# Patient Record
Sex: Female | Born: 1945
Health system: Southern US, Community
[De-identification: ages and names within clinical notes are randomized; demographics above are authoritative.]

## PROBLEM LIST (undated history)

## (undated) DIAGNOSIS — L259 Unspecified contact dermatitis, unspecified cause: Secondary | ICD-10-CM

## (undated) DIAGNOSIS — U071 COVID-19: Secondary | ICD-10-CM

## (undated) DIAGNOSIS — D128 Benign neoplasm of rectum: Secondary | ICD-10-CM

## (undated) DIAGNOSIS — E039 Hypothyroidism, unspecified: Secondary | ICD-10-CM

## (undated) DIAGNOSIS — M8580 Other specified disorders of bone density and structure, unspecified site: Secondary | ICD-10-CM

## (undated) DIAGNOSIS — N951 Menopausal and female climacteric states: Secondary | ICD-10-CM

## (undated) DIAGNOSIS — R002 Palpitations: Secondary | ICD-10-CM

## (undated) DIAGNOSIS — M949 Disorder of cartilage, unspecified: Secondary | ICD-10-CM

## (undated) DIAGNOSIS — M21619 Bunion of unspecified foot: Secondary | ICD-10-CM

## (undated) DIAGNOSIS — M899 Disorder of bone, unspecified: Secondary | ICD-10-CM

## (undated) DIAGNOSIS — M810 Age-related osteoporosis without current pathological fracture: Secondary | ICD-10-CM

## (undated) DIAGNOSIS — E875 Hyperkalemia: Secondary | ICD-10-CM

## (undated) DIAGNOSIS — Z86010 Personal history of colonic polyps: Secondary | ICD-10-CM

## (undated) DIAGNOSIS — H269 Unspecified cataract: Secondary | ICD-10-CM

## (undated) DIAGNOSIS — R9389 Abnormal findings on diagnostic imaging of other specified body structures: Secondary | ICD-10-CM

## (undated) DIAGNOSIS — N393 Stress incontinence (female) (male): Secondary | ICD-10-CM

## (undated) DIAGNOSIS — D129 Benign neoplasm of anus and anal canal: Secondary | ICD-10-CM

## (undated) DIAGNOSIS — T7840XA Allergy, unspecified, initial encounter: Secondary | ICD-10-CM

## (undated) DIAGNOSIS — M1990 Unspecified osteoarthritis, unspecified site: Secondary | ICD-10-CM

## (undated) DIAGNOSIS — D649 Anemia, unspecified: Secondary | ICD-10-CM

## (undated) DIAGNOSIS — E785 Hyperlipidemia, unspecified: Secondary | ICD-10-CM

## (undated) DIAGNOSIS — K219 Gastro-esophageal reflux disease without esophagitis: Secondary | ICD-10-CM

## (undated) HISTORY — PX: FRACTURE SURGERY: SHX138

## (undated) HISTORY — DX: Benign neoplasm of rectum: D12.8

## (undated) HISTORY — DX: Disorder of cartilage, unspecified: M94.9

## (undated) HISTORY — PX: POLYPECTOMY: SHX149

## (undated) HISTORY — DX: Hypothyroidism, unspecified: E03.9

## (undated) HISTORY — DX: Stress incontinence (female) (male): N39.3

## (undated) HISTORY — DX: Unspecified contact dermatitis, unspecified cause: L25.9

## (undated) HISTORY — DX: Bunion of unspecified foot: M21.619

## (undated) HISTORY — PX: EYE SURGERY: SHX253

## (undated) HISTORY — DX: Unspecified osteoarthritis, unspecified site: M19.90

## (undated) HISTORY — DX: Palpitations: R00.2

## (undated) HISTORY — DX: Hyperlipidemia, unspecified: E78.5

## (undated) HISTORY — DX: Benign neoplasm of anus and anal canal: D12.9

## (undated) HISTORY — DX: Disorder of bone, unspecified: M89.9

## (undated) HISTORY — DX: Unspecified cataract: H26.9

## (undated) HISTORY — PX: OTHER SURGICAL HISTORY: SHX169

## (undated) HISTORY — DX: Anemia, unspecified: D64.9

## (undated) HISTORY — DX: Hyperkalemia: E87.5

## (undated) HISTORY — DX: Menopausal and female climacteric states: N95.1

## (undated) HISTORY — DX: Allergy, unspecified, initial encounter: T78.40XA

## (undated) HISTORY — DX: Gastro-esophageal reflux disease without esophagitis: K21.9

## (undated) HISTORY — DX: Abnormal findings on diagnostic imaging of other specified body structures: R93.89

## (undated) HISTORY — DX: Personal history of colonic polyps: Z86.010

## (undated) HISTORY — PX: HAND SURGERY: SHX662

## (undated) HISTORY — DX: Other specified disorders of bone density and structure, unspecified site: M85.80

## (undated) HISTORY — PX: COLONOSCOPY: SHX174

## (undated) HISTORY — DX: Age-related osteoporosis without current pathological fracture: M81.0

---

## 1986-10-18 HISTORY — PX: ABDOMINAL HYSTERECTOMY: SHX81

## 2004-10-03 ENCOUNTER — Encounter: Payer: Self-pay | Admitting: Family Medicine

## 2005-02-09 ENCOUNTER — Encounter: Admission: RE | Admit: 2005-02-09 | Discharge: 2005-02-09 | Payer: Self-pay | Admitting: Internal Medicine

## 2005-03-18 HISTORY — PX: FRACTURE SURGERY: SHX138

## 2005-03-18 HISTORY — PX: OPEN REDUCTION INTERNAL FIXATION (ORIF) DISTAL RADIAL FRACTURE: SHX5989

## 2005-03-25 ENCOUNTER — Ambulatory Visit (HOSPITAL_COMMUNITY): Admission: RE | Admit: 2005-03-25 | Discharge: 2005-03-26 | Payer: Self-pay | Admitting: Orthopedic Surgery

## 2005-09-21 ENCOUNTER — Other Ambulatory Visit: Admission: RE | Admit: 2005-09-21 | Discharge: 2005-09-21 | Payer: Self-pay | Admitting: Obstetrics and Gynecology

## 2006-02-15 ENCOUNTER — Encounter: Admission: RE | Admit: 2006-02-15 | Discharge: 2006-02-15 | Payer: Self-pay | Admitting: Obstetrics and Gynecology

## 2006-09-26 ENCOUNTER — Encounter: Payer: Self-pay | Admitting: Family Medicine

## 2006-09-29 ENCOUNTER — Ambulatory Visit: Payer: Self-pay | Admitting: Cardiology

## 2007-02-23 ENCOUNTER — Encounter: Admission: RE | Admit: 2007-02-23 | Discharge: 2007-02-23 | Payer: Self-pay | Admitting: Obstetrics and Gynecology

## 2007-03-01 LAB — CONVERTED CEMR LAB: Pap Smear: NORMAL

## 2007-07-18 ENCOUNTER — Ambulatory Visit: Payer: Self-pay | Admitting: Family Medicine

## 2007-07-18 DIAGNOSIS — E039 Hypothyroidism, unspecified: Secondary | ICD-10-CM

## 2007-07-18 HISTORY — DX: Hypothyroidism, unspecified: E03.9

## 2007-08-30 ENCOUNTER — Ambulatory Visit: Payer: Self-pay | Admitting: Family Medicine

## 2007-10-19 HISTORY — PX: RETINAL DETACHMENT SURGERY: SHX105

## 2008-02-07 ENCOUNTER — Other Ambulatory Visit: Admission: RE | Admit: 2008-02-07 | Discharge: 2008-02-07 | Payer: Self-pay | Admitting: Family Medicine

## 2008-02-07 ENCOUNTER — Ambulatory Visit: Payer: Self-pay | Admitting: Family Medicine

## 2008-02-07 ENCOUNTER — Encounter: Payer: Self-pay | Admitting: Family Medicine

## 2008-02-07 DIAGNOSIS — N951 Menopausal and female climacteric states: Secondary | ICD-10-CM

## 2008-02-07 DIAGNOSIS — R002 Palpitations: Secondary | ICD-10-CM | POA: Insufficient documentation

## 2008-02-07 DIAGNOSIS — M21619 Bunion of unspecified foot: Secondary | ICD-10-CM

## 2008-02-07 HISTORY — DX: Menopausal and female climacteric states: N95.1

## 2008-02-08 ENCOUNTER — Encounter: Payer: Self-pay | Admitting: Family Medicine

## 2008-02-08 ENCOUNTER — Encounter (INDEPENDENT_AMBULATORY_CARE_PROVIDER_SITE_OTHER): Payer: Self-pay | Admitting: *Deleted

## 2008-02-13 ENCOUNTER — Encounter (INDEPENDENT_AMBULATORY_CARE_PROVIDER_SITE_OTHER): Payer: Self-pay | Admitting: *Deleted

## 2008-02-14 ENCOUNTER — Encounter (INDEPENDENT_AMBULATORY_CARE_PROVIDER_SITE_OTHER): Payer: Self-pay | Admitting: *Deleted

## 2008-02-22 ENCOUNTER — Encounter (INDEPENDENT_AMBULATORY_CARE_PROVIDER_SITE_OTHER): Payer: Self-pay | Admitting: *Deleted

## 2008-02-22 ENCOUNTER — Ambulatory Visit: Payer: Self-pay | Admitting: Family Medicine

## 2008-02-22 ENCOUNTER — Ambulatory Visit: Payer: Self-pay

## 2008-02-22 LAB — CONVERTED CEMR LAB: OCCULT 1: NEGATIVE

## 2008-03-06 ENCOUNTER — Encounter: Payer: Self-pay | Admitting: Family Medicine

## 2008-03-13 ENCOUNTER — Encounter: Admission: RE | Admit: 2008-03-13 | Discharge: 2008-03-13 | Payer: Self-pay | Admitting: Family Medicine

## 2008-03-13 ENCOUNTER — Encounter: Payer: Self-pay | Admitting: Family Medicine

## 2008-03-24 DIAGNOSIS — M899 Disorder of bone, unspecified: Secondary | ICD-10-CM | POA: Insufficient documentation

## 2008-03-24 DIAGNOSIS — M949 Disorder of cartilage, unspecified: Secondary | ICD-10-CM

## 2008-03-24 HISTORY — DX: Disorder of bone, unspecified: M89.9

## 2008-03-25 ENCOUNTER — Encounter (INDEPENDENT_AMBULATORY_CARE_PROVIDER_SITE_OTHER): Payer: Self-pay | Admitting: *Deleted

## 2008-03-25 ENCOUNTER — Telehealth (INDEPENDENT_AMBULATORY_CARE_PROVIDER_SITE_OTHER): Payer: Self-pay | Admitting: *Deleted

## 2008-07-31 ENCOUNTER — Ambulatory Visit: Payer: Self-pay | Admitting: Family Medicine

## 2009-03-25 ENCOUNTER — Encounter: Admission: RE | Admit: 2009-03-25 | Discharge: 2009-03-25 | Payer: Self-pay | Admitting: Family Medicine

## 2009-05-06 ENCOUNTER — Ambulatory Visit: Payer: Self-pay | Admitting: Family Medicine

## 2009-05-06 LAB — CONVERTED CEMR LAB
Protein, U semiquant: NEGATIVE
Urobilinogen, UA: NEGATIVE
WBC Urine, dipstick: NEGATIVE

## 2009-05-07 ENCOUNTER — Encounter: Payer: Self-pay | Admitting: Family Medicine

## 2009-05-15 ENCOUNTER — Encounter (INDEPENDENT_AMBULATORY_CARE_PROVIDER_SITE_OTHER): Payer: Self-pay | Admitting: *Deleted

## 2009-05-18 LAB — CONVERTED CEMR LAB
ALT: 17 units/L (ref 0–35)
AST: 25 units/L (ref 0–37)
Alkaline Phosphatase: 61 units/L (ref 39–117)
Basophils Relative: 0.5 % (ref 0.0–3.0)
Bilirubin, Direct: 0.1 mg/dL (ref 0.0–0.3)
Chloride: 107 meq/L (ref 96–112)
Creatinine, Ser: 0.8 mg/dL (ref 0.4–1.2)
Direct LDL: 184.8 mg/dL
Eosinophils Relative: 2 % (ref 0.0–5.0)
HCT: 36.6 % (ref 36.0–46.0)
MCV: 93.5 fL (ref 78.0–100.0)
Monocytes Absolute: 0.3 10*3/uL (ref 0.1–1.0)
Monocytes Relative: 5 % (ref 3.0–12.0)
Neutrophils Relative %: 62.1 % (ref 43.0–77.0)
Potassium: 4.4 meq/L (ref 3.5–5.1)
RBC: 3.91 M/uL (ref 3.87–5.11)
Total CHOL/HDL Ratio: 3
Total Protein: 7.6 g/dL (ref 6.0–8.3)
VLDL: 13.2 mg/dL (ref 0.0–40.0)
WBC: 5.1 10*3/uL (ref 4.5–10.5)

## 2009-06-03 ENCOUNTER — Ambulatory Visit: Payer: Self-pay | Admitting: Family Medicine

## 2009-06-03 LAB — CONVERTED CEMR LAB: OCCULT 3: NEGATIVE

## 2009-06-05 ENCOUNTER — Encounter (INDEPENDENT_AMBULATORY_CARE_PROVIDER_SITE_OTHER): Payer: Self-pay | Admitting: *Deleted

## 2009-10-18 HISTORY — PX: CATARACT EXTRACTION: SUR2

## 2009-11-26 ENCOUNTER — Ambulatory Visit: Payer: Self-pay | Admitting: Family Medicine

## 2009-11-26 DIAGNOSIS — L259 Unspecified contact dermatitis, unspecified cause: Secondary | ICD-10-CM

## 2009-12-01 LAB — CONVERTED CEMR LAB: TSH: 1.98 microintl units/mL (ref 0.35–5.50)

## 2010-04-06 ENCOUNTER — Encounter: Admission: RE | Admit: 2010-04-06 | Discharge: 2010-04-06 | Payer: Self-pay | Admitting: Family Medicine

## 2010-09-21 ENCOUNTER — Ambulatory Visit: Payer: Self-pay | Admitting: Family Medicine

## 2010-09-21 ENCOUNTER — Other Ambulatory Visit
Admission: RE | Admit: 2010-09-21 | Discharge: 2010-09-21 | Payer: Self-pay | Source: Home / Self Care | Admitting: Family Medicine

## 2010-09-21 ENCOUNTER — Encounter: Payer: Self-pay | Admitting: Family Medicine

## 2010-09-23 ENCOUNTER — Ambulatory Visit: Payer: Self-pay | Admitting: Family Medicine

## 2010-09-23 ENCOUNTER — Telehealth: Payer: Self-pay | Admitting: Family Medicine

## 2010-09-30 ENCOUNTER — Encounter: Payer: Self-pay | Admitting: Family Medicine

## 2010-10-02 ENCOUNTER — Telehealth (INDEPENDENT_AMBULATORY_CARE_PROVIDER_SITE_OTHER): Payer: Self-pay | Admitting: *Deleted

## 2010-10-02 LAB — CONVERTED CEMR LAB
Chloride: 106 meq/L (ref 96–112)
GFR calc non Af Amer: 100.39 mL/min (ref >60.00)
Pap Smear: NEGATIVE
Potassium: 5.4 meq/L — ABNORMAL HIGH (ref 3.5–5.1)
Sodium: 142 meq/L (ref 135–145)
Vit D, 25-Hydroxy: 49 ng/mL (ref 30–89)

## 2010-10-09 ENCOUNTER — Encounter: Payer: Self-pay | Admitting: Family Medicine

## 2010-10-14 ENCOUNTER — Ambulatory Visit: Payer: Self-pay | Admitting: Family Medicine

## 2010-10-14 DIAGNOSIS — E875 Hyperkalemia: Secondary | ICD-10-CM | POA: Insufficient documentation

## 2010-10-15 ENCOUNTER — Telehealth (INDEPENDENT_AMBULATORY_CARE_PROVIDER_SITE_OTHER): Payer: Self-pay | Admitting: *Deleted

## 2010-10-15 LAB — CONVERTED CEMR LAB: Potassium: 5.5 meq/L — ABNORMAL HIGH (ref 3.5–5.1)

## 2010-10-20 ENCOUNTER — Telehealth (INDEPENDENT_AMBULATORY_CARE_PROVIDER_SITE_OTHER): Payer: Self-pay | Admitting: *Deleted

## 2010-10-27 ENCOUNTER — Other Ambulatory Visit: Payer: Self-pay | Admitting: Family Medicine

## 2010-10-27 ENCOUNTER — Ambulatory Visit
Admission: RE | Admit: 2010-10-27 | Discharge: 2010-10-27 | Payer: Self-pay | Source: Home / Self Care | Attending: Family Medicine | Admitting: Family Medicine

## 2010-10-27 DIAGNOSIS — E785 Hyperlipidemia, unspecified: Secondary | ICD-10-CM | POA: Insufficient documentation

## 2010-10-27 DIAGNOSIS — N393 Stress incontinence (female) (male): Secondary | ICD-10-CM | POA: Insufficient documentation

## 2010-10-27 HISTORY — DX: Hyperlipidemia, unspecified: E78.5

## 2010-10-27 LAB — CONVERTED CEMR LAB
Bilirubin Urine: NEGATIVE
Glucose, Urine, Semiquant: NEGATIVE
pH: 7

## 2010-10-27 LAB — BASIC METABOLIC PANEL
BUN: 16 mg/dL (ref 6–23)
CO2: 29 mEq/L (ref 19–32)
Calcium: 9.6 mg/dL (ref 8.4–10.5)
Chloride: 103 mEq/L (ref 96–112)
Creatinine, Ser: 0.7 mg/dL (ref 0.4–1.2)
GFR: 91.9 mL/min (ref >60.00)
Glucose, Bld: 85 mg/dL (ref 70–99)
Potassium: 5.2 mEq/L — ABNORMAL HIGH (ref 3.5–5.1)
Sodium: 141 mEq/L (ref 135–145)

## 2010-11-02 ENCOUNTER — Encounter: Payer: Self-pay | Admitting: Family Medicine

## 2010-11-02 ENCOUNTER — Ambulatory Visit: Admission: RE | Admit: 2010-11-02 | Discharge: 2010-11-02 | Payer: Self-pay | Source: Home / Self Care

## 2010-11-02 ENCOUNTER — Ambulatory Visit (HOSPITAL_COMMUNITY)
Admission: RE | Admit: 2010-11-02 | Discharge: 2010-11-02 | Payer: Self-pay | Source: Home / Self Care | Attending: Family Medicine | Admitting: Family Medicine

## 2010-11-02 DIAGNOSIS — R9389 Abnormal findings on diagnostic imaging of other specified body structures: Secondary | ICD-10-CM | POA: Insufficient documentation

## 2010-11-15 LAB — CONVERTED CEMR LAB
Albumin: 4.3 g/dL (ref 3.5–5.2)
BUN: 14 mg/dL (ref 6–23)
Bilirubin Urine: NEGATIVE
Calcium: 9.7 mg/dL (ref 8.4–10.5)
Cholesterol: 279 mg/dL (ref 0–200)
Eosinophils Relative: 1.1 % (ref 0.0–5.0)
GFR calc Af Amer: 130 mL/min
Glucose, Bld: 82 mg/dL (ref 70–99)
Glucose, Urine, Semiquant: NEGATIVE
HCT: 38.4 % (ref 36.0–46.0)
Hemoglobin: 12.8 g/dL (ref 12.0–15.0)
MCV: 94.9 fL (ref 78.0–100.0)
Monocytes Absolute: 0.5 10*3/uL (ref 0.1–1.0)
Monocytes Relative: 11 % (ref 3.0–12.0)
Neutro Abs: 2.7 10*3/uL (ref 1.4–7.7)
RDW: 12.3 % (ref 11.5–14.6)
Sodium: 141 meq/L (ref 135–145)
Specific Gravity, Urine: <1.005
TSH: 2.23 microintl units/mL (ref 0.35–5.50)
Total CHOL/HDL Ratio: 2.8
Total Protein: 7.6 g/dL (ref 6.0–8.3)
Urobilinogen, UA: NEGATIVE

## 2010-11-17 NOTE — Assessment & Plan Note (Signed)
Summary: CPX//PH   Vital Signs:  Patient profile:   65 year old female Menstrual status:  hysterectomy Height:      64 inches Weight:      140.0 pounds BMI:     24.12 Temp:     98.1 degrees F oral Pulse rate:   60 / minute Pulse rhythm:   regular BP sitting:   112 / 70  (right arm) Cuff size:   regular  Vitals Entered By: Almeta Monas CMA Duncan Dull) October 15, 2010 1:12 PM) CC:  CPX with pap--wants to discuss constipation  Does patient need assistance? Functional Status Self care, Cook/clean, Shopping, Social activities Ambulation Normal Comments pt is able to do all ADLs and can read and write.   Vision Screening:      Vision Comments: optho---q1y 40db HL: Left  Right  Audiometry Comment: grossly normal      Menstrual Status hysterectomy Last PAP Result Normal   History of Present Illness: Pt here for cpe and pap.  Pt c/o chronic constipation.  Pt is drinking a lot of water and is eating prunes,  fiber etc.   Pt states BM are like pellets.     Preventive Screening-Counseling & Management  Alcohol-Tobacco     Alcohol drinks/day: <1     Alcohol type: WINE -OCCASIONALLY     Smoking Status: never     Passive Smoke Exposure: no  Caffeine-Diet-Exercise     Caffeine use/day: 1-1 1/2     Does Patient Exercise: yes     Type of exercise: walking     Exercise (avg: min/session): 30-60     Times/week: 3  Hep-HIV-STD-Contraception     HIV Risk: no     Dental Visit-last 6 months yes     Dental Care Counseling: not indicated; dental care within six months     SBE monthly: yes  Safety-Violence-Falls     Seat Belt Use: yes     Firearms in the Home: no firearms in the home     Smoke Detectors: yes     Violence in the Home: no risk noted     Sexual Abuse: no     Fall Risk: no      Sexual History:  currently monogamous.        Drug Use:  never.    Problems Prior to Update: 1)  Dermatitis  (ICD-692.9) 2)  Eczema  (ICD-692.9) 3)  Osteopenia  (ICD-733.90) 4)   Preventive Health Care  (ICD-V70.0) 5)  Postmenopausal Status  (ICD-627.2) 6)  Preventive Health Care  (ICD-V70.0) 7)  Palpitations, Occasional  (ICD-785.1) 8)  Bunion, Right Foot  (ICD-727.1) 9)  Family History Osteoporosis  (ICD-V17.8) 10)  Family History Diabetes 1st Degree Relative  (ICD-V18.0) 11)  Family History Breast Cancer 1st Degree Relative <50  (ICD-V16.3) 12)  Hypothyroidism  (ICD-244.9)  Medications Prior to Update: 1)  Levothroid 50 Mcg  Tabs (Levothyroxine Sodium) .Marland Kitchen.. 1 By Mouth Once Daily  Current Medications (verified): 1)  Levothroid 50 Mcg  Tabs (Levothyroxine Sodium) .Marland Kitchen.. 1 By Mouth Once Daily  Allergies (verified): 1)  ! Sulfa  Past History:  Past Surgical History: Last updated: 05/06/2009 Hysterectomy:1986 2 NATURAL BIRTHS-69' 72' LEFT WRIST SURGERY 2006 retina surgery L eye -- 01/23/2009--[facility]  Family History: Last updated: 2010-10-15 Mother : deceased  60 yo  FATHER:DECEASED 1 BROTHER:LIVING HEART: FATHER-OPEN-HEART, UNCLES (MOTHER'S SIDE) , MOTHER, MGM,MGF,PGF Family History Breast cancer 1st degree relative <50: 2 AUNTS (MOTHER'S SISTER) Family History Lung cancer: AUNT-MOTHER'S SIDE Family  History Diabetes 1st degree relative: MOTHER, 2 AUNTS (MOTHER'S SIDE) Family History Hypertension: MOTHER Family History High cholesterol:MOTHER Family History Thyroid disease:MOTHER Family History Osteoporosis: AUNT MOTHER'S SIDE M-- dementia Family History of Stroke F 1st degree relative  Social History: Last updated: 07/18/2007 Retired--teacher and Production designer, theatre/television/film Married Never Smoked Alcohol use-yes Drug use-no Regular exercise-yes  Risk Factors: Alcohol Use: <1 (09/21/2010) Caffeine Use: 1-1 1/2 (09/21/2010) Exercise: yes (09/21/2010)  Risk Factors: Smoking Status: never (09/21/2010) Passive Smoke Exposure: no (09/21/2010)  Past Medical History: Reviewed history from 03/24/2008 and no changes  required. Hypothyroidism Osteopenia  Family History: Reviewed history from 05/06/2009 and no changes required. 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 Eyes:  Denies blurring, discharge, double vision, eye irritation, eye pain, halos, itching, light sensitivity, red eye, vision loss-1 eye, and vision loss-both eyes. ENT:  Denies decreased hearing, difficulty swallowing, ear discharge, earache, hoarseness, nasal congestion, nosebleeds, postnasal drainage, ringing in ears, sinus pressure, and sore throat. CV:  Denies bluish discoloration of lips or nails, chest pain or discomfort, difficulty breathing at night, difficulty breathing while lying down, fainting, fatigue, leg cramps with exertion, lightheadness, near fainting, palpitations, shortness of breath with exertion, swelling of feet, swelling of hands, and weight gain. Resp:  Denies chest discomfort, chest pain with inspiration, cough, coughing up blood, excessive snoring, hypersomnolence, morning headaches, pleuritic, shortness of breath, sputum productive, and wheezing. GI:   Complains of constipation; denies abdominal pain, bloody stools, change in bowel habits, dark tarry stools, diarrhea, excessive appetite, gas, hemorrhoids, indigestion, loss of appetite, nausea, vomiting, vomiting blood, and yellowish skin color. GU:  Denies abnormal vaginal bleeding, decreased libido, discharge, dysuria, genital sores, hematuria, incontinence, nocturia, urinary frequency, and urinary hesitancy. MS:  Complains of joint pain; denies joint redness, joint swelling, loss of strength, low back pain, mid back pain, muscle aches, muscle , cramps, muscle weakness, stiffness, and thoracic pain; bunions R foot. Derm:  Denies changes in color of skin, changes in nail beds, dryness, excessive perspiration, flushing, hair loss, insect bite(s), itching, lesion(s), poor wound healing, and rash. Neuro:  Denies brief paralysis, difficulty with concentration, disturbances in coordination, falling down, headaches, inability to speak, memory loss, numbness, poor balance, seizures, sensation of room spinning, tingling, tremors, visual disturbances, and weakness. Psych:  Denies alternate hallucination ( auditory/visual), anxiety, depression, easily angered, easily tearful, irritability, mental problems, panic attacks, sense of great danger, suicidal thoughts/plans, thoughts of violence, unusual visions or sounds, and thoughts /plans of harming others. Endo:  Denies cold intolerance, excessive hunger, excessive thirst, excessive urination, heat intolerance, polyuria, and weight change. Heme:  Denies abnormal bruising, bleeding, enlarge lymph nodes, fevers, pallor, and skin discoloration. Allergy:  Denies hives or rash, itching eyes, persistent infections, seasonal allergies, and sneezing.  Physical Exam  General:  Well-developed,well-nourished,in no acute distress;  alert,appropriate and cooperative throughout examination Head:  Normocephalic and atraumatic without obvious abnormalities. No apparent alopecia  or balding. Eyes:  pupils equal, pupils round, pupils reactive to light, and no injection.   Ears:  External ear exam shows no significant lesions or deformities.  Otoscopic examination reveals clear canals, tympanic membranes are intact bilaterally without bulging, retraction, inflammation or discharge. Hearing is grossly normal bilaterally. Nose:  External nasal examination shows no deformity or inflammation. Nasal mucosa are pink and moist without lesions or exudates. Mouth:  Oral mucosa and oropharynx without lesions or exudates.  Teeth in good repair. Neck:  No deformities, masses, or tenderness noted. Chest Wall:  No deformities, masses, or tenderness noted. Breasts:  No mass, nodules, thickening, tenderness, bulging, retraction, inflamation, nipple discharge or skin changes noted.   Lungs:  Normal respiratory effort, chest expands symmetrically. Lungs are clear to auscultation, no crackles or wheezes. Heart:  normal rate and no murmur.   Abdomen:  Bowel sounds positive,abdomen soft and non-tender without masses, organomegaly or hernias noted. Rectal:  No external abnormalities noted. Normal sphincter tone. No rectal masses or tenderness. Genitalia:  normal introitus, no external lesions, no vaginal discharge, mucosa pink and moist, no friaility or hemorrhage, and no adnexal masses or tenderness.   vaginal smear done Msk:  normal ROM, no joint tenderness, no joint swelling, no joint warmth, no redness over joints, no joint deformities, no joint instability, no crepitation, and no muscle atrophy.   Pulses:  R and L carotid,radial,femoral,dorsalis pedis and posterior tibial pulses are full and equal bilaterally Extremities:  No clubbing, cyanosis, edema, or deformity noted with normal full range of motion of all joints.   Neurologic:  No cranial nerve deficits noted. Station and gait are normal. Plantar reflexes are down-going bilaterally. DTRs are symmetrical throughout. Sensory, motor and  coordinative functions appear intact. Skin:  Intact without suspicious lesions or rashes Cervical Nodes:  No lymphadenopathy noted Axillary Nodes:  No palpable lymphadenopathy Psych:  Cognition and judgment appear intact. Alert and cooperative with normal attention span and concentration. No apparent delusions, illusions, hallucinations   Impression & Recommendations:  Problem # 1:  PREVENTIVE HEALTH CARE (ICD-V70.0)  Ghm utd  check fasting labs  Orders: Medicare -1st Annual Wellness Visit [id number]) EKG w/ Interpretation (93000)  Problem # 2:  OSTEOPENIA (ICD-733.90)  Vit D:38 (05/06/2009)  Orders: Medicare -1st Annual Wellness Visit [phone]) EKG w/ Interpretation (93000)  Problem # 3:  HYPOTHYROIDISM (ICD-244.9)  Her updated medication list for this problem includes:    Levothroid 50 Mcg Tabs (Levothyroxine sodium) .Marland Kitchen... 1 by mouth once daily  Labs Reviewed: TSH: 1.98 (11/26/2009)    Chol: 300 (05/06/2009)   HDL: 96.80 (05/06/2009)   LDL: DEL (02/07/2008)   TG: 66.0 (05/06/2009)  Orders: Medicare -1st Annual Wellness Visit [phone]) EKG w/ Interpretation (93000)  Complete Medication List: 1)  Levothroid 50 Mcg Tabs (Levothyroxine sodium) .Marland Kitchen.. 1 by mouth once daily  Other Orders: Pneumococcal Vaccine (95621) Admin 1st Vaccine (30865)  Patient Instructions: 1)  V70.0 ,  244.9, 272.4 [facility], [facility],   osteopenia --[facility] D   Orders Added: 1)  Pneumococcal Vaccine [90732] 2)  Admin 1st Vaccine [90471] 3)  Medicare -1st Annual Wellness Visit [G0438] 4)  EKG w/ Interpretation [93000]   Immunizations Administered:  Pneumonia Vaccine:    Vaccine Type: Pneumovax    Site: left deltoid    Mfr: Merck    Dose: 0.5 ml    Route: IM    Given by: Almeta Monas  CMA (AAMA)    Exp. Date: 02/17/2012    Lot #: [id number]    VIS given: 09/22/09 version given September 21, 2010.   Immunizations Administered:  Pneumonia Vaccine:    Vaccine Type: Pneumovax    Site: left  deltoid    Mfr: Merck    Dose: 0.5 ml    Route: IM    Given by: Almeta Monas CMA (AAMA)    Exp. Date: 02/17/2012    Lot #: [id number]    VIS given: 09/22/09 version given September 21, 2010.  Last Flu Vaccine:  Fluvax 3+ (07/31/2008 9:13:36 AM) Flu Vaccine Result Date:  09/02/2010 Flu Vaccine Result:  given Flu Vaccine Next Due:  1 yr Last Mammogram:  ASSESSMENT: Negative - BI-RADS 1^MM DIGITAL SCREENING (04/06/2010 11:29:00 AM) Mammogram Result Date:  04/06/2010 Mammogram Result:  normal Mammogram Next Due:  1 yr        Past Medical History:    Reviewed history from 03/24/2008 and no changes required:       Hypothyroidism       Osteopenia    Laboratory Results

## 2010-11-17 NOTE — Progress Notes (Signed)
----   Converted from flag ---- ---- 09/21/2010 2:06 PM, Almeta Monas CMA (AAMA) wrote: stool card neg  ---- 09/21/2010 1:42 PM, Loreen Freud DO wrote: pneumonia vaccine ------------------------------

## 2010-11-17 NOTE — Assessment & Plan Note (Signed)
Summary: FOR A SPOT ON HER FACE//PH   Vital Signs:  Patient profile:   65 year old female Weight:      136 pounds Temp:     98.1 degrees F oral Pulse rate:   60 / minute Pulse rhythm:   regular BP sitting:   122 / 80  (left arm) Cuff size:   regular  Vitals Entered By: Army Fossa CMA (November 26, 2009 10:47 AM) CC: Pt c/o dry, chapped spot on her face that seems to be spreading x 2 weeks. , Rash   History of Present Illness:  Rash      This is a 65 year old woman who presents with Rash.  The symptoms began 3 weeks ago.  The patient complains of macules and itching, but denies papules, nodules, hives, welts, pustules, blisters, ulcers, scaling, weeping, oozing, redness, increased warmth, and tenderness.  The rash is located on the face.  The rash is better with heat.  The patient denies the following symptoms: fever, headache, facial swelling, tongue swelling, burning, difficulty breathing, abdominal pain, nausea, vomiting, diarrhea, dizziness, sore throat, dysuria, eye symptoms, arthralgias, and vaginal discharge.  The patient denies history of recent tick bite, recent tick exposure, other insect bite, recent infection, recent antibiotic use, new medication, new clothing, new topical exposure, recent travel, pet/animal contact, thyroid disease, chronic liver disease, autoimmune disease, chronic edema, and prior STD.  Pt has used neosporin and H202 with no relief.   Pt has appoint with Derm but it is not until March.    Current Medications (verified): 1)  Levothroid 50 Mcg  Tabs (Levothyroxine Sodium) .Marland Kitchen.. 1 By Mouth Once Daily 2)  Elocon 0.1 % Crea (Mometasone Furoate) .... Apply Once Daily  Allergies: 1)  ! Sulfa  Past History:  Past medical, surgical, family and social histories (including risk factors) reviewed for relevance to current acute and chronic problems.  Past Medical History: Reviewed history from 03/24/2008 and no changes  required. Hypothyroidism Osteopenia  Past Surgical History: Reviewed history from 05/06/2009 and no changes required. Hysterectomy:1986 2 NATURAL BIRTHS-69' 72' LEFT WRIST SURGERY 2006 retina surgery L eye -- 01/23/2009--[facility]  Family History: Reviewed history from 05/06/2009 and no changes required. 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     Impression & Recommendations:  Problem # 1:  DERMATITIS (ICD-692.9)  Her updated medication list for this problem includes:    Elocon 0.1 % Crea (Mometasone furoate) .Marland Kitchen... Apply once daily  Discussed avoidance of triggers and symptomatic treatment.   Problem # 2:  HYPOTHYROIDISM (ICD-244.9)  Her updated medication list for this problem includes:    Levothroid 50 Mcg Tabs (Levothyroxine sodium) .Marland Kitchen... 1 by mouth once daily  Orders: Venipuncture (47425) TLB-TSH (Thyroid Stimulating Hormone) (84443-TSH)  Complete Medication List: 1)  Levothroid 50  Mcg Tabs (Levothyroxine sodium) .Marland Kitchen.. 1 by mouth once daily 2)  Elocon 0.1 % Crea  (Mometasone furoate) .... Apply once daily Prescriptions: ELOCON 0.1 % CREA (MOMETASONE FUROATE) apply once daily  #45g x 1   Entered and Authorized by:   Loreen Freud DO   Signed by:   Loreen Freud DO on 11/26/2009   Method used:   Electronically to        [id number] (retail)       [address]       [place], [place]  [address]       Ph: [phone] or [phone]       Fax: [phone]   RxID:   [id number]   Prevention & Chronic Care Immunizations   Influenza vaccine: Fluvax 3+  (07/31/2008)   Influenza vaccine due: 07/31/2009    Tetanus booster: 02/07/2008: Td   Tetanus booster due: 02/06/2018    Pneumococcal vaccine: Not documented    H. zoster vaccine: 02/07/2008: Zostavax  Colorectal Screening   Hemoccult: Not documented    Colonoscopy: Normal  (12/22/2001)   Colonoscopy due: 12/23/2011  Other Screening   Pap smear: Normal  (03/01/2007)   Pap smear due: 02/29/2008    Mammogram: ASSESSMENT: Negative - BI-RADS 1^MM DIGITAL SCREENING  (03/25/2009)   Mammogram due: 03/25/2010    DXA bone density scan: Not documented   Smoking status: never  (05/06/2009)  Lipids   Total Cholesterol: 300  (05/06/2009)   LDL: DEL  (02/07/2008)   LDL Direct: 184.8  (05/06/2009)   HDL: 96.80  (05/06/2009)   Triglycerides: 66.0  (05/06/2009)

## 2010-11-19 NOTE — Progress Notes (Signed)
Summary: Results  Phone Note Outgoing Call   Caller: Patient Call placed by: Almeta Monas CMA Duncan Dull),  October 15, 2010 4:29 PM Call placed to: Patient Details for Reason: please make sure pt is not taking any supplements with potassium in it and that sample did not sit for long.     Summary of Call: spoke with patient and advised of the above, advise to also be mindful of foods that are high in potassium and we will check and make sure this is not a lab error..... Almeta Monas CMA Duncan Dull)  October 15, 2010 4:34 PM'

## 2010-11-19 NOTE — Progress Notes (Signed)
Summary: Results  Phone Note Outgoing Call   Call placed by: Almeta Monas CMA Duncan Dull),  October 02, 2010 5:03 PM Call placed to: Patient Details for Reason: normal---pt should be taking calcium 1200-1500 mg daily with 1000u vita D3 daily.  is pt taking any otc K supplement?  K slightly high---recheck 2 weeks   Summary of Call: Left message to call back.... Almeta Monas CMA Duncan Dull)  October 02, 2010 5:04 PM  Discuss with patient, appt scheduled...........Marland KitchenFelecia Deloach CMA  October 05, 2010 10:44 AM

## 2010-11-19 NOTE — Progress Notes (Signed)
Summary: Review [facility]  scheduled 10/27/10  ---- Converted from flag ---- ---- 10/20/2010 7:03 AM, Loreen Freud DO wrote: needs ov to discuss labs ------------------------------

## 2010-11-19 NOTE — Assessment & Plan Note (Signed)
Summary: Review [facility]//KP   Vital Signs:  Patient profile:   65 year old female Menstrual status:  hysterectomy Weight:      140.0 pounds Pulse rate:   60 / minute Pulse rhythm:   regular BP sitting:   114 / 62  (right arm) Cuff size:   regular  Vitals Entered By: Almeta Monas CMA Duncan Dull) (October 27, 2010 10:43 AM) CC: REVIEW [facility]   History of Present Illness: Nancy Cuevas here to review labs but has several questions as well.   Nancy Cuevas is concerned about K being high and is c/o urinary frequency that has worsened since last visit.   Nancy Cuevas had bladder tack several years ago with her gyn.  Nancy Cuevas also c/o BM irregularity.  It is better with Mirilax but wonders what else she can do.      Current Medications (verified): 1)  Synthroid 75 Mcg Tabs (Levothyroxine Sodium) .Marland Kitchen.. 1 By Mouth Once Daily 2)  Zetia 10 Mg Tabs (Ezetimibe) .Marland Kitchen.. 1 By Mouth Once Daily  Allergies (verified): 1)  ! Sulfa  Past History:  Past Medical History: Last updated: 03/24/2008 Hypothyroidism Osteopenia  Past Surgical History: Last updated: 05/06/2009 Hysterectomy:1986 2 NATURAL BIRTHS-69' 72' LEFT WRIST SURGERY 2006 retina surgery L eye -- 01/23/2009--[facility]  Family History: Last updated: Oct 17, 2010 Mother : deceased  57 yo  FATHER:DECEASED 1 BROTHER:LIVING HEART: FATHER-OPEN-HEART, UNCLES (MOTHER'S SIDE) , MOTHER, MGM,MGF,PGF Family History Breast cancer 1st degree relative <50: 2 AUNTS (MOTHER'S SISTER) Family History Lung cancer: AUNT-MOTHER'S SIDE Family History Diabetes 1st degree relative: MOTHER, 2 AUNTS (MOTHER'S SIDE) Family History Hypertension: MOTHER Family History High cholesterol:MOTHER Family History Thyroid disease:MOTHER Family History Osteoporosis: AUNT MOTHER'S SIDE M-- dementia Family History of Stroke F 1st degree relative  Social History: Last updated: 07/18/2007 Retired--teacher and Production designer, theatre/television/film Married Never Smoked Alcohol use-yes Drug use-no Regular  exercise-yes  Risk Factors: Alcohol Use: <1 (10-17-10) Caffeine Use: 1-1 1/2 (October 17, 2010) Exercise: yes (2010-10-17)  Risk Factors: Smoking Status: never (2010-10-17) Passive Smoke Exposure: no (10/17/2010)  Family History: Reviewed history from 2010-10-17 and no changes required. Mother : deceased  87 yo  FATHER:DECEASED 1 BROTHER:LIVING HEART: FATHER-OPEN-HEART, UNCLES (MOTHER'S SIDE) , MOTHER, MGM,MGF,PGF Family History Breast cancer 1st degree relative <50: 2 AUNTS (MOTHER'S SISTER) Family History Lung cancer: AUNT-MOTHER'S SIDE Family History Diabetes 1st degree relative: MOTHER, 2 AUNTS (MOTHER'S SIDE) Family History Hypertension: MOTHER Family History High cholesterol:MOTHER Family History Thyroid disease:MOTHER Family History Osteoporosis: AUNT MOTHER'S SIDE M-- dementia Family History of Stroke F 1st degree relative  Social History: Reviewed history from 07/18/2007 and 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 CV:  Denies bluish discoloration of lips or nails, chest pain or discomfort, difficulty breathing at night, difficulty breathing while lying down, fainting, fatigue, leg cramps with exertion, lightheadness, near fainting, palpitations, shortness of breath with exertion, swelling of feet, swelling of hands, and weight gain. Resp:  Denies chest discomfort, chest pain with inspiration, cough, coughing up blood, excessive snoring, hypersomnolence, morning headaches, pleuritic, shortness of breath, sputum productive, and wheezing. Psych:  Denies alternate hallucination ( auditory/visual), anxiety, depression, easily angered, easily tearful, irritability, mental problems, panic attacks, sense of great danger, suicidal thoughts/plans,  thoughts of violence, unusual visions or sounds, and thoughts /plans of harming others.  Physical Exam  General:  Well-developed,well-nourished,in  no acute distress; alert,appropriate and cooperative throughout examination Lungs:  Normal respiratory effort, chest expands symmetrically. Lungs are clear to auscultation, no crackles or wheezes. Heart:  normal rate and no murmur.   Psych:  Cognition and judgment appear intact. Alert and cooperative with normal attention span and concentration. No apparent delusions, illusions, hallucinations   Impression & Recommendations:  Problem # 1:  PALPITATIONS, OCCASIONAL (ICD-785.1)  Orders: Echo Referral (Echo)  Avoid caffeine, chocolate, and stimulants. Stress reduction as well as medication options discussed.   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 1 by mouth once daily  Labs Reviewed: TSH: 1.98 (11/26/2009)    Chol: 300 (05/06/2009)   HDL: 96.80 (05/06/2009)   LDL: DEL (02/07/2008)   TG: 66.0 (05/06/2009)  Problem # 4:  HYPERLIPIDEMIA (ICD-272.4)  Her updated medication list for this problem includes:    Zetia 10 Mg Tabs (Ezetimibe) .Marland Kitchen... 1 by mouth once daily  Labs Reviewed: SGOT: 25 (05/06/2009)   SGPT: 17 (05/06/2009)   HDL:96.80 (05/06/2009), 100.1 (02/07/2008)  LDL:DEL (02/07/2008)  Chol:300 (05/06/2009), 279 (02/07/2008)  Trig:66.0 (05/06/2009), 44 (02/07/2008)  Orders: Prescription Created Electronically [phone])  Problem # 5:  FEMALE STRESS INCONTINENCE (ICD-625.6) vesicare 10 mg once daily urology if no better  Complete Medication List: 1)  Synthroid 75 Mcg Tabs (Levothyroxine sodium) .Marland Kitchen.. 1 by mouth once daily 2)  Zetia 10 Mg  Tabs (Ezetimibe) .Marland Kitchen.. 1 by mouth once daily 3)  Vesicare 10 Mg Tabs (Solifenacin succinate) .Marland Kitchen.. 1 by mouth once daily  Patient Instructions: 1)  labs in 2 months---- 272.4 244.9  TSH,  [facility],  bmp Prescriptions: SYNTHROID 75 MCG TABS (LEVOTHYROXINE SODIUM) 1 by mouth once daily  #30 x 11   Entered and Authorized by:   Loreen Freud DO   Signed by:   Loreen Freud DO on 10/27/2010   Method used:   Electronically to        [id number]* (retail)       [address]       [place], [place]  [address]       Ph: [phone] or [phone]       Fax: [phone]   RxID:   [id number] ZETIA 10 MG TABS (EZETIMIBE) 1 by mouth once daily  #30 x 5   Entered and Authorized by:   Loreen Freud DO   Signed by:   Loreen Freud DO on 10/27/2010   Method used:   Print then Give to Patient   RxID:   [id number]    Orders Added: 1)  Venipuncture [16010] 2)  TLB-BMP (Basic Metabolic Panel-BMET) [80048-METABOL] 3)  Echo Referral [Echo] 4)  Specimen Handling [99000] 5)  Est. Patient Level IV [93235] 6)  Prescription Created Electronically [phone]    Laboratory Results   Urine Tests    Routine Urinalysis   Color: lt. yellow Appearance: Clear Glucose: negative   (Normal Range: Negative) Bilirubin: negative   (Normal Range: Negative) Ketone: negative   (Normal Range: Negative) Spec. Gravity: <1.005   (Normal Range: 1.003-1.035) Blood: small   (Normal Range: Negative) pH: 7.0   (Normal Range: 5.0-8.0) Protein: negative   (Normal Range: Negative) Urobilinogen: 0.2   (Normal Range: 0-1) Nitrite: negative   (  Normal Range: Negative) Leukocyte Esterace: trace   (Normal Range: Negative)

## 2010-11-20 ENCOUNTER — Ambulatory Visit: Admit: 2010-11-20 | Payer: Self-pay | Admitting: Cardiology

## 2010-11-20 ENCOUNTER — Encounter (INDEPENDENT_AMBULATORY_CARE_PROVIDER_SITE_OTHER): Payer: Medicare Other

## 2010-11-20 ENCOUNTER — Ambulatory Visit (INDEPENDENT_AMBULATORY_CARE_PROVIDER_SITE_OTHER): Payer: Medicare Other | Admitting: Cardiology

## 2010-11-20 ENCOUNTER — Encounter: Payer: Self-pay | Admitting: Cardiology

## 2010-11-20 DIAGNOSIS — R002 Palpitations: Secondary | ICD-10-CM

## 2010-11-25 ENCOUNTER — Telehealth: Payer: Self-pay | Admitting: Cardiology

## 2010-11-25 NOTE — Assessment & Plan Note (Addendum)
Summary: NP6/ABNORMAL ECHO-MB/PER RENEE [phone] PT HAVE MEDICARE-MB/A...   Vital Signs:  Patient profile:   65 year old female Menstrual status:  hysterectomy Height:      64 inches Weight:      139 pounds BMI:     23.95 Pulse rate:   65 / minute Pulse rhythm:   regular BP sitting:   140 / 72  (left arm) Cuff size:   regular  Vitals Entered By: Judithe Modest CMA (November 20, 2010 11:24 AM)  Primary Provider:  Dr. Laury Axon  CC:  new patient.  Abnormal Echo.  Pt reports occasional lightheadedness that she says is mild.  Pt also states that her hands and fingers on her left side go numb when she lays on her left side in bed. She is wondering if this has to do with her circulation.  History of Present Illness: 65 yo presents for evaluation of palpitations and abnormal echo.  [name].  She feels a fluttering sensation in her chest.  This sensation lasts < 1 minute at a time.  It happens about 1-2 times a day.  She has had occasional palpitations prior to this last month but less frequent.  She cannot think of anything that has changed in the last month, no increased stress and no increased caffeine.  She drinks 1-2 caffeinated drinks a day.  No syncope or lightheadedness.  No exertional dyspnea or chest pain.  Patient had an echo done as part of the workup for palpitations.  This study was essentially normal except for an atrial septal aneurysm.   ECG: NSR, normal  Labs (12/11): LDL 177, HDL 100, Lp(a) < 15 Labs (4/54): K 5.2, creatinine 0.7  Current Medications (verified): 1)  Synthroid 75 Mcg Tabs (Levothyroxine Sodium) .Marland Kitchen.. 1 By Mouth Once Daily 2)  Zetia 10 Mg Tabs (Ezetimibe) .Marland Kitchen.. 1 By Mouth Once Daily 3)  Aspirin 81 Mg Tabs (Aspirin) .... Once Daily 4)  Multivitamins  Tabs (Multiple Vitamin) .... Take One Tablet Once Daily 5)  Cvs Iron 325 (65 Fe) Mg Tabs (Ferrous Sulfate) .... Once Daily 6)   Calcium-Vitamin D 600-200 Mg-Unit Tabs (Calcium-Vitamin D) .... One Tablet Once Daily 7)  Flax Seed Oil 1000 Mg Caps (Flaxseed (Linseed)) .... Take 2 Capsules Once Daily 8)  Fish Oil 1000 Mg Caps (Omega-3 Fatty Acids) .... One Capsule Once Daily  Allergies (verified): 1)  ! Sulfa  Past History:  Past Medical History: 1. Hypothyroidism 2. Osteopenia 3. Palpitations: Echo (1/12) EF 55-60%, mild diastolic dysfunction, atrial septal aneurysm 4. Hyperlipidemia  Family History: Reviewed history from 09/21/2010 and no changes required. Mother : deceased  63 yo  FATHER:DECEASED 1 BROTHER:LIVING HEART: FATHER-OPEN-HEART, UNCLES (MOTHER'S SIDE) , MOTHER, MGM,MGF,PGF Family History Breast cancer 1st degree relative <50: 2 AUNTS (MOTHER'S SISTER) Family History Lung cancer: AUNT-MOTHER'S SIDE Family History Diabetes 1st degree relative: MOTHER, 2 AUNTS (MOTHER'S SIDE) Family History Hypertension: MOTHER Family History High cholesterol:MOTHER Family History Thyroid disease:MOTHER Family History Osteoporosis: AUNT MOTHER'S SIDE M-- dementia Family History of Stroke F 1st degree relative  Social History: Reviewed history from 07/18/2007 and no changes required. Retired--teacher and administrator Married Never Smoked Alcohol use-yes Drug use-no Regular exercise-yes  Review of Systems       All systems reviewed and negative except as per HPI.   Physical Exam  General:  Well developed, well nourished, in no acute distress. Head:  normocephalic and atraumatic Nose:  no deformity, discharge, inflammation, or lesions Mouth:  Teeth, gums and palate normal. Oral mucosa normal. Neck:  Neck supple, no JVD. No masses, thyromegaly or abnormal cervical nodes. Lungs:  Clear bilaterally to auscultation and percussion. Heart:  Non-displaced PMI, chest non-tender; regular rate and rhythm, S1, S2 without murmurs, rubs or gallops. Carotid upstroke normal, no bruit. Pedals normal pulses. No edema,  no varicosities. Abdomen:  Bowel sounds positive; abdomen soft and non-tender without masses, organomegaly, or hernias noted. No hepatosplenomegaly. Extremities:  No clubbing or cyanosis. Neurologic:  Alert and oriented x 3. Skin:  Intact without lesions or rashes. Psych:  Normal affect.   Impression & Recommendations:  Problem # 1:  ECHOCARDIOGRAM, ABNORMAL (ICD-793.2) Patient has an atrial septal aneurysm on echo.  This finding can increase the risk of stroke when associated with a PFO (no definite PFO detected but bubble study was not done).  There is no indication in this situation to look for a PFO given no stroke or stroke-like symptoms.  I think it would be reasonable for her to take ASA 81 mg daily.   Problem # 2:  PALPITATIONS, OCCASIONAL (ICD-785.1) Most likely PACs or PVCs.  Normal LV systolic function.  WIll get 48 hour holter.   Other Orders: Holter Monitor (Holter Monitor)  Patient Instructions: 1)  Your physician has recommended that you wear a holter monitor.  Holter monitors are medical devices that record the heart's electrical activity. Doctors most often use these monitors to diagnose arrhythmias. Arrhythmias are problems with the speed or rhythm of the heartbeat. The monitor is a small, portable device. You can wear one while you do your normal daily activities. This is usually used to diagnose what is causing palpitations/syncope (passing out).  48 hour 2)  Your physician recommends that you schedule a follow-up appointment as needed with Dr Shirlee Latch.   Orders Added: 1)  Holter Monitor [Holter Monitor]

## 2010-12-09 NOTE — Progress Notes (Signed)
Summary: monitor results  Phone Note Outgoing Call   Call placed by: Katina Dung, RN, BSN,  November 25, 2010 11:43 AM Call placed to: Patient Summary of Call: monitor 11/20/10  Follow-up for Phone Call        11/24/10 Dr Shirlee Latch reviewed monitor done 11/20/10--atrial tachy vs MAT runs--brief,likely cause of palpitations--suggest Toprol XL 25mg  daily--followup -would make 2 weeks after starting Toprol  LMTCB Katina Dung, RN, BSN  November 25, 2010 11:44 AM  pt rtn [phone] Nancy Cuevas  November 25, 2010 2:55 PM--I discussed results with pt--she wanted to think about taking Toprol XL and scheduling followup with Dr Aliene Altes will call me back tomorrow and let me know her her decision about starting Toprol  and followup with Dr Gae Gallop, RN, BSN  November 25, 2010 3:27 PM   Additional Follow-up for Phone Call Additional follow up Details #1::        pt rtn call [phone] Nancy Cuevas  November 26, 2010 4:32 PM  Pt returning call Judie Grieve  November 30, 2010 10:04 AM--I talked with pt--she did not want to start Toprol at present time she prefers to come into office and discuss with Dr Rita Ohara have made pt appt 12/10/10 with Dr Shirlee Latch to discuss monitor results

## 2010-12-09 NOTE — Procedures (Signed)
Summary: summary report  summary report   Imported By: Mirna Mires 12/02/2010 11:39:35  _____________________________________________________________________  External Attachment:    Type:   Image     Comment:   External Document

## 2010-12-10 ENCOUNTER — Encounter: Payer: Self-pay | Admitting: Cardiology

## 2010-12-10 ENCOUNTER — Ambulatory Visit (INDEPENDENT_AMBULATORY_CARE_PROVIDER_SITE_OTHER): Payer: Medicare Other | Admitting: Cardiology

## 2010-12-10 DIAGNOSIS — R002 Palpitations: Secondary | ICD-10-CM

## 2010-12-15 ENCOUNTER — Ambulatory Visit: Payer: Medicare Other | Admitting: Cardiology

## 2010-12-15 NOTE — Assessment & Plan Note (Signed)
Summary: to discuss monitor results   Visit Type:  Follow-up Primary Provider:  Dr. Laury Axon   History of Present Illness: 65 yo presents for followup of palpitations.  At last appointment, she reported a fairly frequent sensation of her heart "fluttering."  She cut her caffeine intake down to 1 cup coffee/day, and they have subsided somewhat.  Holter monitor did show some short runs of atrial tachycardia versus MAT.   No lightheadedness or syncope.  Patient is also concerned about what cholesterol medication she ought to be taking.   Labs (12/11): LDL 177, HDL 100, Lp(a) < 15 Labs (8/65): K 5.2, creatinine 0.7  Current Medications (verified): 1)  Synthroid 75 Mcg Tabs (Levothyroxine Sodium) .Marland Kitchen.. 1 By Mouth Once Daily 2)  Zetia 10 Mg Tabs (Ezetimibe) .Marland Kitchen.. 1 By Mouth Once Daily 3)  Aspirin 81 Mg Tabs (Aspirin) .... Once Daily 4)  Calcium-Vitamin D 600-200 Mg-Unit Tabs (Calcium-Vitamin D) .... One Tablet Once Daily. On Hold 5)  Flax Seed Oil 1000 Mg Caps (Flaxseed (Linseed)) .... Take 2 Capsules Once Daily 6)  Fish Oil 1000 Mg Caps (Omega-3 Fatty Acids) .... One Capsule Once Daily 7)  Biotin 1000 Mcg Tabs (Biotin) .... 2 Capsules Two Times A Day  Allergies: 1)  ! Sulfa  Past History:  Past Medical History: 1. Hypothyroidism 2. Osteopenia 3. Palpitations: Echo (1/12) EF 55-60%, mild diastolic dysfunction, atrial septal aneurysm.  Holter (1/12): short runs of atrial tachycardia versus MAT.  4. Hyperlipidemia  Family History: Reviewed history from 09/21/2010 and no changes required. Mother : deceased  50 yo  FATHER:DECEASED 1 BROTHER:LIVING HEART: FATHER-OPEN-HEART, UNCLES (MOTHER'S SIDE) , MOTHER, MGM,MGF,PGF Family History Breast cancer 1st degree relative <50: 2 AUNTS (MOTHER'S SISTER) Family History Lung cancer: AUNT-MOTHER'S SIDE Family History Diabetes 1st degree relative: MOTHER, 2 AUNTS (MOTHER'S SIDE) Family History Hypertension: MOTHER Family History High  cholesterol:MOTHER Family History Thyroid disease:MOTHER Family History Osteoporosis: AUNT MOTHER'S SIDE M-- dementia Family History of Stroke F 1st degree relative  Social History: Reviewed history from 07/18/2007 and no changes required. Retired--teacher and administrator Married Never Smoked Alcohol use-yes Drug use-no Regular exercise-yes  Review of Systems       All systems reviewed and negative except as per HPI.   Vital Signs:  Patient profile:   65 year old female Menstrual status:  hysterectomy Height:      64 inches Weight:      138.25 pounds BMI:     23.82 Pulse rate:   68 / minute Pulse rhythm:   regular Resp:     18 per minute BP sitting:   138 / 68  (left arm) Cuff size:   large  Vitals Entered By: Vikki Ports (December 10, 2010 12:38 PM)  Physical Exam  General:  Well developed, well nourished, in no acute distress. Neck:  Neck supple, no JVD. No masses, thyromegaly or abnormal cervical nodes. Lungs:  Clear bilaterally to auscultation and percussion. Heart:  Non-displaced PMI, chest non-tender; regular rate and rhythm, S1, S2 without murmurs, rubs or gallops. Carotid upstroke normal, no bruit. Pedals normal pulses. No edema, no varicosities. Abdomen:  Bowel sounds positive; abdomen soft and non-tender without masses, organomegaly, or hernias noted. No hepatosplenomegaly. Extremities:  No clubbing or cyanosis. Neurologic:  Alert and oriented x 3. Psych:  Normal affect.   Impression & Recommendations:  Problem # 1:  PALPITATIONS, OCCASIONAL (ICD-785.1) Holter showed brief runs of atrial tachycardia versus MAT.  Patient actually reports decreased palpitations recently.  She has cut back some  on caffeine.  No significant abnormality other than atrial septal aneurysm on echo.  I offered to start her on Toprol XL to try to suppress the atrial arrhythmias.  We had a long discussion about treatment strategies and opted to not start any medication for now and  to see if she becomes more symptomatic in the future.  She will call us in the future if she feels like she needs to start the Toprol.   Problem # 2:  HYPERLIPIDEMIA (ICD-272.4) Patient is open to taking a statin rather than Zetia.  Given the better data for statins, I will have her stop Zetia and take pravastatin 40 mg daily.  Will get lipids/LFTs in 2 months.   Patient Instructions: 1)  Your physician has recommended you make the following change in your medication:  2)  Start Pravastatin 20mg  daily in the evening. 3)  Fasting lipid/liver profile April 20,2012. 4)  Your physician recommends that you schedule a follow-up appointment as needed with Dr Shirlee Latch .  Prescriptions: PRAVASTATIN SODIUM 20 MG TABS (PRAVASTATIN SODIUM) one daily in evening  #30 x 3   Entered by:   Katina Dung, RN, BSN   Authorized by:   Marca Ancona, MD   Signed by:   Katina Dung, RN, BSN on 12/10/2010   Method used:   Electronically to        [id number]* (retail)       [address]       [place], [place]  [address]       Ph: [phone] or [phone]       Fax: [phone]   RxID:   [id number]

## 2010-12-30 ENCOUNTER — Other Ambulatory Visit (INDEPENDENT_AMBULATORY_CARE_PROVIDER_SITE_OTHER): Payer: Medicare Other

## 2010-12-30 ENCOUNTER — Other Ambulatory Visit: Payer: Self-pay | Admitting: Family Medicine

## 2010-12-30 ENCOUNTER — Encounter (INDEPENDENT_AMBULATORY_CARE_PROVIDER_SITE_OTHER): Payer: Self-pay | Admitting: *Deleted

## 2010-12-30 DIAGNOSIS — E039 Hypothyroidism, unspecified: Secondary | ICD-10-CM

## 2010-12-30 DIAGNOSIS — E785 Hyperlipidemia, unspecified: Secondary | ICD-10-CM

## 2010-12-30 LAB — BASIC METABOLIC PANEL
BUN: 16 mg/dL (ref 6–23)
Calcium: 9.4 mg/dL (ref 8.4–10.5)
Creatinine, Ser: 0.8 mg/dL (ref 0.4–1.2)

## 2010-12-30 LAB — TSH: TSH: 0.3 u[IU]/mL — ABNORMAL LOW (ref 0.35–5.50)

## 2011-01-29 ENCOUNTER — Encounter: Payer: Self-pay | Admitting: Family Medicine

## 2011-02-05 ENCOUNTER — Other Ambulatory Visit: Payer: Medicare Other | Admitting: *Deleted

## 2011-02-08 ENCOUNTER — Other Ambulatory Visit (INDEPENDENT_AMBULATORY_CARE_PROVIDER_SITE_OTHER): Payer: Medicare Other | Admitting: *Deleted

## 2011-02-08 DIAGNOSIS — E785 Hyperlipidemia, unspecified: Secondary | ICD-10-CM

## 2011-02-08 LAB — LIPID PANEL
Cholesterol: 213 mg/dL — ABNORMAL HIGH (ref 0–200)
Total CHOL/HDL Ratio: 2
Triglycerides: 43 mg/dL (ref 0.0–149.0)

## 2011-02-08 LAB — HEPATIC FUNCTION PANEL
ALT: 21 U/L (ref 0–35)
AST: 21 U/L (ref 0–37)
Albumin: 3.9 g/dL (ref 3.5–5.2)
Alkaline Phosphatase: 77 U/L (ref 39–117)
Total Bilirubin: 0.5 mg/dL (ref 0.3–1.2)

## 2011-02-08 LAB — LDL CHOLESTEROL, DIRECT: Direct LDL: 102 mg/dL

## 2011-03-05 NOTE — Op Note (Signed)
Nancy Cuevas, Nancy Cuevas                 ACCOUNT NO.:  [id number]   MEDICAL RECORD NO.:  [id number]          PATIENT TYPE:  OIB   LOCATION:  5506                         FACILITY:  [facility]   PHYSICIAN:  Burnard Bunting, M.D.    DATE OF BIRTH:  January 04, 1946   DATE OF PROCEDURE:  03/25/2005  DATE OF DISCHARGE:  03/26/2005                                 OPERATIVE REPORT   PREOPERATIVE DIAGNOSIS:  Left wrist fracture.   POSTOPERATIVE DIAGNOSIS:  Left wrist fracture.   PROCEDURE:  Left wrist open reduction internal fixation.   SURGEON:  Burnard Bunting, M.D.   ASSISTANT:  Vear Clock, M.D.   ESTIMATED BLOOD LOSS:  20 mL.   DRAINS:  TLSO x1.   TOURNIQUET TIME:  Approximately 1 hour at 250 mmHg.   PROCEDURE IN DETAIL:  The patient brought to the operating room where  general endotracheal anesthesia was induced.  Preoperative IV antibiotics  administered.  The left wrist was prepped and draped with Betadine solution  and draped in a sterile manner.  An I-Band was used to cover the operative  field.  The arm was elevated and exsanguinated with an Esmarch wrap,  tourniquet was inflated.  A Henry approach to the wrist was made over the  FCR tendon.  The tendon sheath was split, FCR was retracted radially.  The  posterior aspect of the tendon sheath was split in the radial artery and FCR  tendon was retracted radially.  The pronator quadratus was detached from the  radial border of the distal radius, fracture was identified and reduced.  A  volar plate was then applied with reduction maneuver maintained.  Fluoroscopy in the AP & lateral planes were used to confirm reduction and  screw length.  Anatomic respiration of volar height tilt and inclination was  obtained.  The wrist was taken through a range of motion and full range of  motion, full coordination and supination.  At this time, the tourniquet was  released, bleeding points counter-controlled using bipolar electrocautery.  Skin was closed  over a TLSO drain using interrupted, inverted 3-0 Vicryl and  simple 3-0 nylon sutures.  The patient was placed into a bulky splint,  tolerating the procedure well without immediate complications.       GSD/MEDQ  D:  04/25/2005  T:  04/25/2005  Job:  [id number]

## 2011-03-05 NOTE — Assessment & Plan Note (Signed)
[facility]                            CARDIOLOGY OFFICE NOTE   Nancy Cuevas, Nancy Cuevas                        MRN:          [id number]  DATE:09/29/2006                            DOB:          [birth year]    CHIEF COMPLAINT:  My cholesterol is high, my mother has had a stroke  and I am worried.   I was asked by Dr. Zelphia Cairo to evaluate Nancy Cuevas, a  delightful 65 year old, younger appearing than stated age, married white  female with hyperlipidemia.   Her last lipid profile showed a total cholesterol of 248, triglycerides  of 66, HDL 95, VLDL 13, LDL of 140.  Her total cholesterol and HDL ratio  I calculated at 2.6.   She has no other conventional risk factors other than age.  Her mother  did have a stroke but she was in her early 1s.  She was also diabetic.   She is totally asymptomatic, has no symptoms of ischemic heart disease.   PAST MEDICAL HISTORY:  She has no known dye allergy. She is intolerant  of SULFA.   CURRENT MEDICATIONS:  1. Levothyroxine replacement 50 mcg daily.  2. Multivitamin daily.  3. Aspirin 81 mg daily.  4. Vitamin C 500 mg daily.  5. Betacarotene 15 mg daily.  6. Flax seed oil 1000 mg daily.  7. Calcium and vitamin D daily.   She does not smoke.  She drinks an occasional glass of wine, drinks very  little caffeinated beverages.  She exercises on a regular basis, 3x a  week at [employer].   She has had a hysterectomy in 1986.   FAMILY HISTORY:  Is negative for premature coronary disease or stroke.   SOCIAL HISTORY:  She is a retired Programmer, systems, Runner, broadcasting/film/video, principal for 30  years.  She is married and has 2 children.   REVIEW OF SYSTEMS:  Other than constipation and a history of thyroid  disease, on replacement is negative.  All systems were reviewed.   EXAM:  She is in no acute distress.  SKIN:  Is warm and dry.  Skin color is normal. Skin is intact.  Blood pressure is 135/72.  Her pulse 72 and regular.  Her  weight is 132.  She is 5 foot 4.  HEENT:  Normocephalic, atraumatic.  PERRLA, extraocular movements  intact.  Sclerae clear.  She wears glasses.  Facial asymmetry is normal.  Dentition is satisfactory.  NECK:  Is supple.  There is no thyromegaly.  Carotid upstrokes are equal  bilaterally without bruits.  No JVD.  Thyroid is not enlarged.  Trachea  is midline.  LUNGS:  Are clear.  HEART:  Reveals a nondisplaced PMI.  She has normal S1, S2.  S2 splits.  ABDOMEN:  Exam is soft with normal bowel sounds.  There is no midline  bruit.  There is no hepatomegaly.  EXTREMITIES:  With no cyanosis, clubbing or edema. Pulses are intact.  NEURO:  Exam is intact.  MUSCULOSKELETAL:  Intact.   Electrocardiogram is completely normal.   ASSESSMENT/PLAN:  I have spent a good 30 minutes  talking to Nancy Cuevas  about cardiovascular risk factors as well as specifically her lipid  profile.  I find her to be extremely health conscious and her risk is  extremely low for an acute coronary event or stroke at this point in  time.  She needs to avoid developing diabetes like her mother and with  her current lifestyle and with her weight, I suspect she will not.   I do not think she needs treatment for hyperlipidemia.  She has a very  low cholesterol and HDL ratio and at this time no other risk factors.   I will see her back on an as needed basis.     Thomas C. Daleen Squibb, MD, [facility]  Electronically Signed    TCW/MedQ  DD: 09/29/2006  DT: 09/30/2006  Job #: [id number]   cc:   Zelphia Cairo, MD

## 2011-03-21 ENCOUNTER — Other Ambulatory Visit: Payer: Self-pay | Admitting: Family Medicine

## 2011-04-04 ENCOUNTER — Other Ambulatory Visit: Payer: Self-pay | Admitting: Cardiology

## 2011-04-19 ENCOUNTER — Other Ambulatory Visit: Payer: Self-pay | Admitting: Obstetrics and Gynecology

## 2011-04-19 DIAGNOSIS — Z1231 Encounter for screening mammogram for malignant neoplasm of breast: Secondary | ICD-10-CM

## 2011-04-27 ENCOUNTER — Other Ambulatory Visit: Payer: Self-pay | Admitting: Family Medicine

## 2011-05-06 ENCOUNTER — Other Ambulatory Visit: Payer: Medicare Other

## 2011-05-07 ENCOUNTER — Other Ambulatory Visit: Payer: Self-pay | Admitting: Family Medicine

## 2011-05-10 ENCOUNTER — Other Ambulatory Visit (INDEPENDENT_AMBULATORY_CARE_PROVIDER_SITE_OTHER): Payer: Medicare Other

## 2011-05-10 DIAGNOSIS — E039 Hypothyroidism, unspecified: Secondary | ICD-10-CM

## 2011-05-10 DIAGNOSIS — Z1231 Encounter for screening mammogram for malignant neoplasm of breast: Secondary | ICD-10-CM

## 2011-05-10 NOTE — Progress Notes (Signed)
Labs only

## 2011-05-19 ENCOUNTER — Other Ambulatory Visit: Payer: Self-pay | Admitting: Family Medicine

## 2011-05-19 ENCOUNTER — Ambulatory Visit
Admission: RE | Admit: 2011-05-19 | Discharge: 2011-05-19 | Disposition: A | Discharge status: Home / Self Care | Payer: Medicare Other | Source: Ambulatory Visit | Attending: Obstetrics and Gynecology | Admitting: Obstetrics and Gynecology

## 2011-05-19 DIAGNOSIS — Z1231 Encounter for screening mammogram for malignant neoplasm of breast: Secondary | ICD-10-CM

## 2011-05-24 ENCOUNTER — Other Ambulatory Visit: Payer: Self-pay | Admitting: Family Medicine

## 2011-07-19 HISTORY — PX: OTHER SURGICAL HISTORY: SHX169

## 2011-07-20 ENCOUNTER — Ambulatory Visit (HOSPITAL_BASED_OUTPATIENT_CLINIC_OR_DEPARTMENT_OTHER)
Admission: RE | Admit: 2011-07-20 | Discharge: 2011-07-20 | Disposition: A | Discharge status: Home / Self Care | Payer: Medicare Other | Source: Ambulatory Visit | Attending: Orthopedic Surgery | Admitting: Orthopedic Surgery

## 2011-07-20 DIAGNOSIS — Z472 Encounter for removal of internal fixation device: Secondary | ICD-10-CM | POA: Insufficient documentation

## 2011-07-20 DIAGNOSIS — M249 Joint derangement, unspecified: Secondary | ICD-10-CM | POA: Insufficient documentation

## 2011-07-28 NOTE — Op Note (Signed)
NAMEABREANNA, DRAWDY                 ACCOUNT NO.:  [id number]  MEDICAL RECORD NO.:  [id number]  LOCATION:                                 FACILITY:  PHYSICIAN:  Betha Loa, MD             DATE OF BIRTH:  DATE OF PROCEDURE:  07/20/2011 DATE OF DISCHARGE:                              OPERATIVE REPORT   PREOPERATIVE DIAGNOSIS:  Left flexor pollicis longus rupture.  POSTOPERATIVE DIAGNOSIS:  Left flexor pollicis longus rupture.  PROCEDURE:  Left wrist removal of hardware and FPL repair with palmaris longus graft.  SURGEON:  Betha Loa, MD.  ASSISTANT:  Cindee Salt, MD  ANESTHESIA:  General with regional IV fluids per Anesthesia flow sheet.  ESTIMATED BLOOD LOSS:  Minimal.  COMPLICATIONS:  None.  SPECIMENS:  None.  TOURNIQUET TIME:  88 minutes.  DISPOSITION:  Stable to PACU.  INDICATIONS:  Ms. Nancy Cuevas is a 65 year old female, who underwent open reduction and internal fixation of the left distal radius fracture in 2006.  She did well following this.  She had no problems.  In July 2012, she was unable to flex the IP joint of her thumb.  She was referred to me in the office for evaluation.  She had an inability to flex at the IP joint of the thumb.  Her fracture had fully healed.  It was felt that this was due to a rupture of the FPL.  I discussed with Ms. Moskowitz the nature of the condition.  We discussed reconstruction with removal of hardware and palmaris longus tendon grafting.  Risks, benefits, and alternatives of surgery were discussed including the risk of blood loss; infection; damage to nerves, vessels, tendons, ligaments, bone; failure of surgery; need for additional surgery; complications with wound healing; continued pain; weakness; stiffness; and continued inability to flex the thumb.  She voiced understanding of these risks and elected to proceed.  OPERATIVE COURSE:  After being identified preoperatively by myself, the patient and I agreed upon procedure and  site of the procedure.  Surgical site was marked.  Risks, benefits, alternatives of the surgery were reviewed and she wished to proceed.  Surgical consent had been signed. She was given 1 g of IV Ancef as preoperative antibiotic prophylaxis. She was transported to the operating room and placed in the operating table in supine position with left upper extremity on arm board.  A regional block had been performed by Anesthesia in preoperative holding. General anesthesia was induced by Anesthesia in the operating room. Left upper extremity was prepped and draped in normal sterile orthopedic fashion.  A surgical pause was performed between surgeons, Anesthesia, and operating staff and all were in agreement as to the patient, procedure, and site of procedure.  Tourniquet at proximal aspect of the extremity was inflated to 250 mmHg after exsanguination of the limb with an Esmarch bandage.  The previous incision line was followed.  This was carried into the subcutaneous tissues.  Spreading technique was used. FCR was retracted ulnarly to protect the palmar cutaneous branch of the median nerve.  The FPL was identified.  It was ruptured.  There were a couple of strands  of tendon remaining intact.  There was scar around this.  There was a scar tissue over top of the plate.  This was released.  The screws were removed from the plate successfully.  The bone that had grown around the plate was chipped off.  The plate was removed in its entirety.  The bone edges that had grown up onto the plate were taken down using the rongeur.  Attention was turned to the FPL.  The incision had to be extended distally to find the distal stump of the FPL.  Care was taken to protect the palmar cutaneous branch of the median nerve and the superficial branch of the radial artery.  The FPL tendon stump was identified and was able to be pulled into the wound.  The palmaris longus was identified distally and a  small transverse incision made over its proximal aspect of the musculature. The palmaris longus was harvested.  This was able to be doubled over. The muscle was removed from the tendon.  The tendon passer was used. The palmaris longus graft was placed through the distal stump of the FPL.  It was weaved back and fourth in a Bunnell-type fashion.  It was secured using 3-0 Ethibond suture in a horizontal mattress fashion. Attention was then set and the palmaris longus graft tendon ends were then weaved through the proximal stump of the FPL.  This held the thumb in palmar abduction with the IP joint flexed approximately 30-40 degrees.  It was felt tension was appropriate.  The thumb was able to be fully extended.  The wound was then copiously irrigated with sterile saline.  Soft tissue was repaired over top of the bone with a single 4-0 Vicryl suture.  A single inverted interrupted 4-0 Vicryl suture was placed in the subcutaneous tissues.  The incisions were closed with 4-0 nylon in a horizontal mattress fashion.  The wounds were dressed with sterile Xeroform and 4 x 4's, and wrapped with Kerlix.  A dorsal blocking splint of the thumb in a volar wrist splint were placed keeping the wrist flexed 5-10 degrees, the thumb in palmar abduction with the MP and IP joint slightly flexed.  This was wrapped with Kerlix and Ace bandage.  Tourniquet was deflated at 88 minutes.  The fingertips were pink with brisk capillary refill after deflation of the tourniquet.  The operative drapes were broken down, and the patient was awoken from anesthesia safely.  She was transferred back to stretcher and taken to PACU in stable condition.  We will see her in 1 week for postoperative followup.  She was given Percocet 5/325 one to two p.o. q.6 h p.r.n. pain, dispensed #40.     Betha Loa, MD     KK/MEDQ  D:  07/20/2011  T:  07/20/2011  Job:  [id number]  Electronically Signed by Betha Loa  on 07/28/2011  01:57:46 PM

## 2011-09-17 ENCOUNTER — Encounter: Payer: Self-pay | Admitting: Family

## 2011-09-17 ENCOUNTER — Ambulatory Visit (INDEPENDENT_AMBULATORY_CARE_PROVIDER_SITE_OTHER): Payer: Medicare Other | Admitting: Family

## 2011-09-17 VITALS — BP 110/80 | HR 72 | Temp 97.8°F | Resp 16 | Wt 137.0 lb

## 2011-09-17 DIAGNOSIS — Z23 Encounter for immunization: Secondary | ICD-10-CM

## 2011-09-17 DIAGNOSIS — N390 Urinary tract infection, site not specified: Secondary | ICD-10-CM | POA: Insufficient documentation

## 2011-09-17 LAB — POCT URINALYSIS DIPSTICK
Glucose, UA: NEGATIVE
Nitrite, UA: NEGATIVE
Urobilinogen, UA: 0.2
pH, UA: 6

## 2011-09-17 MED ORDER — CIPROFLOXACIN HCL 500 MG PO TABS: 500.0000 mg | ORAL_TABLET | Freq: Two times a day (BID) | ORAL | Status: AC

## 2011-09-17 NOTE — Patient Instructions (Signed)

## 2011-09-17 NOTE — Progress Notes (Signed)
  Subjective:    Patient ID: Nancy Cuevas, female    DOB: 1946-05-22, 65 y.o.   MRN: [id number]  HPI  Nancy Cuevas is a 65 yr old female who presents today with 10 day history of urinary pressure and foul odor.  She denies associated low back pain, hematuria or low back pain. She has not taken anything over the counter.  Denies fever.      Review of Systems See HPI   History   Social History  . Marital Status: Married    Spouse Name: N/A    Number of Children: N/A  . Years of Education: N/A   Occupational History  . Not on file.   Social History Main Topics  . Smoking status: Never Smoker   . Smokeless tobacco: Never Used  . Alcohol Use: Not on file  . Drug Use: Not on file  . Sexually Active: Not on file   Other Topics Concern  . Not on file   Social History Narrative  . No narrative on file    No past surgical history on file.  No family history on file.  Allergies  Allergen Reactions  . Sulfonamide Derivatives     REACTION: RASH    Current Outpatient Prescriptions on File Prior to Visit  Medication Sig Dispense Refill  . levothyroxine (SYNTHROID, LEVOTHROID) 50 MCG tablet take 1 tablet by mouth once daily  30 tablet  11  . pravastatin (PRAVACHOL) 20 MG tablet take 1 tablet by mouth every evening  30 tablet  9    BP 110/80  Pulse 72  Temp(Src) 97.8 F (36.6 C) (Oral)  Resp 16  Wt 137 lb 0.6 oz (62.161 kg)       Objective:   Physical Exam  Constitutional: She appears well-developed and well-nourished. No distress.  HENT:  Head: Normocephalic and atraumatic.  Mouth/Throat: No oropharyngeal exudate.  Cardiovascular: Normal rate and regular rhythm.   No murmur heard. Pulmonary/Chest: Effort normal and breath sounds normal. No respiratory distress. She has no wheezes. She has no rales. She exhibits no tenderness.  Abdominal: Soft. Normal appearance. She exhibits no distension. There is no tenderness.  Genitourinary:       Neg CVAT bilaterally.           Assessment & Plan:

## 2011-09-17 NOTE — Assessment & Plan Note (Signed)
UA and symptoms consistent with UTI. Will treat with cipro- urine to be sent for culture.

## 2011-09-20 LAB — URINE CULTURE: Colony Count: >=100000

## 2011-11-15 ENCOUNTER — Encounter (HOSPITAL_BASED_OUTPATIENT_CLINIC_OR_DEPARTMENT_OTHER)
Admission: RE | Disposition: A | Discharge status: Home / Self Care | Payer: Self-pay | Source: Ambulatory Visit | Attending: Orthopedic Surgery

## 2011-11-15 ENCOUNTER — Ambulatory Visit (HOSPITAL_BASED_OUTPATIENT_CLINIC_OR_DEPARTMENT_OTHER)
Admission: RE | Admit: 2011-11-15 | Discharge: 2011-11-15 | Disposition: A | Discharge status: Home / Self Care | Payer: Medicare Other | Source: Ambulatory Visit | Attending: Orthopedic Surgery | Admitting: Orthopedic Surgery

## 2011-11-15 ENCOUNTER — Encounter (HOSPITAL_BASED_OUTPATIENT_CLINIC_OR_DEPARTMENT_OTHER): Payer: Self-pay | Admitting: *Deleted

## 2011-11-15 ENCOUNTER — Other Ambulatory Visit: Payer: Self-pay | Admitting: Orthopedic Surgery

## 2011-11-15 ENCOUNTER — Encounter (HOSPITAL_BASED_OUTPATIENT_CLINIC_OR_DEPARTMENT_OTHER): Payer: Self-pay | Admitting: Anesthesiology

## 2011-11-15 ENCOUNTER — Ambulatory Visit (HOSPITAL_BASED_OUTPATIENT_CLINIC_OR_DEPARTMENT_OTHER): Payer: Medicare Other | Admitting: Anesthesiology

## 2011-11-15 ENCOUNTER — Encounter (HOSPITAL_BASED_OUTPATIENT_CLINIC_OR_DEPARTMENT_OTHER): Payer: Self-pay | Admitting: Orthopedic Surgery

## 2011-11-15 DIAGNOSIS — G5600 Carpal tunnel syndrome, unspecified upper limb: Secondary | ICD-10-CM | POA: Insufficient documentation

## 2011-11-15 DIAGNOSIS — E039 Hypothyroidism, unspecified: Secondary | ICD-10-CM | POA: Insufficient documentation

## 2011-11-15 DIAGNOSIS — E785 Hyperlipidemia, unspecified: Secondary | ICD-10-CM | POA: Insufficient documentation

## 2011-11-15 HISTORY — PX: CARPAL TUNNEL RELEASE: SHX101

## 2011-11-15 LAB — POCT HEMOGLOBIN-HEMACUE: Hemoglobin: 13 g/dL (ref 12.0–15.0)

## 2011-11-15 SURGERY — CARPAL TUNNEL RELEASE
Anesthesia: General | Site: Wrist | Laterality: Left | Wound class: Clean

## 2011-11-15 MED ORDER — LIDOCAINE HCL (CARDIAC) 20 MG/ML IV SOLN
INTRAVENOUS | Status: DC | PRN
  Administered 2011-11-15: 100 mg via INTRAVENOUS

## 2011-11-15 MED ORDER — HYDROMORPHONE HCL PF 1 MG/ML IJ SOLN: 0.2500 mg | INTRAMUSCULAR | Status: DC | PRN

## 2011-11-15 MED ORDER — DROPERIDOL 2.5 MG/ML IJ SOLN: 0.6250 mg | INTRAMUSCULAR | Status: DC | PRN

## 2011-11-15 MED ORDER — FENTANYL CITRATE 0.05 MG/ML IJ SOLN
INTRAMUSCULAR | Status: DC | PRN
  Administered 2011-11-15: 50 ug via INTRAVENOUS

## 2011-11-15 MED ORDER — DEXAMETHASONE SODIUM PHOSPHATE 4 MG/ML IJ SOLN
INTRAMUSCULAR | Status: DC | PRN
  Administered 2011-11-15: 4 mg via INTRAVENOUS

## 2011-11-15 MED ORDER — OXYCODONE-ACETAMINOPHEN 5-325 MG PO TABS: ORAL_TABLET | ORAL | Status: AC

## 2011-11-15 MED ORDER — BUPIVACAINE HCL (PF) 0.25 % IJ SOLN
INTRAMUSCULAR | Status: DC | PRN
  Administered 2011-11-15: 10 mL

## 2011-11-15 MED ORDER — LACTATED RINGERS IV SOLN
INTRAVENOUS | Status: DC
  Administered 2011-11-15: 08:00:00 via INTRAVENOUS

## 2011-11-15 MED ORDER — EPHEDRINE SULFATE 50 MG/ML IJ SOLN
INTRAMUSCULAR | Status: DC | PRN
  Administered 2011-11-15: 5 mg via INTRAVENOUS

## 2011-11-15 MED ORDER — PROPOFOL 10 MG/ML IV EMUL
INTRAVENOUS | Status: DC | PRN
  Administered 2011-11-15: 160 mg via INTRAVENOUS

## 2011-11-15 MED ORDER — MIDAZOLAM HCL 5 MG/5ML IJ SOLN
INTRAMUSCULAR | Status: DC | PRN
  Administered 2011-11-15: 1 mg via INTRAVENOUS

## 2011-11-15 MED ORDER — CEFAZOLIN SODIUM 1-5 GM-% IV SOLN
INTRAVENOUS | Status: DC | PRN
  Administered 2011-11-15: 1 g via INTRAVENOUS

## 2011-11-15 MED ORDER — ONDANSETRON HCL 4 MG/2ML IJ SOLN
INTRAMUSCULAR | Status: DC | PRN
  Administered 2011-11-15: 4 mg via INTRAVENOUS

## 2011-11-15 SURGICAL SUPPLY — 33 items
BANDAGE ELASTIC 3 VELCRO ST LF (GAUZE/BANDAGES/DRESSINGS) ×2 IMPLANT
BANDAGE GAUZE ELAST BULKY 4 IN (GAUZE/BANDAGES/DRESSINGS) ×2 IMPLANT
BLADE MINI RND TIP GREEN BEAV (BLADE) IMPLANT
BLADE SURG 15 STRL LF DISP TIS (BLADE) ×2 IMPLANT
BLADE SURG 15 STRL SS (BLADE) ×2
BNDG ESMARK 4X9 LF (GAUZE/BANDAGES/DRESSINGS) ×2 IMPLANT
CHLORAPREP W/TINT 26ML (MISCELLANEOUS) ×2 IMPLANT
CLOTH BEACON ORANGE TIMEOUT ST (SAFETY) ×2 IMPLANT
CORDS BIPOLAR (ELECTRODE) ×2 IMPLANT
COVER MAYO STAND STRL (DRAPES) ×2 IMPLANT
COVER TABLE BACK 60X90 (DRAPES) ×2 IMPLANT
CUFF TOURNIQUET SINGLE 18IN (TOURNIQUET CUFF) ×2 IMPLANT
DRAPE EXTREMITY T 121X128X90 (DRAPE) ×2 IMPLANT
DRAPE SURG 17X23 STRL (DRAPES) ×2 IMPLANT
DRSG PAD ABDOMINAL 8X10 ST (GAUZE/BANDAGES/DRESSINGS) ×2 IMPLANT
GAUZE XEROFORM 1X8 LF (GAUZE/BANDAGES/DRESSINGS) ×2 IMPLANT
GLOVE BIO SURGEON STRL SZ 6.5 (GLOVE) ×2 IMPLANT
GLOVE BIO SURGEON STRL SZ7.5 (GLOVE) ×2 IMPLANT
GOWN PREVENTION PLUS XLARGE (GOWN DISPOSABLE) ×2 IMPLANT
GOWN STRL REIN XL XLG (GOWN DISPOSABLE) ×2 IMPLANT
NEEDLE HYPO 25X1 1.5 SAFETY (NEEDLE) ×2 IMPLANT
NS IRRIG 1000ML POUR BTL (IV SOLUTION) ×2 IMPLANT
PACK BASIN DAY SURGERY FS (CUSTOM PROCEDURE TRAY) ×2 IMPLANT
PADDING CAST ABS 4INX4YD NS (CAST SUPPLIES) ×1
PADDING CAST ABS COTTON 4X4 ST (CAST SUPPLIES) ×1 IMPLANT
SPONGE GAUZE 4X4 12PLY (GAUZE/BANDAGES/DRESSINGS) ×2 IMPLANT
STOCKINETTE 4X48 STRL (DRAPES) ×2 IMPLANT
SUT ETHILON 4 0 PS 2 18 (SUTURE) ×2 IMPLANT
SYR BULB 3OZ (MISCELLANEOUS) ×2 IMPLANT
SYR CONTROL 10ML LL (SYRINGE) ×2 IMPLANT
TOWEL OR 17X24 6PK STRL BLUE (TOWEL DISPOSABLE) ×2 IMPLANT
UNDERPAD 30X30 INCONTINENT (UNDERPADS AND DIAPERS) ×2 IMPLANT
WATER STERILE IRR 1000ML POUR (IV SOLUTION) ×2 IMPLANT

## 2011-11-15 NOTE — Anesthesia Postprocedure Evaluation (Signed)
Anesthesia Post Note  Patient: Nancy Cuevas  Procedure(s) Performed:  CARPAL TUNNEL RELEASE  Anesthesia type: MAC  Patient location: PACU  Post pain: Pain level controlled  Post assessment: Patient's Cardiovascular Status Stable  Last Vitals:  Filed Vitals:   11/15/11 1015  BP: 137/61  Pulse: 58  Temp:   Resp: 14    Post vital signs: Reviewed and stable  Level of consciousness: sedated  Complications: No apparent anesthesia complications

## 2011-11-15 NOTE — Op Note (Signed)
Dictation [id number]

## 2011-11-15 NOTE — H&P (Signed)
  Nancy Cuevas is an 66 y.o. female.   Chief Complaint: carpal tunnel syndrome HPI: 66 yo female with pins and needles sensation in thumb, index, long fingers.  Nocturnal symptoms.  Positive nerve conduction test for bilateral carpal tunnel syndrome.  Past Medical History  Diagnosis Date  . POSTMENOPAUSAL STATUS 02/07/2008  . OSTEOPENIA 03/24/2008  . HYPOTHYROIDISM 07/18/2007  . HYPERLIPIDEMIA 10/27/2010    Past Surgical History  Procedure Date  . Left wrist hardware removal 10/12 10/12  . Retinal detachment surgery 2009  . Fracture surgery june 2006  . Cataract extraction 2011  . Abdominal hysterectomy 1988    History reviewed. No pertinent family history. Social History:  reports that she has never smoked. She has never used smokeless tobacco. She reports that she drinks about 1.8 ounces of alcohol per week. She reports that she does not use illicit drugs.  Allergies:  Allergies  Allergen Reactions  . Sulfonamide Derivatives     REACTION: RASH    Medications Prior to Admission  Medication Dose Route Frequency Provider Last Rate Last Dose  . lactated ringers infusion   Intravenous Continuous Remonia Richter, MD 20 mL/hr at 11/15/11 [phone]     Medications Prior to Admission  Medication Sig Dispense Refill  . aspirin 81 MG tablet Take 81 mg by mouth every evening.        . Calcium Carbonate-Vitamin D (CALCIUM 600 + D PO) Take 1 tablet by mouth daily.        . Flaxseed, Linseed, (FLAX SEED OIL) 1000 MG CAPS Take 1 capsule by mouth daily.        Marland Kitchen levothyroxine (SYNTHROID, LEVOTHROID) 50 MCG tablet take 1 tablet by mouth once daily  30 tablet  11  . Multiple Vitamin (MULTIVITAMIN) tablet Take 1 tablet by mouth daily.        . Omega-3 Fatty Acids (FISH OIL) 1000 MG CAPS Take 1 capsule by mouth daily.        . pravastatin (PRAVACHOL) 20 MG tablet take 1 tablet by mouth every evening  30 tablet  9    Results for orders placed during the hospital encounter of 11/15/11 (from the  past 48 hour(s))  POCT HEMOGLOBIN-HEMACUE     Status: Normal   Collection Time   11/15/11  7:43 AM      Component Value Range Comment   Hemoglobin 13.0  12.0 - 15.0 (g/dL)     No results found.   A comprehensive review of systems was negative except for: Eyes: positive for contacts/glasses  Blood pressure 127/66, pulse 60, temperature 97.7 F (36.5 C), temperature source Oral, resp. rate 16, height 5\' 4"  (1.626 m), weight 60.328 kg (133 lb), SpO2 98.00%.  General appearance: alert, cooperative and appears stated age Head: Normocephalic, without obvious abnormality, atraumatic Neck: supple, symmetrical, trachea midline Resp: clear to auscultation bilaterally Cardio: regular rate and rhythm GI: soft, non-tender; bowel sounds normal; no masses,  no organomegaly Extremities: intact sensation and capillary refill in all digits.  +epl/fpl/io.  Durkins test positive at carpal tunnel. Pulses: 2+ and symmetric Skin: Skin color, texture, turgor normal. No rashes or lesions Neurologic: Grossly normal Incision/Wound: na  Assessment/Plan Bilateral carpal tunnel syndrome.  Patient wishes to have carpal tunnel release.  Discussed risks, benefits, alternatives and patient agrees with plan of care.  Freedom Lopezperez R 11/15/2011, 8:41 AM

## 2011-11-15 NOTE — Brief Op Note (Signed)
11/15/2011  9:32 AM  PATIENT:  Nancy Cuevas  66 y.o. female  PRE-OPERATIVE DIAGNOSIS:  left carpal tunnel syndrome  POST-OPERATIVE DIAGNOSIS:  left carpal tunnel syndrome  PROCEDURE:  Procedure(s): CARPAL TUNNEL RELEASE  SURGEON:  Surgeon(s): Tami Ribas, MD  PHYSICIAN ASSISTANT:   ASSISTANTS: none   ANESTHESIA:   general  EBL:  Total I/O In: 700 [I.V.:700] Out: -   BLOOD ADMINISTERED:none  DRAINS: none   LOCAL MEDICATIONS USED:  MARCAINE 10 CC  SPECIMEN:  No Specimen  DISPOSITION OF SPECIMEN:  N/A  COUNTS:  YES  TOURNIQUET:   Total Tourniquet Time Documented: Upper Arm (Left) - 22 minutes  DICTATION: .Other Dictation: Dictation Number [id number]  PLAN OF CARE: Discharge to home after PACU  PATIENT DISPOSITION:  PACU - hemodynamically stable.

## 2011-11-15 NOTE — Anesthesia Procedure Notes (Signed)
Procedure Name: LMA Insertion Date/Time: 11/15/2011 8:58 AM Performed by: Radford Pax Pre-anesthesia Checklist: Patient identified, Emergency Drugs available, Suction available, Patient being monitored and Timeout performed Patient Re-evaluated:Patient Re-evaluated prior to inductionOxygen Delivery Method: Circle System Utilized Preoxygenation: Pre-oxygenation with 100% oxygen Intubation Type: IV induction Ventilation: Mask ventilation without difficulty LMA: LMA inserted LMA Size: 4.0 Number of attempts: 1 (atraumatic) Airway Equipment and Method: bite block (bite gard left posterior) Placement Confirmation: positive ETCO2 Tube secured with: Tape Dental Injury: Teeth and Oropharynx as per pre-operative assessment

## 2011-11-15 NOTE — Transfer of Care (Signed)
Immediate Anesthesia Transfer of Care Note  Patient: Nancy Cuevas  Procedure(s) Performed:  CARPAL TUNNEL RELEASE  Patient Location: PACU  Anesthesia Type: General  Level of Consciousness: awake, alert , oriented and patient cooperative  Airway & Oxygen Therapy: Patient Spontanous Breathing and Patient connected to face mask oxygen  Post-op Assessment: Report given to PACU RN and Post -op Vital signs reviewed and stable  Post vital signs: Reviewed and stable  Complications: No apparent anesthesia complications

## 2011-11-15 NOTE — Anesthesia Postprocedure Evaluation (Deleted)
Anesthesia Post Note  Patient: Nancy Cuevas  Procedure(s) Performed:  CARPAL TUNNEL RELEASE  Anesthesia type: general  Patient location: PACU  Post pain: Pain level controlled  Post assessment: Patient's Cardiovascular Status Stable  Last Vitals:  Filed Vitals:   11/15/11 1052  BP: 154/69  Pulse: 62  Temp: 36.3 C  Resp: 20    Post vital signs: Reviewed and stable  Level of consciousness: sedated  Complications: No apparent anesthesia complications

## 2011-11-15 NOTE — Op Note (Signed)
Nancy Cuevas, Nancy Cuevas                 ACCOUNT NO.:  [id number]  MEDICAL RECORD NO.:  [id number]  LOCATION:                                 FACILITY:  PHYSICIAN:  Betha Loa, MD             DATE OF BIRTH:  DATE OF PROCEDURE:  11/15/2011 DATE OF DISCHARGE:                              OPERATIVE REPORT   PREOPERATIVE DIAGNOSIS:  Left carpal tunnel syndrome.  POSTOPERATIVE DIAGNOSIS:  Left carpal tunnel syndrome.  PROCEDURE:  Left carpal tunnel release.  SURGEON:  Betha Loa, MD  ASSISTANT:  None.  ANESTHESIA:  General.  INTRAVENOUS FLUIDS:  Per anesthesia flow sheet.  ESTIMATED BLOOD LOSS:  Minimal.  COMPLICATIONS:  None.  SPECIMENS:  None.  TOURNIQUET TIME:  22 minutes.  DISPOSITION:  Stable to PACU.  INDICATIONS:  Ms. Nancy Cuevas is a 66 year old female who has had complaints of pins and needle sensation of the thumb, index, and long fingers.  It caused her nocturnal symptoms.  On evaluation in the office, she had a positive Durkan's at the median nerve at the carpal tunnel on the left side.  Nerve conduction studies were positive for carpal tunnel syndrome.  I discussed with Ms. Nancy Cuevas the nature of the condition.  We discussed nonoperative and operative measures.  She wished to have operative release of the transverse carpal ligament.  Risks, benefits, and alternatives of surgery were discussed including the risks of blood loss, infection, damage to nerves, vessels, tendons, ligaments, and bone, failure of surgery, need for additional surgery, complications with wound healing, continued pain, continued carpal tunnel syndrome. She voiced understanding these risks and elected to proceed.  OPERATIVE COURSE:  After being identified preoperatively by myself, the patient and I agreed upon the procedure and site of procedure.  The surgical site was marked.  The risks, benefits, and alternatives of surgery were reviewed and she wished to proceed.  Surgical consent had been  signed.  She was given 1 g of IV Ancef as preoperative antibiotic prophylaxis.  She was transferred to the operating room and placed on the operating table in supine position with left upper extremity on an arm board.  General anesthesia was induced by the anesthesiologist.  The left upper extremity was prepped and draped in a normal sterile orthopedic fashion.  A surgical pause was performed between surgeons, anesthesia, and operating staff and all were in agreement as to the patient, procedure, and site of procedure.  Tourniquet on the proximal aspect of the extremity was inflated to 250 mmHg after exsanguination of limb with an Esmarch bandage.  An incision was made over the transverse carpal ligament.  This was carried into subcutaneous tissues by spreading technique.  The ulnar neurovascular bundle was identified and protected throughout the case.  The transverse carpal ligament was incised sharply.  It was incised to its full distal extent.  Care was taken to ensure complete release of the ligament.  It was then incised sharply in the proximal direction.  The distal aspect of the volar antebrachial fascia was split as well.  Under direct visualization, the nerve was completely decompressed.  The nerve was slightly adherent to  the radial leaflet.  This was freed up.  There was some scarring in the area.  This was freed up as well.  The motor branch was identified and was intact.  The wound was copiously irrigated with sterile saline by bulb syringe.  The skin was closed with 4-0 nylon in a horizontal mattress fashion.  The wound was injected with 10 mL of 0.25% plain Marcaine.  It was dressed with sterile Xeroform, 4 x 4's, ABD and wrapped with Kerlix and Ace bandage.  Tourniquet was deflated at 22 minutes.  The fingertips were pink with brisk capillary refill after deflation of tourniquet.  Operative drapes were broken down and the patient was awoken from anesthesia safely.  She was  transferred back to stretcher and taken to PACU in stable condition.  I will see her back in the office in 1 week for postoperative followup.  I will give her Percocet 5/325 one to two p.o. q.6 hours p.r.n. pain, dispensed #30.     Betha Loa, MD     KK/MEDQ  D:  11/15/2011  T:  11/15/2011  Job:  [id number]

## 2011-11-15 NOTE — Anesthesia Preprocedure Evaluation (Addendum)
Anesthesia Evaluation  Patient identified by MRN, date of birth, ID band Patient awake    Reviewed: Allergy & Precautions, H&P , NPO status   History of Anesthesia Complications Negative for: history of anesthetic complications  Airway       Dental   Pulmonary neg pulmonary ROS,  clear to auscultation  Pulmonary exam normal       Cardiovascular neg cardio ROS Regular - Systolic murmurs    Neuro/Psych Negative Neurological ROS     GI/Hepatic negative GI ROS, Neg liver ROS,   Endo/Other  Hypothyroidism   Renal/GU negative Renal ROS     Musculoskeletal   Abdominal   Peds  Hematology   Anesthesia Other Findings   Reproductive/Obstetrics                           Anesthesia Physical Anesthesia Plan  ASA: II  Anesthesia Plan: General   Post-op Pain Management:    Induction: Intravenous  Airway Management Planned: LMA  Additional Equipment:   Intra-op Plan:   Post-operative Plan: Extubation in OR  Informed Consent:   Plan Discussed with: CRNA and Anesthesiologist  Anesthesia Plan Comments:         Anesthesia Quick Evaluation

## 2011-11-16 ENCOUNTER — Encounter (HOSPITAL_BASED_OUTPATIENT_CLINIC_OR_DEPARTMENT_OTHER): Payer: Self-pay | Admitting: Orthopedic Surgery

## 2012-02-07 ENCOUNTER — Other Ambulatory Visit: Payer: Self-pay | Admitting: Cardiology

## 2012-03-23 ENCOUNTER — Ambulatory Visit (INDEPENDENT_AMBULATORY_CARE_PROVIDER_SITE_OTHER): Payer: Medicare Other | Admitting: Family Medicine

## 2012-03-23 ENCOUNTER — Encounter: Payer: Self-pay | Admitting: Family Medicine

## 2012-03-23 VITALS — BP 112/74 | HR 64 | Temp 97.9°F | Ht 64.0 in | Wt 134.4 lb

## 2012-03-23 DIAGNOSIS — E039 Hypothyroidism, unspecified: Secondary | ICD-10-CM

## 2012-03-23 DIAGNOSIS — E785 Hyperlipidemia, unspecified: Secondary | ICD-10-CM

## 2012-03-23 LAB — BASIC METABOLIC PANEL
CO2: 26 mEq/L (ref 19–32)
Chloride: 108 mEq/L (ref 96–112)
Glucose, Bld: 80 mg/dL (ref 70–99)
Potassium: 4.7 mEq/L (ref 3.5–5.1)
Sodium: 142 mEq/L (ref 135–145)

## 2012-03-23 LAB — HEPATIC FUNCTION PANEL
ALT: 20 U/L (ref 0–35)
AST: 29 U/L (ref 0–37)
Albumin: 4.3 g/dL (ref 3.5–5.2)
Alkaline Phosphatase: 68 U/L (ref 39–117)
Bilirubin, Direct: 0.1 mg/dL (ref 0.0–0.3)
Total Protein: 7.4 g/dL (ref 6.0–8.3)

## 2012-03-23 NOTE — Progress Notes (Signed)
  Subjective:    Patient ID: Nancy Cuevas, female    DOB: 04-Sep-1946, 66 y.o.   MRN: [id number]  HPI Pt here f/u thyroid and cholesterol.  No problems.    Review of Systems as above   Objective:   Physical Exam  Constitutional: She is oriented to person, place, and time. She appears well-developed and well-nourished.  Cardiovascular: Normal rate, regular rhythm and normal heart sounds.   No murmur heard. Pulmonary/Chest: Effort normal and breath sounds normal. No respiratory distress. She has no wheezes.  Musculoskeletal: She exhibits no edema and no tenderness.  Neurological: She is alert and oriented to person, place, and time.  Psychiatric: She has a normal mood and affect. Her behavior is normal. Judgment and thought content normal.          Assessment & Plan:

## 2012-03-23 NOTE — Assessment & Plan Note (Signed)
Check labs con't meds 

## 2012-03-23 NOTE — Assessment & Plan Note (Signed)
Stable check labs  

## 2012-03-23 NOTE — Patient Instructions (Signed)

## 2012-03-24 LAB — TSH: TSH: 1.94 u[IU]/mL (ref 0.35–5.50)

## 2012-03-24 LAB — LDL CHOLESTEROL, DIRECT: Direct LDL: 112.9 mg/dL

## 2012-05-11 ENCOUNTER — Other Ambulatory Visit: Payer: Self-pay | Admitting: Cardiology

## 2012-05-25 ENCOUNTER — Other Ambulatory Visit: Payer: Self-pay | Admitting: Family Medicine

## 2012-07-24 ENCOUNTER — Telehealth: Payer: Self-pay | Admitting: Cardiology

## 2012-07-24 NOTE — Telephone Encounter (Signed)
New Problem:    Patient called in wanting to be seen and have some lab work drawn because she does not think that her cholesterol medication is doing what it is supposed to be doing.  Patient has had neck pain, rapid heart beat at times, unusually fatigued.  Please call back.

## 2012-07-24 NOTE — Telephone Encounter (Signed)
Spoke with pt. Pt states palpitations have been a little more frequent. She is requesting an appt with Dr Shirlee Latch to discuss palpitations and cholesterol med. Pt given appt 08/04/12 with Dr Shirlee Latch.

## 2012-08-03 ENCOUNTER — Encounter: Payer: Self-pay | Admitting: *Deleted

## 2012-08-04 ENCOUNTER — Ambulatory Visit (INDEPENDENT_AMBULATORY_CARE_PROVIDER_SITE_OTHER): Payer: Medicare Other | Admitting: Cardiology

## 2012-08-04 ENCOUNTER — Encounter: Payer: Self-pay | Admitting: Cardiology

## 2012-08-04 VITALS — BP 122/72 | Ht 64.0 in | Wt 136.0 lb

## 2012-08-04 DIAGNOSIS — R002 Palpitations: Secondary | ICD-10-CM

## 2012-08-04 DIAGNOSIS — R209 Unspecified disturbances of skin sensation: Secondary | ICD-10-CM

## 2012-08-04 DIAGNOSIS — E785 Hyperlipidemia, unspecified: Secondary | ICD-10-CM

## 2012-08-04 DIAGNOSIS — R200 Anesthesia of skin: Secondary | ICD-10-CM

## 2012-08-04 NOTE — Progress Notes (Signed)
Patient ID: Nancy Cuevas, female   DOB: [birth year], 66 y.o.   MRN: [id number] PCP: Dr. Laury Axon  66 yo presents for cardiology followup.  I saw her in the past for palpitations.  Holter monitor at the time showed some short runs of atrial tachycardia versus MAT.  She has noticed that her right hand will feel "numb" at night and is concerned that this could be a sign of cardiac disease.  Of note, she had surgery for carpal tunnel syndrome in her left wrist.  She has occasional mild palpitations but no long runs.  No exertional dyspnea.  She walks her dog and goes to [employer].  No chest pain.   ECG: NSR with PVC  Labs (12/11): LDL 177, HDL 100, Lp(a) < 15  Labs (1/12): K 5.2, creatinine 0.7  Labs (6/13): HDL 96, LDL 113, K 4.7, creatinine 0.6, TSH normal.   Allergies:  1) ! Sulfa   Past Medical History:  1. Hypothyroidism  2. Osteopenia  3. Palpitations: Echo (1/12) EF 55-60%, mild diastolic dysfunction, atrial septal aneurysm. Holter (1/12): short runs of atrial tachycardia versus MAT.  4. Hyperlipidemia   Family History:   Mother : deceased 62 yo  FATHER:DECEASED  1 BROTHER:LIVING  HEART: FATHER-OPEN-HEART, UNCLES (MOTHER'S SIDE) , MOTHER, MGM,MGF,PGF  Family History Breast cancer 1st degree relative <50: 2 AUNTS (MOTHER'S SISTER)  Family History Lung cancer: AUNT-MOTHER'S SIDE  Family History Diabetes 1st degree relative: MOTHER, 2 AUNTS (MOTHER'S SIDE)  Family History Hypertension: MOTHER  Family History High cholesterol:MOTHER  Family History Thyroid disease:MOTHER  Family History Osteoporosis: AUNT MOTHER'S SIDE  M-- dementia  Family History of Stroke F 1st degree relative   Social History:  Holiday representative  Married  Never Smoked  Alcohol use-yes  Drug use-no  Regular exercise-yes   Current Outpatient Prescriptions  Medication Sig Dispense Refill  . aspirin 81 MG tablet Take 81 mg by mouth every evening.        . Calcium Carbonate-Vitamin D (CALCIUM 600  + D PO) Take 1 tablet by mouth daily.        . Flaxseed, Linseed, (FLAX SEED OIL) 1000 MG CAPS Take 1 capsule by mouth daily.        Marland Kitchen levothyroxine (SYNTHROID, LEVOTHROID) 50 MCG tablet take 1 tablet by mouth once daily  30 tablet  5  . Multiple Vitamin (MULTIVITAMIN) tablet Take 1 tablet by mouth daily.        . Omega-3 Fatty Acids (FISH OIL) 1000 MG CAPS Take 1 capsule by mouth daily.        . pravastatin (PRAVACHOL) 20 MG tablet take 1 tablet by mouth every evening  30 tablet  2    BP 122/72  Ht 5\' 4"  (1.626 m)  Wt 136 lb (61.689 kg)  BMI 23.34 kg/m2 General: NAD Neck: No JVD, no thyromegaly or thyroid nodule.  Lungs: Clear to auscultation bilaterally with normal respiratory effort. CV: Nondisplaced PMI.  Heart regular S1/S2, no S3/S4, no murmur.  No peripheral edema.  No carotid bruit.  Normal pedal pulses.  Abdomen: Soft, nontender, no hepatosplenomegaly, no distention.  Neurologic: Alert and oriented x 3.  Psych: Normal affect. Extremities: No clubbing or cyanosis.   Assessment/Plan: 1. Right hand "numbness:" She notices this most at night.  She does, of note, have a history of carpal tunnel syndrome on the left.  I think that this numbness is more likely to be due to carpal tunnel syndrome that cardiac-related.  2.  Hyperlipidemia: Lipids  were acceptable when checked in 6/13.  3. Continue ASA 81 mg daily given family history.   Kameo Bains [employer]

## 2012-08-04 NOTE — Patient Instructions (Signed)
Your physician recommends that you schedule a follow-up appointment as needed with Dr McLean.  

## 2012-08-05 ENCOUNTER — Other Ambulatory Visit: Payer: Self-pay | Admitting: Cardiology

## 2012-08-06 DIAGNOSIS — R200 Anesthesia of skin: Secondary | ICD-10-CM | POA: Insufficient documentation

## 2012-08-16 NOTE — Addendum Note (Signed)
Addended by: Reine Just on: 08/16/2012 03:19 PM   Modules accepted: Orders

## 2012-11-09 ENCOUNTER — Other Ambulatory Visit: Payer: Self-pay

## 2012-11-09 MED ORDER — PRAVASTATIN SODIUM 20 MG PO TABS: 20.0000 mg | ORAL_TABLET | Freq: Every day | ORAL | Status: DC

## 2012-11-18 ENCOUNTER — Other Ambulatory Visit: Payer: Self-pay | Admitting: Family Medicine

## 2012-12-18 ENCOUNTER — Other Ambulatory Visit: Payer: Self-pay | Admitting: Family Medicine

## 2013-01-05 ENCOUNTER — Ambulatory Visit: Payer: Medicare Other | Admitting: Family Medicine

## 2013-01-18 ENCOUNTER — Encounter: Payer: Self-pay | Admitting: Internal Medicine

## 2013-01-18 ENCOUNTER — Encounter: Payer: Self-pay | Admitting: Family Medicine

## 2013-01-18 ENCOUNTER — Ambulatory Visit (INDEPENDENT_AMBULATORY_CARE_PROVIDER_SITE_OTHER): Payer: 59 | Admitting: Family Medicine

## 2013-01-18 VITALS — BP 108/70 | HR 60 | Temp 98.4°F | Wt 136.6 lb

## 2013-01-18 DIAGNOSIS — R319 Hematuria, unspecified: Secondary | ICD-10-CM

## 2013-01-18 DIAGNOSIS — E2839 Other primary ovarian failure: Secondary | ICD-10-CM

## 2013-01-18 DIAGNOSIS — E039 Hypothyroidism, unspecified: Secondary | ICD-10-CM

## 2013-01-18 DIAGNOSIS — Z0000 Encounter for general adult medical examination without abnormal findings: Secondary | ICD-10-CM

## 2013-01-18 DIAGNOSIS — E785 Hyperlipidemia, unspecified: Secondary | ICD-10-CM

## 2013-01-18 DIAGNOSIS — Z1239 Encounter for other screening for malignant neoplasm of breast: Secondary | ICD-10-CM

## 2013-01-18 LAB — POCT URINALYSIS DIPSTICK
Bilirubin, UA: NEGATIVE
Glucose, UA: NEGATIVE
Ketones, UA: NEGATIVE
Leukocytes, UA: NEGATIVE
Nitrite, UA: NEGATIVE
pH, UA: 6

## 2013-01-18 LAB — CBC WITH DIFFERENTIAL/PLATELET
Basophils Relative: 0.7 % (ref 0.0–3.0)
Eosinophils Absolute: 0.1 10*3/uL (ref 0.0–0.7)
Eosinophils Relative: 2.2 % (ref 0.0–5.0)
HCT: 33.6 % — ABNORMAL LOW (ref 36.0–46.0)
Hemoglobin: 11.2 g/dL — ABNORMAL LOW (ref 12.0–15.0)
Lymphs Abs: 1.2 10*3/uL (ref 0.7–4.0)
MCHC: 33.4 g/dL (ref 30.0–36.0)
MCV: 90.9 fl (ref 78.0–100.0)
Monocytes Absolute: 0.5 10*3/uL (ref 0.1–1.0)
Neutro Abs: 2.5 10*3/uL (ref 1.4–7.7)
RBC: 3.69 Mil/uL — ABNORMAL LOW (ref 3.87–5.11)

## 2013-01-18 LAB — BASIC METABOLIC PANEL
CO2: 27 mEq/L (ref 19–32)
Chloride: 103 mEq/L (ref 96–112)
Creatinine, Ser: 0.6 mg/dL (ref 0.4–1.2)
Potassium: 3.6 mEq/L (ref 3.5–5.1)

## 2013-01-18 LAB — HEPATIC FUNCTION PANEL
ALT: 22 U/L (ref 0–35)
Albumin: 3.9 g/dL (ref 3.5–5.2)
Total Protein: 7.1 g/dL (ref 6.0–8.3)

## 2013-01-18 LAB — LIPID PANEL
HDL: 85.4 mg/dL (ref >39.00)
Triglycerides: 46 mg/dL (ref 0.0–149.0)

## 2013-01-18 NOTE — Assessment & Plan Note (Signed)
Check labs Cont' meds 

## 2013-01-18 NOTE — Assessment & Plan Note (Signed)
Check labs con't meds 

## 2013-01-18 NOTE — Progress Notes (Signed)
Subjective:    Nancy Cuevas is a 67 y.o. female who presents for Medicare Annual/Subsequent preventive examination.  Preventive Screening-Counseling & Management  Tobacco History  Smoking status  . Never Smoker   Smokeless tobacco  . Never Used     Problems Prior to Visit 1.   Current Problems (verified) Patient Active Problem List  Diagnosis  . HYPOTHYROIDISM  . POSTMENOPAUSAL STATUS  . Contact dermatitis and other eczema, due to unspecified cause  . BUNION, RIGHT FOOT  . OSTEOPENIA  . PALPITATIONS, OCCASIONAL  . HYPERLIPIDEMIA  . HYPERPOTASSEMIA  . FEMALE STRESS INCONTINENCE  . ECHOCARDIOGRAM, ABNORMAL  . UTI (lower urinary tract infection)  . Numbness    Medications Prior to Visit Current Outpatient Prescriptions on File Prior to Visit  Medication Sig Dispense Refill  . aspirin 81 MG tablet Take 81 mg by mouth every evening.        . Calcium Carbonate-Vitamin D (CALCIUM 600 + D PO) Take 1 tablet by mouth daily.        . Flaxseed, Linseed, (FLAX SEED OIL) 1000 MG CAPS Take 1 capsule by mouth daily.        Marland Kitchen levothyroxine (SYNTHROID, LEVOTHROID) 50 MCG tablet take 1 tablet by mouth once daily  30 tablet  5  . Multiple Vitamin (MULTIVITAMIN) tablet Take 1 tablet by mouth daily.        . Omega-3 Fatty Acids (FISH OIL) 1000 MG CAPS Take 1 capsule by mouth daily.        . pravastatin (PRAVACHOL) 20 MG tablet Take 1 tablet (20 mg total) by mouth daily.  30 tablet  2   No current facility-administered medications on file prior to visit.    Current Medications (verified) Current Outpatient Prescriptions  Medication Sig Dispense Refill  . aspirin 81 MG tablet Take 81 mg by mouth every evening.        . Calcium Carbonate-Vitamin D (CALCIUM 600 + D PO) Take 1 tablet by mouth daily.        . Flaxseed, Linseed, (FLAX SEED OIL) 1000 MG CAPS Take 1 capsule by mouth daily.        Marland Kitchen levothyroxine (SYNTHROID, LEVOTHROID) 50 MCG tablet take 1 tablet by mouth once daily  30  tablet  5  . Multiple Vitamin (MULTIVITAMIN) tablet Take 1 tablet by mouth daily.        . Omega-3 Fatty Acids (FISH OIL) 1000 MG CAPS Take 1 capsule by mouth daily.        . pravastatin (PRAVACHOL) 20 MG tablet Take 1 tablet (20 mg total) by mouth daily.  30 tablet  2   No current facility-administered medications for this visit.     Allergies (verified) Sulfonamide derivatives   PAST HISTORY  Family History Family History  Problem Relation Age of Onset  . Breast cancer      1st degree relative <50  . Diabetes      1st degree relative  . Osteoporosis      1st degree relative    Social History History  Substance Use Topics  . Smoking status: Never Smoker   . Smokeless tobacco: Never Used  . Alcohol Use: 1.8 oz/week    3 Glasses of wine per week     Are there smokers in your home (other than you)? No  Risk Factors Current exercise habits: The patient does not participate in regular exercise at present.  Dietary issues discussed: none   Cardiac risk factors: advanced age (older than  55 for men, 65 for women), dyslipidemia and sedentary lifestyle.  Depression Screen (Note: if answer to either of the following is "Yes", a more complete depression screening is indicated)   Over the past two weeks, have you felt down, depressed or hopeless? No  Over the past two weeks, have you felt little interest or pleasure in doing things? No  Have you lost interest or pleasure in daily life? No  Do you often feel hopeless? No  Do you cry easily over simple problems? No  Activities of Daily Living In your present state of health, do you have any difficulty performing the following activities?:  Driving? No Managing money?  No Feeding yourself? No Getting from bed to chair? No Climbing a flight of stairs? No Preparing food and eating?: no Bathing or showering? No Getting dressed: No Getting to the toilet? No Using the toilet:No Moving around from place to place: No In the  past year have you fallen or had a near fall?:No   Are you sexually active?  Yes  Do you have more than one partner?  No  Hearing Difficulties: No Do you often ask people to speak up or repeat themselves? No Do you experience ringing or noises in your ears? No Do you have difficulty understanding soft or whispered voices? No   Do you feel that you have a problem with memory? No  Do you often misplace items? No  Do you feel safe at home?  No  Cognitive Testing  Alert? Yes  Normal Appearance?Yes  Oriented to person? Yes  Place? Yes   Time? Yes  Recall of three objects?  Yes  Can perform simple calculations? Yes  Displays appropriate judgment?Yes  Can read the correct time from a watch face?Yes   Advanced Directives have been discussed with the patient? Yes  List the Names of Other Physician/Practitioners you currently use: 1.  Derm-- [facility] 2   Card---[facility]   Indicate any recent Medical Services you may have received from other than [facility] providers in the past year (date may be approximate).  Immunization History  Administered Date(s) Administered  . Influenza Split 09/17/2011  . Influenza Whole 08/30/2007, 07/31/2008, 09/02/2010, 09/01/2012  . Pneumococcal Polysaccharide 09/21/2010  . Td 02/07/2008  . Zoster 02/07/2008    Screening Tests Health Maintenance  Topic Date Due  . Colonoscopy  05/04/2011  . Mammogram  05/18/2013  . Influenza Vaccine  06/18/2013  . Tetanus/tdap  02/06/2018  . Pneumococcal Polysaccharide Vaccine Age 108 And Over  Completed  . Zostavax  Completed    All answers were reviewed with the patient and necessary referrals were made:  Loreen Freud, DO   01/18/2013   History reviewed:  She  has a past medical history of POSTMENOPAUSAL STATUS (02/07/2008); OSTEOPENIA (03/24/2008); HYPOTHYROIDISM (07/18/2007); HYPERLIPIDEMIA (10/27/2010); Bunion; Contact dermatitis and other eczema, due to unspecified cause; Nonspecific (abnormal) findings on  radiological and other examination of other intrathoracic organs; Contact dermatitis and other eczema, due to unspecified cause; Female stress incontinence; Hyperpotassemia; Disorder of bone and cartilage, unspecified; Palpitations; and Symptomatic menopausal or female climacteric states. She  does not have any pertinent problems on file. She  has past surgical history that includes left wrist hardware removal 10/12 (10/12); Retinal detachment surgery (2009); Fracture surgery (june 2006); Cataract extraction (2011); Carpal tunnel release (11/15/2011); Hand surgery (oct 2012 and jan 2013); natural birth (1962, 1972); and Abdominal hysterectomy (1988). Her family history includes Breast cancer in an unspecified family member; Diabetes in an unspecified  family member; and Osteoporosis in an unspecified family member. She  reports that she has never smoked. She has never used smokeless tobacco. She reports that she drinks about 1.8 ounces of alcohol per week. She reports that she does not use illicit drugs. She has a current medication list which includes the following prescription(s): aspirin, calcium carbonate-vitamin d, flax seed oil, levothyroxine, multivitamin, fish oil, and pravastatin. Current Outpatient Prescriptions on File Prior to Visit  Medication Sig Dispense Refill  . aspirin 81 MG tablet Take 81 mg by mouth every evening.        . Calcium Carbonate-Vitamin D (CALCIUM 600 + D PO) Take 1 tablet by mouth daily.        . Flaxseed, Linseed, (FLAX SEED OIL) 1000 MG CAPS Take 1 capsule by mouth daily.        Marland Kitchen levothyroxine (SYNTHROID, LEVOTHROID) 50 MCG tablet take 1 tablet by mouth once daily  30 tablet  5  . Multiple Vitamin (MULTIVITAMIN) tablet Take 1 tablet by mouth daily.        . Omega-3 Fatty Acids (FISH OIL) 1000 MG CAPS Take 1 capsule by mouth daily.        . pravastatin (PRAVACHOL) 20 MG tablet Take 1 tablet (20 mg total) by mouth daily.  30 tablet  2   No current facility-administered  medications on file prior to visit.   She is allergic to sulfonamide derivatives.  Review of Systems Review of Systems  Constitutional: Negative for activity change, appetite change and fatigue.  HENT: Negative for hearing loss, congestion, tinnitus and ear discharge.  dentist q46m Eyes: Negative for visual disturbance (see optho q1y -- vision corrected to 20/20 with glasses).  Respiratory: Negative for cough, chest tightness and shortness of breath.   Cardiovascular: Negative for chest pain, palpitations and leg swelling.  Gastrointestinal: Negative for abdominal pain, diarrhea, constipation and abdominal distention.  Genitourinary: Negative for urgency, frequency, decreased urine volume and difficulty urinating.  Musculoskeletal: Negative for back pain, arthralgias and gait problem.  Skin: Negative for color change, pallor and rash.  Neurological: Negative for dizziness, light-headedness, numbness and headaches.  Hematological: Negative for adenopathy. Does not bruise/bleed easily.  Psychiatric/Behavioral: Negative for suicidal ideas, confusion, sleep disturbance, self-injury, dysphoric mood, decreased concentration and agitation.        Objective:     Vision by Snellen chart: opth  Body mass index is 23.44 kg/(m^2). BP 108/70  Pulse 60  Temp(Src) 98.4 F (36.9 C) (Oral)  Wt 136 lb 9.6 oz (61.961 kg)  BMI 23.44 kg/m2  SpO2 96%  BP 108/70  Pulse 60  Temp(Src) 98.4 F (36.9 C) (Oral)  Wt 136 lb 9.6 oz (61.961 kg)  BMI 23.44 kg/m2  SpO2 96% General appearance: alert, cooperative, appears stated age and no distress Head: Normocephalic, without obvious abnormality, atraumatic Eyes: conjunctivae/corneas clear. PERRL, EOM's intact. Fundi benign. Ears: normal TM's and external ear canals both ears Nose: Nares normal. Septum midline. Mucosa normal. No drainage or sinus tenderness. Throat: lips, mucosa, and tongue normal; teeth and gums normal Neck: no adenopathy, no carotid  bruit, no JVD, supple, symmetrical, trachea midline and thyroid not enlarged, symmetric, no tenderness/mass/nodules Back: symmetric, no curvature. ROM normal. No CVA tenderness. Lungs: clear to auscultation bilaterally Breasts: normal appearance, no masses or tenderness Heart: regular rate and rhythm, S1, S2 normal, no murmur, click, rub or gallop Abdomen: soft, non-tender; bowel sounds normal; no masses,  no organomegaly Pelvic: not indicated; status post hysterectomy, negative ROS Extremities: extremities normal,  atraumatic, no cyanosis or edema Pulses: 2+ and symmetric Skin: Skin color, texture, turgor normal. No rashes or lesions Lymph nodes: Cervical, supraclavicular, and axillary nodes normal. Neurologic: Alert and oriented X 3, normal strength and tone. Normal symmetric reflexes. Normal coordination and gait Psych-- no anxiety, no depression      Assessment:    cpe    Plan:     During the course of the visit the patient was educated and counseled about appropriate screening and preventive services including:    Pneumococcal vaccine   Influenza vaccine  Screening electrocardiogram  Screening mammography  Bone densitometry screening  Colorectal cancer screening  Diabetes screening  Glaucoma screening  Advanced directives: has an advanced directive - a copy HAS NOT been provided.  Diet review for nutrition referral? Yes ____  Not Indicated ___x_   Patient Instructions (the written plan) was given to the patient.  Medicare Attestation I have personally reviewed: The patient's medical and social history Their use of alcohol, tobacco or illicit drugs Their current medications and supplements The patient's functional ability including ADLs,fall risks, home safety risks, cognitive, and hearing and visual impairment Diet and physical activities Evidence for depression or mood disorders  The patient's weight, height, BMI, and visual acuity have been recorded in  the chart.  I have made referrals, counseling, and provided education to the patient based on review of the above and I have provided the patient with a written personalized care plan for preventive services.     Loreen Freud, DO   01/18/2013

## 2013-01-18 NOTE — Patient Instructions (Signed)
Preventive Care for Adults, Female A healthy lifestyle and preventive care can promote health and wellness. Preventive health guidelines for women include the following key practices.  A routine yearly physical is a good way to check with your caregiver about your health and preventive screening. It is a chance to share any concerns and updates on your health, and to receive a thorough exam.  Visit your dentist for a routine exam and preventive care every 6 months. Brush your teeth twice a day and floss once a day. Good oral hygiene prevents tooth decay and gum disease.  The frequency of eye exams is based on your age, health, family medical history, use of contact lenses, and other factors. Follow your caregiver's recommendations for frequency of eye exams.  Eat a healthy diet. Foods like vegetables, fruits, whole grains, low-fat dairy products, and lean protein foods contain the nutrients you need without too many calories. Decrease your intake of foods high in solid fats, added sugars, and salt. Eat the right amount of calories for you.Get information about a proper diet from your caregiver, if necessary.  Regular physical exercise is one of the most important things you can do for your health. Most adults should get at least 150 minutes of moderate-intensity exercise (any activity that increases your heart rate and causes you to sweat) each week. In addition, most adults need muscle-strengthening exercises on 2 or more days a week.  Maintain a healthy weight. The body mass index (BMI) is a screening tool to identify possible weight problems. It provides an estimate of body fat based on height and weight. Your caregiver can help determine your BMI, and can help you achieve or maintain a healthy weight.For adults 20 years and older:  A BMI below 18.5 is considered underweight.  A BMI of 18.5 to 24.9 is normal.  A BMI of 25 to 29.9 is considered overweight.  A BMI of 30 and above is  considered obese.  Maintain normal blood lipids and cholesterol levels by exercising and minimizing your intake of saturated fat. Eat a balanced diet with plenty of fruit and vegetables. Blood tests for lipids and cholesterol should begin at age 20 and be repeated every 5 years. If your lipid or cholesterol levels are high, you are over 50, or you are at high risk for heart disease, you may need your cholesterol levels checked more frequently.Ongoing high lipid and cholesterol levels should be treated with medicines if diet and exercise are not effective.  If you smoke, find out from your caregiver how to quit. If you do not use tobacco, do not start.  If you are pregnant, do not drink alcohol. If you are breastfeeding, be very cautious about drinking alcohol. If you are not pregnant and choose to drink alcohol, do not exceed 1 drink per day. One drink is considered to be 12 ounces (355 mL) of beer, 5 ounces (148 mL) of wine, or 1.5 ounces (44 mL) of liquor.  Avoid use of street drugs. Do not share needles with anyone. Ask for help if you need support or instructions about stopping the use of drugs.  High blood pressure causes heart disease and increases the risk of stroke. Your blood pressure should be checked at least every 1 to 2 years. Ongoing high blood pressure should be treated with medicines if weight loss and exercise are not effective.  If you are 55 to 69 years old, ask your caregiver if you should take aspirin to prevent strokes.  Diabetes   screening involves taking a blood sample to check your fasting blood sugar level. This should be done once every 3 years, after age 45, if you are within normal weight and without risk factors for diabetes. Testing should be considered at a younger age or be carried out more frequently if you are overweight and have at least 1 risk factor for diabetes.  Breast cancer screening is essential preventive care for women. You should practice "breast  self-awareness." This means understanding the normal appearance and feel of your breasts and may include breast self-examination. Any changes detected, no matter how small, should be reported to a caregiver. Women in their 20s and 30s should have a clinical breast exam (CBE) by a caregiver as part of a regular health exam every 1 to 3 years. After age 40, women should have a CBE every year. Starting at age 40, women should consider having a mammography (breast X-ray test) every year. Women who have a family history of breast cancer should talk to their caregiver about genetic screening. Women at a high risk of breast cancer should talk to their caregivers about having magnetic resonance imaging (MRI) and a mammography every year.  The Pap test is a screening test for cervical cancer. A Pap test can show cell changes on the cervix that might become cervical cancer if left untreated. A Pap test is a procedure in which cells are obtained and examined from the lower end of the uterus (cervix).  Women should have a Pap test starting at age 21.  Between ages 21 and 29, Pap tests should be repeated every 2 years.  Beginning at age 30, you should have a Pap test every 3 years as long as the past 3 Pap tests have been normal.  Some women have medical problems that increase the chance of getting cervical cancer. Talk to your caregiver about these problems. It is especially important to talk to your caregiver if a new problem develops soon after your last Pap test. In these cases, your caregiver may recommend more frequent screening and Pap tests.  The above recommendations are the same for women who have or have not gotten the vaccine for human papillomavirus (HPV).  If you had a hysterectomy for a problem that was not cancer or a condition that could lead to cancer, then you no longer need Pap tests. Even if you no longer need a Pap test, a regular exam is a good idea to make sure no other problems are  starting.  If you are between ages 65 and 70, and you have had normal Pap tests going back 10 years, you no longer need Pap tests. Even if you no longer need a Pap test, a regular exam is a good idea to make sure no other problems are starting.  If you have had past treatment for cervical cancer or a condition that could lead to cancer, you need Pap tests and screening for cancer for at least 20 years after your treatment.  If Pap tests have been discontinued, risk factors (such as a new sexual partner) need to be reassessed to determine if screening should be resumed.  The HPV test is an additional test that may be used for cervical cancer screening. The HPV test looks for the virus that can cause the cell changes on the cervix. The cells collected during the Pap test can be tested for HPV. The HPV test could be used to screen women aged 30 years and older, and should   be used in women of any age who have unclear Pap test results. After the age of 30, women should have HPV testing at the same frequency as a Pap test.  Colorectal cancer can be detected and often prevented. Most routine colorectal cancer screening begins at the age of 50 and continues through age 75. However, your caregiver may recommend screening at an earlier age if you have risk factors for colon cancer. On a yearly basis, your caregiver may provide home test kits to check for hidden blood in the stool. Use of a small camera at the end of a tube, to directly examine the colon (sigmoidoscopy or colonoscopy), can detect the earliest forms of colorectal cancer. Talk to your caregiver about this at age 50, when routine screening begins. Direct examination of the colon should be repeated every 5 to 10 years through age 75, unless early forms of pre-cancerous polyps or small growths are found.  Hepatitis C blood testing is recommended for all people born from 1945 through 1965 and any individual with known risks for hepatitis C.  Practice  safe sex. Use condoms and avoid high-risk sexual practices to reduce the spread of sexually transmitted infections (STIs). STIs include gonorrhea, chlamydia, syphilis, trichomonas, herpes, HPV, and human immunodeficiency virus (HIV). Herpes, HIV, and HPV are viral illnesses that have no cure. They can result in disability, cancer, and death. Sexually active women aged 25 and younger should be checked for chlamydia. Older women with new or multiple partners should also be tested for chlamydia. Testing for other STIs is recommended if you are sexually active and at increased risk.  Osteoporosis is a disease in which the bones lose minerals and strength with aging. This can result in serious bone fractures. The risk of osteoporosis can be identified using a bone density scan. Women ages 65 and over and women at risk for fractures or osteoporosis should discuss screening with their caregivers. Ask your caregiver whether you should take a calcium supplement or vitamin D to reduce the rate of osteoporosis.  Menopause can be associated with physical symptoms and risks. Hormone replacement therapy is available to decrease symptoms and risks. You should talk to your caregiver about whether hormone replacement therapy is right for you.  Use sunscreen with sun protection factor (SPF) of 30 or more. Apply sunscreen liberally and repeatedly throughout the day. You should seek shade when your shadow is shorter than you. Protect yourself by wearing long sleeves, pants, a wide-brimmed hat, and sunglasses year round, whenever you are outdoors.  Once a month, do a whole body skin exam, using a mirror to look at the skin on your back. Notify your caregiver of new moles, moles that have irregular borders, moles that are larger than a pencil eraser, or moles that have changed in shape or color.  Stay current with required immunizations.  Influenza. You need a dose every fall (or winter). The composition of the flu vaccine  changes each year, so being vaccinated once is not enough.  Pneumococcal polysaccharide. You need 1 to 2 doses if you smoke cigarettes or if you have certain chronic medical conditions. You need 1 dose at age 65 (or older) if you have never been vaccinated.  Tetanus, diphtheria, pertussis (Tdap, Td). Get 1 dose of Tdap vaccine if you are younger than age 65, are over 65 and have contact with an infant, are a healthcare worker, are pregnant, or simply want to be protected from whooping cough. After that, you need a Td   booster dose every 10 years. Consult your caregiver if you have not had at least 3 tetanus and diphtheria-containing shots sometime in your life or have a deep or dirty wound.  HPV. You need this vaccine if you are a woman age 26 or younger. The vaccine is given in 3 doses over 6 months.  Measles, mumps, rubella (MMR). You need at least 1 dose of MMR if you were born in 1957 or later. You may also need a second dose.  Meningococcal. If you are age 19 to 21 and a first-year college student living in a residence hall, or have one of several medical conditions, you need to get vaccinated against meningococcal disease. You may also need additional booster doses.  Zoster (shingles). If you are age 60 or older, you should get this vaccine.  Varicella (chickenpox). If you have never had chickenpox or you were vaccinated but received only 1 dose, talk to your caregiver to find out if you need this vaccine.  Hepatitis A. You need this vaccine if you have a specific risk factor for hepatitis A virus infection or you simply wish to be protected from this disease. The vaccine is usually given as 2 doses, 6 to 18 months apart.  Hepatitis B. You need this vaccine if you have a specific risk factor for hepatitis B virus infection or you simply wish to be protected from this disease. The vaccine is given in 3 doses, usually over 6 months. Preventive Services / Frequency Ages 19 to 39  Blood  pressure check.** / Every 1 to 2 years.  Lipid and cholesterol check.** / Every 5 years beginning at age 20.  Clinical breast exam.** / Every 3 years for women in their 20s and 30s.  Pap test.** / Every 2 years from ages 21 through 29. Every 3 years starting at age 30 through age 65 or 70 with a history of 3 consecutive normal Pap tests.  HPV screening.** / Every 3 years from ages 30 through ages 65 to 70 with a history of 3 consecutive normal Pap tests.  Hepatitis C blood test.** / For any individual with known risks for hepatitis C.  Skin self-exam. / Monthly.  Influenza immunization.** / Every year.  Pneumococcal polysaccharide immunization.** / 1 to 2 doses if you smoke cigarettes or if you have certain chronic medical conditions.  Tetanus, diphtheria, pertussis (Tdap, Td) immunization. / A one-time dose of Tdap vaccine. After that, you need a Td booster dose every 10 years.  HPV immunization. / 3 doses over 6 months, if you are 26 and younger.  Measles, mumps, rubella (MMR) immunization. / You need at least 1 dose of MMR if you were born in 1957 or later. You may also need a second dose.  Meningococcal immunization. / 1 dose if you are age 19 to 21 and a first-year college student living in a residence hall, or have one of several medical conditions, you need to get vaccinated against meningococcal disease. You may also need additional booster doses.  Varicella immunization.** / Consult your caregiver.  Hepatitis A immunization.** / Consult your caregiver. 2 doses, 6 to 18 months apart.  Hepatitis B immunization.** / Consult your caregiver. 3 doses usually over 6 months. Ages 40 to 64  Blood pressure check.** / Every 1 to 2 years.  Lipid and cholesterol check.** / Every 5 years beginning at age 20.  Clinical breast exam.** / Every year after age 40.  Mammogram.** / Every year beginning at age 40   and continuing for as long as you are in good health. Consult with your  caregiver.  Pap test.** / Every 3 years starting at age 30 through age 65 or 70 with a history of 3 consecutive normal Pap tests.  HPV screening.** / Every 3 years from ages 30 through ages 65 to 70 with a history of 3 consecutive normal Pap tests.  Fecal occult blood test (FOBT) of stool. / Every year beginning at age 50 and continuing until age 75. You may not need to do this test if you get a colonoscopy every 10 years.  Flexible sigmoidoscopy or colonoscopy.** / Every 5 years for a flexible sigmoidoscopy or every 10 years for a colonoscopy beginning at age 50 and continuing until age 75.  Hepatitis C blood test.** / For all people born from 1945 through 1965 and any individual with known risks for hepatitis C.  Skin self-exam. / Monthly.  Influenza immunization.** / Every year.  Pneumococcal polysaccharide immunization.** / 1 to 2 doses if you smoke cigarettes or if you have certain chronic medical conditions.  Tetanus, diphtheria, pertussis (Tdap, Td) immunization.** / A one-time dose of Tdap vaccine. After that, you need a Td booster dose every 10 years.  Measles, mumps, rubella (MMR) immunization. / You need at least 1 dose of MMR if you were born in 1957 or later. You may also need a second dose.  Varicella immunization.** / Consult your caregiver.  Meningococcal immunization.** / Consult your caregiver.  Hepatitis A immunization.** / Consult your caregiver. 2 doses, 6 to 18 months apart.  Hepatitis B immunization.** / Consult your caregiver. 3 doses, usually over 6 months. Ages 65 and over  Blood pressure check.** / Every 1 to 2 years.  Lipid and cholesterol check.** / Every 5 years beginning at age 20.  Clinical breast exam.** / Every year after age 40.  Mammogram.** / Every year beginning at age 40 and continuing for as long as you are in good health. Consult with your caregiver.  Pap test.** / Every 3 years starting at age 30 through age 65 or 70 with a 3  consecutive normal Pap tests. Testing can be stopped between 65 and 70 with 3 consecutive normal Pap tests and no abnormal Pap or HPV tests in the past 10 years.  HPV screening.** / Every 3 years from ages 30 through ages 65 or 70 with a history of 3 consecutive normal Pap tests. Testing can be stopped between 65 and 70 with 3 consecutive normal Pap tests and no abnormal Pap or HPV tests in the past 10 years.  Fecal occult blood test (FOBT) of stool. / Every year beginning at age 50 and continuing until age 75. You may not need to do this test if you get a colonoscopy every 10 years.  Flexible sigmoidoscopy or colonoscopy.** / Every 5 years for a flexible sigmoidoscopy or every 10 years for a colonoscopy beginning at age 50 and continuing until age 75.  Hepatitis C blood test.** / For all people born from 1945 through 1965 and any individual with known risks for hepatitis C.  Osteoporosis screening.** / A one-time screening for women ages 65 and over and women at risk for fractures or osteoporosis.  Skin self-exam. / Monthly.  Influenza immunization.** / Every year.  Pneumococcal polysaccharide immunization.** / 1 dose at age 65 (or older) if you have never been vaccinated.  Tetanus, diphtheria, pertussis (Tdap, Td) immunization. / A one-time dose of Tdap vaccine if you are over   65 and have contact with an infant, are a healthcare worker, or simply want to be protected from whooping cough. After that, you need a Td booster dose every 10 years.  Varicella immunization.** / Consult your caregiver.  Meningococcal immunization.** / Consult your caregiver.  Hepatitis A immunization.** / Consult your caregiver. 2 doses, 6 to 18 months apart.  Hepatitis B immunization.** / Check with your caregiver. 3 doses, usually over 6 months. ** Family history and personal history of risk and conditions may change your caregiver's recommendations. Document Released: 11/30/2001 Document Revised: 12/27/2011  Document Reviewed: 03/01/2011 [employer] Patient Information 2013 [employer].  

## 2013-01-18 NOTE — Addendum Note (Signed)
Addended by: Silvio Pate D on: 01/18/2013 04:30 PM   Modules accepted: Orders

## 2013-01-22 LAB — URINE CULTURE

## 2013-02-06 ENCOUNTER — Other Ambulatory Visit: Payer: Self-pay | Admitting: Cardiology

## 2013-02-22 ENCOUNTER — Ambulatory Visit
Admission: RE | Admit: 2013-02-22 | Discharge: 2013-02-22 | Disposition: A | Discharge status: Home / Self Care | Payer: Medicare Other | Source: Ambulatory Visit | Attending: Family Medicine | Admitting: Family Medicine

## 2013-02-22 ENCOUNTER — Telehealth: Payer: Self-pay | Admitting: *Deleted

## 2013-02-22 ENCOUNTER — Ambulatory Visit (AMBULATORY_SURGERY_CENTER): Payer: Medicare Other | Admitting: *Deleted

## 2013-02-22 VITALS — Ht 64.0 in | Wt 136.6 lb

## 2013-02-22 DIAGNOSIS — Z1239 Encounter for other screening for malignant neoplasm of breast: Secondary | ICD-10-CM

## 2013-02-22 DIAGNOSIS — E2839 Other primary ovarian failure: Secondary | ICD-10-CM

## 2013-02-22 DIAGNOSIS — Z1211 Encounter for screening for malignant neoplasm of colon: Secondary | ICD-10-CM

## 2013-02-22 MED ORDER — NA SULFATE-K SULFATE-MG SULF 17.5-3.13-1.6 GM/177ML PO SOLN: 1.0000 | Freq: Once | ORAL | Status: DC

## 2013-02-22 NOTE — Progress Notes (Signed)
No egg or soy allergy. ewm No problems with sedation in the past. ewm No home oxygen use/ ewm  

## 2013-02-22 NOTE — Telephone Encounter (Signed)
Called pt and left detailed message on voice mail of cell phone to stop her Iron 03-10-13 which is 5 days prior to her procedure on 03-15-13. Left number to PV room 50 and also to  GI office to return call if she has any questions. ewm

## 2013-02-23 ENCOUNTER — Encounter: Payer: Self-pay | Admitting: Internal Medicine

## 2013-02-27 ENCOUNTER — Telehealth: Payer: Self-pay | Admitting: *Deleted

## 2013-02-27 NOTE — Telephone Encounter (Signed)
Faxed ROI to Dr. Emelda Brothers in [place] at Fax# [phone].  Rec'd records shortly after and put them in Dr. Teresita Madura office for him to review.

## 2013-02-27 NOTE — Telephone Encounter (Signed)
Pt scheduled for direct screening colonoscopy with Dr. Leone Payor on 5/29 with Dr. Leone Payor.  Last colonoscopy 2003 at [facility] in [place], [place].  Release of information form signed and given to Patti [place], CMA.

## 2013-03-15 ENCOUNTER — Ambulatory Visit (AMBULATORY_SURGERY_CENTER): Payer: Medicare Other | Admitting: Internal Medicine

## 2013-03-15 ENCOUNTER — Encounter: Payer: Self-pay | Admitting: Internal Medicine

## 2013-03-15 VITALS — BP 140/72 | HR 60 | Temp 97.1°F | Resp 16 | Ht 64.0 in | Wt 136.0 lb

## 2013-03-15 DIAGNOSIS — Z1211 Encounter for screening for malignant neoplasm of colon: Secondary | ICD-10-CM

## 2013-03-15 DIAGNOSIS — D129 Benign neoplasm of anus and anal canal: Secondary | ICD-10-CM | POA: Insufficient documentation

## 2013-03-15 DIAGNOSIS — D126 Benign neoplasm of colon, unspecified: Secondary | ICD-10-CM

## 2013-03-15 DIAGNOSIS — D128 Benign neoplasm of rectum: Secondary | ICD-10-CM | POA: Insufficient documentation

## 2013-03-15 HISTORY — DX: Benign neoplasm of rectum: D12.8

## 2013-03-15 MED ORDER — SODIUM CHLORIDE 0.9 % IV SOLN: 500.0000 mL | INTRAVENOUS | Status: DC

## 2013-03-15 NOTE — Patient Instructions (Addendum)
There is a large flat polyp in the rectum - biopsies taken but not removed. This will need removal - we will need to determine the best approach for you. We will schedule a visit after pathology returns. I think it is benign.  There was another tiny polyp removed completely.  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  I appreciate the opportunity to care for you.  Iva Boop, MD, FACG  YOU HAD AN ENDOSCOPIC PROCEDURE TODAY AT THE [facility]: Refer to the procedure report that was given to you for any specific questions about what was found during the examination.  If the procedure report does not answer your questions, please call your gastroenterologist to clarify.  If you requested that your care partner not be given the details of your procedure findings, then the procedure report has been included in a sealed envelope for you to review at your convenience later.  YOU SHOULD EXPECT: Some feelings of bloating in the abdomen. Passage of more gas than usual.  Walking can help get rid of the air that was put into your GI tract during the procedure and reduce the bloating. If you had a lower endoscopy (such as a colonoscopy or flexible sigmoidoscopy) you may notice spotting of blood in your stool or on the toilet paper. If you underwent a bowel prep for your procedure, then you may not have a normal bowel movement for a few days.  DIET: Your first meal following the procedure should be a light meal and then it is ok to progress to your normal diet.  A half-sandwich or bowl of soup is an example of a good first meal.  Heavy or fried foods are harder to digest and may make you feel nauseous or bloated.  Likewise meals heavy in dairy and vegetables can cause extra gas to form and this can also increase the bloating.  Drink plenty of fluids but you should avoid alcoholic beverages for 24 hours.  ACTIVITY: Your care partner should take you home directly after  the procedure.  You should plan to take it easy, moving slowly for the rest of the day.  You can resume normal activity the day after the procedure however you should NOT DRIVE or use heavy machinery for 24 hours (because of the sedation medicines used during the test).    SYMPTOMS TO REPORT IMMEDIATELY: A gastroenterologist can be reached at any hour.  During normal business hours, 8:30 AM to 5:00 PM Monday through Friday, call [phone].  After hours and on weekends, please call the GI answering service at [phone] who will take a message and have the physician on call contact you.   Following lower endoscopy (colonoscopy or flexible sigmoidoscopy):  Excessive amounts of blood in the stool  Significant tenderness or worsening of abdominal pains  Swelling of the abdomen that is new, acute  Fever of 100F or higher  FOLLOW UP: If any biopsies were taken you will be contacted by phone or by letter within the next 1-3 weeks.  Call your gastroenterologist if you have not heard about the biopsies in 3 weeks.  Our staff will call the home number listed on your records the next business day following your procedure to check on you and address any questions or concerns that you may have at that time regarding the information given to you following your procedure. This is a courtesy call and so if there is no answer at  the home number and we have not heard from you through the emergency physician on call, we will assume that you have returned to your regular daily activities without incident.  SIGNATURES/CONFIDENTIALITY: You and/or your care partner have signed paperwork which will be entered into your electronic medical record.  These signatures attest to the fact that that the information above on your After Visit Summary has been reviewed and is understood.  Full responsibility of the confidentiality of this discharge information lies with you and/or your care-partner.  Polyp-handout  given  We will schedule a visit after pathology comes back

## 2013-03-15 NOTE — Op Note (Signed)
[facility] [address] [place], [address]   COLONOSCOPY PROCEDURE REPORT  PATIENT: Nancy Cuevas, Nancy Cuevas  MR#: [id number] BIRTHDATE: Mar 15, 1946 , 66  yrs. old GENDER: Female ENDOSCOPIST: Iva Boop, MD, [facility] REFERRED HQ:[facility] [place], DO PROCEDURE DATE:  03/15/2013 PROCEDURE:   Colonoscopy with biopsy and snare polypectomy ASA CLASS:   Class II INDICATIONS:average risk screening and Last colonoscopy performed 2003. MEDICATIONS: Propofol (Diprivan) 270 mg IV, MAC sedation, administered by CRNA, and These medications were titrated to patient response per physician's verbal order  DESCRIPTION OF PROCEDURE:   After the risks benefits and alternatives of the procedure were thoroughly explained, informed consent was obtained.  A digital rectal exam revealed no abnormalities of the rectum.   The [id number]  endoscope was introduced through the anus and advanced to the cecum, which was identified by both the appendix and ileocecal valve. No adverse events experienced.   The quality of the prep was excellent using Suprep  The instrument was then slowly withdrawn as the colon was fully examined.      COLON FINDINGS: A large polypoid shaped flat polyp was found in the rectum. 3+ cm. Distal location adjacent to anal verge.  Multiple biopsies were performed using cold forceps.   A diminutive sessile polyp was found in the transverse colon.  A polypectomy was performed with a cold snare.  The resection was complete and the polyp tissue was completely retrieved.   The colon mucosa was otherwise normal.   A right colon retroflexion was performed. Retroflexed views revealed no abnormalities. The time to cecum=13 minutes 24 seconds.  Withdrawal time=14 minutes 34 seconds.  The scope was withdrawn and the procedure completed. COMPLICATIONS: There were no complications.  ENDOSCOPIC IMPRESSION: 1.   Large flat polyp (3+cm)was found in the rectum; multiple biopsies  were performed using cold forceps 2.   Diminutive sessile polyp was found in the transverse colon; polypectomy was performed with a cold snare 3.   The colon mucosa was otherwise normal - excellent prep. Colon is redundant.  RECOMMENDATIONS: 1.  Office will call with the results/recommendations 2.   Will need endoscopic vs.  surgical removal of rectal polyp.   eSigned:  Iva Boop, MD, [facility] 03/15/2013 9:21 AM cc: Lelon Perla, DO and The Patient

## 2013-03-15 NOTE — Progress Notes (Signed)
A/ox3 pleased with MAC report to RN 

## 2013-03-15 NOTE — Progress Notes (Signed)
Patient did not experience any of the following events: a burn prior to discharge; a fall within the facility; wrong site/side/patient/procedure/implant event; or a hospital transfer or hospital admission upon discharge from the facility. (G8907)Patient did not have preoperative order for IV antibiotic SSI prophylaxis. [phone])  Pt has not passed any air in recovery, pt abd is soft non-tender, pt states she does not feel bloated or like she has to pass any air, encouraged pt to pass any air while in recovery,pt did finally pass some air before last vitals were taken, pt still denies any discomfort or bloating, abd still soft non-tender

## 2013-03-15 NOTE — Progress Notes (Signed)
Called to room to assist during endoscopic procedure.  Patient ID and intended procedure confirmed with present staff. Received instructions for my participation in the procedure from the performing physician. ewm 

## 2013-03-16 ENCOUNTER — Telehealth: Payer: Self-pay | Admitting: *Deleted

## 2013-03-16 NOTE — Telephone Encounter (Signed)
No answer. No identifier. Message left to call if questions or concerns. 

## 2013-03-22 ENCOUNTER — Encounter: Payer: Self-pay | Admitting: Internal Medicine

## 2013-03-22 DIAGNOSIS — Z860100 Personal history of colon polyps, unspecified: Secondary | ICD-10-CM | POA: Insufficient documentation

## 2013-03-22 HISTORY — DX: Personal history of colonic polyps: Z86.010

## 2013-03-22 HISTORY — DX: Personal history of colon polyps, unspecified: Z86.0100

## 2013-03-23 ENCOUNTER — Other Ambulatory Visit: Payer: Self-pay

## 2013-03-23 DIAGNOSIS — K635 Polyp of colon: Secondary | ICD-10-CM

## 2013-03-23 NOTE — Progress Notes (Signed)
I spoke with the patient's husband she was unavailable. He is aware of the appt date and time for [facility] Dr. Maisie Fus 04/10/13 11:20

## 2013-03-27 ENCOUNTER — Ambulatory Visit (INDEPENDENT_AMBULATORY_CARE_PROVIDER_SITE_OTHER): Payer: Self-pay | Admitting: General Surgery

## 2013-04-10 ENCOUNTER — Encounter (INDEPENDENT_AMBULATORY_CARE_PROVIDER_SITE_OTHER): Payer: Self-pay | Admitting: General Surgery

## 2013-04-10 ENCOUNTER — Ambulatory Visit (INDEPENDENT_AMBULATORY_CARE_PROVIDER_SITE_OTHER): Payer: Medicare Other | Admitting: General Surgery

## 2013-04-10 VITALS — BP 148/80 | HR 64 | Temp 98.4°F | Resp 15 | Ht 64.0 in | Wt 137.8 lb

## 2013-04-10 DIAGNOSIS — D129 Benign neoplasm of anus and anal canal: Secondary | ICD-10-CM

## 2013-04-10 DIAGNOSIS — D128 Benign neoplasm of rectum: Secondary | ICD-10-CM

## 2013-04-10 NOTE — Patient Instructions (Addendum)
Fiber Chart  You should 25-30g of fiber per day and drinking 8 glasses of water to help your bowels move regularly.  In the chart below you can look up how much fiber you are getting in an average day.  If you are not getting enough fiber, you should add a fiber supplement to your diet.  Examples of this include Metamucil, FiberCon and Citrucel.  These can be purchased at your local grocery store or pharmacy.      [email or web address]-guerrero.[email or web address]   [place]. Irregular bowel habits such as constipation can lead to many problems over time.  Having one soft bowel movement a day is the most important way to prevent further problems.  The anorectal canal is designed to handle stretching and feces to safely manage our ability to get rid of solid waste (feces, poop, stool) out of our body.  BUT, hard constipated stools can act like ripping concrete bricks causing inflamed hemorrhoids, anal fissures, abdominal pain and bloating.     The goal: ONE SOFT BOWEL MOVEMENT A DAY!  To have soft, regular bowel movements:    Drink at least 8 tall glasses of water a day.     Take plenty of fiber.  Fiber is the undigested part of plant food that passes into the colon, acting s "natures broom" to encourage bowel motility and movement.  Fiber can absorb and hold large amounts of water. This results in a larger, bulkier stool, which is soft and easier to pass. Work gradually over several weeks up to 6 servings a day of fiber (25g a day even more if needed) in the form of: o Vegetables -- Root (potatoes, carrots, turnips), leafy green (lettuce, salad greens, celery, spinach), or cooked high residue (cabbage, broccoli, etc) o Fruit -- Fresh (unpeeled skin & pulp), Dried (prunes, apricots, cherries, etc ),  or stewed ( applesauce)  o Whole grain breads, pasta, etc (whole wheat)  o Bran cereals    Bulking Agents -- This type of water-retaining fiber generally is  easily obtained each day by one of the following:  o Psyllium bran -- The psyllium plant is remarkable because its ground seeds can retain so much water. This product is available as Metamucil, Konsyl, Effersyllium, Per Diem Fiber, or the less expensive generic preparation in drug and health food stores. Although labeled a laxative, it really is not a laxative.  o Methylcellulose -- This is another fiber derived from wood which also retains water. It is available as Citrucel. o Polyethylene Glycol - and "artificial" fiber commonly called Miralax or Glycolax.  It is helpful for people with gassy or bloated feelings with regular fiber o Flax Seed - a less gassy fiber than psyllium   No reading or other relaxing activity while on the toilet. If bowel movements take longer than 5 minutes, you are too constipated   AVOID CONSTIPATION.  High fiber and water intake usually takes care of this.  Sometimes a laxative is needed to stimulate more frequent bowel movements, but    Laxatives are not a good long-term solution as it can wear the colon out. o Osmotics (Milk of Magnesia, Fleets phosphosoda, Magnesium citrate, MiraLax, GoLytely) are safer than  o Stimulants (Senokot, Castor Oil, Dulcolax, Ex Lax)    o Do not take laxatives for more than 7days in a row.    IF SEVERELY CONSTIPATED, try a Bowel Retraining Program: o Do not use laxatives.  o Eat a diet high in roughage, such as  bran cereals and leafy vegetables.  o Drink six (6) ounces of prune or apricot juice each morning.  o Eat two (2) large servings of stewed fruit each day.  o Take one (1) heaping tablespoon of a psyllium-based bulking agent twice a day. Use sugar-free sweetener when possible to avoid excessive calories.  o Eat a normal breakfast.  o Set aside 15 minutes after breakfast to sit on the toilet, but do not strain to have a bowel movement.  o If you do not have a bowel movement by the third day, use an enema and repeat the above steps.      [facility]  FULL RECTAL PREP INSTRUCTIONS (for Flexible Sigmoidoscopy / Rectal Surgery)    EVENING PRIOR TO SURGERY:   5:00pm:  Clear Liquid supper (jello-no fruit--, clear soups, tea, coffee)   No milk products.   7:00pm:  Take 2 oz (4 tablespoons) Milk of Magnesia.   DAY OF PROCEDURE:   2 hours before leaving house:  Take Fleet enema.  (These small enemas may be purchased at your local drug store, and may be the "drug store brand".  Do NOT purchase Mineral Oil enemas.   1 hour before leaving the house: Take another Fleet enema  --Try to retain each enema for 5-10 minutes before expelling it.  This should clean your lower colon sufficiently, and the procedure should not need to be repeated.    PATIENTS HAVING SURGERY:  Do not eat or drink anything after midnight the night before your surgery.  PATIENTS HAVING OFFICE PROCEDURE:  Do not eat solid food after midnight, but you MAY have liquids.     If you have questions, please call our office and speak to a member of the clinic staff:  [phone].

## 2013-04-10 NOTE — Progress Notes (Signed)
Chief Complaint  Patient presents with  . New Evaluation    eval colon polyp    HISTORY: Nancy Cuevas is a 67 y.o. female who presents to the office with rectal mass.  Other symptoms include occasional bleeding.  This had been occurring intermittently.  Her bowel habits are irregular and her bowel movements are sometimes hard.  Her fiber intake is good.  Her last colonoscopy was in [place] and showed a carpeting distal rectal polyp.  Biopsies showed tubular adenoma.   Past Medical History  Diagnosis Date  . POSTMENOPAUSAL STATUS 02/07/2008  . OSTEOPENIA 03/24/2008  . HYPOTHYROIDISM 07/18/2007  . HYPERLIPIDEMIA 10/27/2010  . Bunion   . Contact dermatitis and other eczema, due to unspecified cause   . Nonspecific (abnormal) findings on radiological and other examination of other intrathoracic organs   . Contact dermatitis and other eczema, due to unspecified cause   . Female stress incontinence   . Hyperpotassemia   . Disorder of bone and cartilage, unspecified   . Palpitations   . Symptomatic menopausal or female climacteric states   . 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 Personal history of colonic and rectal adenomas 03/22/2013      Past Surgical History  Procedure Laterality Date  . Left wrist hardware removal 10/12  10/12  . Retinal detachment surgery  2009    left  . Fracture surgery  june 2006    left wrist  . Cataract extraction  2011  . Carpal tunnel release  11/15/2011    Procedure: CARPAL TUNNEL RELEASE;  Surgeon: Tami Ribas, MD;  Location: [facility];  Service: Orthopedics;  Laterality: Left;  . Hand surgery  oct 2012 and jan 2013    Dr Dean--[facility]  . Natural birth  [birth year], 3  . Abdominal hysterectomy  1988    prolapse uterus  . Colonoscopy  1610,9604    normal,        Current Outpatient Prescriptions  Medication Sig Dispense Refill  . aspirin 81 MG tablet Take 81 mg by mouth every evening.        . Calcium Carbonate-Vitamin  D (CALCIUM 600 + D PO) Take 1 tablet by mouth daily.        . Flaxseed, Linseed, (FLAX SEED OIL) 1000 MG CAPS Take 1 capsule by mouth daily.        . IRON, FERROUS GLUCONATE, PO Take 1 capsule by mouth daily.      Marland Kitchen levothyroxine (SYNTHROID, LEVOTHROID) 50 MCG tablet take 1 tablet by mouth once daily  30 tablet  5  . Multiple Vitamin (MULTIVITAMIN) tablet Take 1 tablet by mouth daily.        . Omega-3 Fatty Acids (FISH OIL) 1000 MG CAPS Take 1 capsule by mouth daily.        . pravastatin (PRAVACHOL) 20 MG tablet take 1 tablet by mouth once daily for cholesterol  30 tablet  5   No current facility-administered medications for this visit.      Allergies  Allergen Reactions  . Sulfonamide Derivatives     REACTION: RASH      Family History  Problem Relation Age of Onset  . Breast cancer      1st degree relative <50  . Osteoporosis      1st degree relative  . Diabetes Mother     1st degree relative  . Hypertension Mother   . Stroke Mother   . Colon cancer Neg Hx   .  Heart disease Father   . Cancer Maternal Aunt   No colon cacer   History   Social History  . Marital Status: Married    Spouse Name: N/A    Number of Children: N/A  . Years of Education: N/A   Social History Main Topics  . Smoking status: Never Smoker   . Smokeless tobacco: Never Used  . Alcohol Use: 1.8 oz/week    3 Glasses of wine per week  . Drug Use: No  . Sexually Active: None   Other Topics Concern  . None   Social History Narrative   Exercise-- not regular       REVIEW OF SYSTEMS - PERTINENT POSITIVES ONLY: Review of Systems - General ROS: negative for - chills, fever or weight loss Hematological and Lymphatic ROS: negative for - bleeding problems, blood clots or bruising Respiratory ROS: no cough, shortness of breath, or wheezing Cardiovascular ROS: no chest pain or dyspnea on exertion Gastrointestinal ROS: no abdominal pain, change in bowel habits, or black or bloody stools Genito-Urinary  ROS: no dysuria, trouble voiding, or hematuria  EXAM: Filed Vitals:   04/10/13 1107  BP: 148/80  Pulse: 64  Temp: 98.4 F (36.9 C)  Resp: 15    General appearance: alert and cooperative Resp: clear to auscultation bilaterally Cardio: regular rate and rhythm GI: soft, non-tender; bowel sounds normal; no masses,  no organomegaly   Procedure: Anoscopy Surgeon: Maisie Fus Diagnosis: rectal polyp  Assistant: Neysa Bonito After the risks and benefits were explained, verbal consent was obtained for above procedure  Anesthesia: none Findings: rectal polyp in left anterior position, extending at least 5cm proximally into the rectum, ~1/3 of mucosa involved    ASSESSMENT AND PLAN: Nancy Cuevas is a 67 y.o. F with a benign distal rectal polyp found on screening colonoscopy.  She appears to be asymptomatic.  She does have constipation.  I would like her to get this better controlled prior to surgery.  I have recommended that she start a fiber regimen and possibly add colace and miralax if needed.  She has a trip planned in Aug out of the country, so we will plan on doing this once she returns in Sept.  I would like to use the TEM scope to make sure we get good proximal margins.  The risks and benefits were explained.  These mainly include bleeding, pain, stenosis and recurrence.  She has agreed to proceed with surgery.      Vanita Panda, MD Colon and Rectal Surgery / General Surgery [facility]      Visit Diagnoses: 1. Adenomatous rectal polyp     Primary Care Physician: Loreen Freud, DO

## 2013-05-11 ENCOUNTER — Ambulatory Visit (INDEPENDENT_AMBULATORY_CARE_PROVIDER_SITE_OTHER): Payer: Medicare Other | Admitting: Family Medicine

## 2013-05-11 ENCOUNTER — Encounter: Payer: Self-pay | Admitting: Family Medicine

## 2013-05-11 VITALS — BP 118/60 | HR 66 | Temp 98.6°F | Wt 137.4 lb

## 2013-05-11 DIAGNOSIS — N390 Urinary tract infection, site not specified: Secondary | ICD-10-CM

## 2013-05-11 LAB — POCT URINALYSIS DIPSTICK
Protein, UA: NEGATIVE
Spec Grav, UA: <=1.005
Urobilinogen, UA: 0.2

## 2013-05-11 MED ORDER — CEPHALEXIN 500 MG PO CAPS: 500.0000 mg | ORAL_CAPSULE | Freq: Two times a day (BID) | ORAL | Status: AC

## 2013-05-11 NOTE — Patient Instructions (Addendum)
This appears to be a UTI Drink plenty of fluids Start the Keflex tonight w/ dinner- twice daily x5 days Call with any questions or concerns Hang in there! Happy Early [place] Ouch!!!

## 2013-05-11 NOTE — Assessment & Plan Note (Signed)
New to provider.  Pt's sxs and PE consistent w/ infxn.  Start abx.  Reviewed supportive care and red flags that should prompt return.  Pt expressed understanding and is in agreement w/ plan.  

## 2013-05-11 NOTE — Progress Notes (Signed)
  Subjective:    Patient ID: Nancy Cuevas, female    DOB: [birth year], 68 y.o.   MRN: [id number]  HPI UTI- pt reports she developed abnormal sensation yesterday.  + cloudy urine.  Feeling of incomplete emptying, increased frequency.  No fevers, back pain.  Mild suprapubic pressure.  Hx of similar but infrequently.   Review of Systems For ROS see HPI     Objective:   Physical Exam  Vitals reviewed. Constitutional: She appears well-developed and well-nourished. No distress.  Abdominal: Soft. She exhibits no distension. There is no tenderness (no suprapubic or CVA tenderness).          Assessment & Plan:

## 2013-06-12 ENCOUNTER — Encounter (HOSPITAL_COMMUNITY): Payer: Self-pay | Admitting: Pharmacy Technician

## 2013-06-13 ENCOUNTER — Telehealth: Payer: Self-pay | Admitting: Cardiology

## 2013-06-13 NOTE — Telephone Encounter (Signed)
The message about pravastatin was done in error and is not for the patient.

## 2013-06-13 NOTE — Telephone Encounter (Signed)
New problem   Pt is having heart fluttering at night. Please call pt she would like to come in asap.

## 2013-06-13 NOTE — Telephone Encounter (Signed)
Spoke with patient. Pt states pravastatin is no longer on HT preferred list of medications. Dr Shirlee Latch recommended she substitute atorvastatin (1 ) or simvastatin(2). Pt is going to check prices at other pharmacies and call me back to let me what her decision is about alternate medication.

## 2013-06-13 NOTE — Telephone Encounter (Signed)
F/up  Pt returning the call//

## 2013-06-13 NOTE — Telephone Encounter (Signed)
Pt states for the past 2 nights she has had palpitations during the night. It feels like her heart flip flops.  This has not bothered her any other time. She has upcoming surgery and would like to check this out before surgery. I have given her an appt 06/14/13 with Lilian Coma, c

## 2013-06-13 NOTE — Telephone Encounter (Signed)
LMTCB

## 2013-06-14 ENCOUNTER — Ambulatory Visit (INDEPENDENT_AMBULATORY_CARE_PROVIDER_SITE_OTHER): Payer: Medicare Other | Admitting: Physician Assistant

## 2013-06-14 ENCOUNTER — Encounter: Payer: Self-pay | Admitting: Physician Assistant

## 2013-06-14 ENCOUNTER — Encounter: Payer: Self-pay | Admitting: *Deleted

## 2013-06-14 ENCOUNTER — Encounter (INDEPENDENT_AMBULATORY_CARE_PROVIDER_SITE_OTHER): Payer: Medicare Other

## 2013-06-14 VITALS — BP 132/68 | HR 71 | Ht 64.0 in | Wt 141.0 lb

## 2013-06-14 DIAGNOSIS — I498 Other specified cardiac arrhythmias: Secondary | ICD-10-CM

## 2013-06-14 DIAGNOSIS — E039 Hypothyroidism, unspecified: Secondary | ICD-10-CM

## 2013-06-14 DIAGNOSIS — R002 Palpitations: Secondary | ICD-10-CM

## 2013-06-14 DIAGNOSIS — I471 Supraventricular tachycardia: Secondary | ICD-10-CM

## 2013-06-14 DIAGNOSIS — E785 Hyperlipidemia, unspecified: Secondary | ICD-10-CM

## 2013-06-14 NOTE — Progress Notes (Signed)
[address] [place], [place]  [address] Phone: [phone] Fax:  [phone]  Date:  06/14/2013   ID:  Nancy Cuevas, DOB 07-14-1946, MRN [id number]  PCP:  Loreen Freud, DO  Cardiologist:  Dr. Marca Ancona     History of Present Illness: Nancy Cuevas is a 67 y.o. female who returns for the evaluation of palpitations.  She has a history of hypothyroidism, HL and palpitations. Prior Holter monitor has demonstrated atrial tachycardia versus MAT. Echo 1/12: Normal LV function, mild diastolic dysfunction. Last seen by Dr. Shirlee Latch in 07/2012.  She recently returned from a 10 day trip to [place]. She does admit to drinking more wine than she normally does. Since returning, she has noted episodes of palpitations at night after lying down in bed. Palpitations are fast and irregular. It is concerning for her. She denies any associated weakness, dyspnea, chest pain. She denies syncope, orthopnea, PND or significant pedal edema.  Labs (12/11): LDL 177, HDL 100, Lp(a) < 15  Labs (1/12): K 5.2, creatinine 0.7  Labs (6/13): HDL 96, LDL 113, K 4.7, creatinine 0.6, TSH normal.  Labs (4/14):  K 3.6, creatinine 0.6, ALT 22, HDL 85.4, LDL 98, Hgb 11.2, TSH 1.54   Wt Readings from Last 3 Encounters:  06/14/13 141 lb (63.957 kg)  05/11/13 137 lb 6.4 oz (62.324 kg)  04/10/13 137 lb 12.8 oz (62.506 kg)     Past Medical History  Diagnosis Date  . POSTMENOPAUSAL STATUS 02/07/2008  . OSTEOPENIA 03/24/2008  . HYPOTHYROIDISM 07/18/2007  . HYPERLIPIDEMIA 10/27/2010  . Bunion   . Contact dermatitis and other eczema, due to unspecified cause   . Nonspecific (abnormal) findings on radiological and other examination of other intrathoracic organs   . Contact dermatitis and other eczema, due to unspecified cause   . Female stress incontinence   . Hyperpotassemia   . Disorder of bone and cartilage, unspecified   . Palpitations     Echo (1/12) EF 55-60%, mild diastolic dysfunction, atrial  septal aneurysm. Holter (1/12): short runs of atrial tachycardia versus MAT.   Marland Kitchen Symptomatic menopausal or female climacteric states   . 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 Personal history of colonic and rectal adenomas 03/22/2013    Current Outpatient Prescriptions  Medication Sig Dispense Refill  . aspirin 81 MG tablet Take 81 mg by mouth every evening.        . Calcium Carbonate-Vitamin D (CALCIUM 600 + D PO) Take 1 tablet by mouth daily.        . Flaxseed, Linseed, (FLAX SEED OIL) 1000 MG CAPS Take 1 capsule by mouth daily. TAKE ONE TAB TWICE A DAY      . IRON, FERROUS GLUCONATE, PO Take 1 capsule by mouth daily.      Marland Kitchen levothyroxine (SYNTHROID, LEVOTHROID) 50 MCG tablet Take 50 mcg by mouth daily before breakfast.      . Multiple Vitamin (MULTIVITAMIN) tablet Take 1 tablet by mouth daily.        . Omega-3 Fatty Acids (FISH OIL) 1000 MG CAPS Take 1 capsule by mouth daily. TAKE ONE TAB TWICE A DAY      . pravastatin (PRAVACHOL) 20 MG tablet Take 20 mg by mouth every evening.       No current facility-administered medications for this visit.    Allergies:    Allergies  Allergen Reactions  . Sulfonamide Derivatives     REACTION: RASH    Social History:  The patient  reports that she has never smoked. She has never used smokeless tobacco. She reports that she drinks about 1.8 ounces of alcohol per week. She reports that she does not use illicit drugs.   ROS:  Please see the history of present illness.      All other systems reviewed and negative.   PHYSICAL EXAM: VS:  BP 132/68  Pulse 71  Ht 5\' 4"  (1.626 m)  Wt 141 lb (63.957 kg)  BMI 24.19 kg/m2 Well nourished, well developed, in no acute distress HEENT: normal Neck: no JVD Endocrine: No thyromegaly Cardiac:  normal S1, S2; RRR; no murmur Lungs:  clear to auscultation bilaterally, no wheezing, rhonchi or rales Abd: soft, nontender, no hepatomegaly Ext: no edema Skin: warm and dry Neuro:  CNs 2-12  intact, no focal abnormalities noted  EKG:  NSR, HR 71, normal axis, no acute changes     ASSESSMENT AND PLAN:  1. Palpitations:  Etiology of her symptoms are not clear. Question if she's had a recurrence of atrial tachycardia. Question if this may have been exacerbated by increased intake of alcohol on her recent trip or possibly caffeine. Recent TSH was normal. Arrange 48-hour Holter. 2. Atrial Tachycardia: Arrange Holter monitor. If she has recurrence, consider when necessary beta blockers. 3. Hyperlipidemia: Continue statin. 4. Hypothyroidism: Recent TSH normal. 5. Disposition: Followup with Dr. Shirlee Latch in 3 months.  Signed, Tereso Newcomer, PA-C  06/14/2013 3:20 PM

## 2013-06-14 NOTE — Progress Notes (Signed)
Patient ID: Nancy Cuevas, female   DOB: 11/10/1945, 67 y.o.   MRN: [id number] E-Cardio 48 Hour holter monitor applied to patient.

## 2013-06-14 NOTE — Patient Instructions (Addendum)
Your physician recommends that you continue on your current medications as directed. Please refer to the Current Medication list given to you today.  48hr Holter to be placed today at 4pm  Your physician has recommended that you wear a holter monitor. Holter monitors are medical devices that record the heart's electrical activity. Doctors most often use these monitors to diagnose arrhythmias. Arrhythmias are problems with the speed or rhythm of the heartbeat. The monitor is a small, portable device. You can wear one while you do your normal daily activities. This is usually used to diagnose what is causing palpitations/syncope (passing out).  Your physician recommends that you schedule a follow-up appointment in: 3 months with Dr.Mclean

## 2013-06-15 ENCOUNTER — Encounter (HOSPITAL_COMMUNITY): Payer: Self-pay

## 2013-06-15 ENCOUNTER — Encounter (HOSPITAL_COMMUNITY)
Admission: RE | Admit: 2013-06-15 | Discharge: 2013-06-15 | Disposition: A | Discharge status: Home / Self Care | Payer: Medicare Other | Source: Ambulatory Visit | Attending: General Surgery | Admitting: General Surgery

## 2013-06-15 DIAGNOSIS — Z01812 Encounter for preprocedural laboratory examination: Secondary | ICD-10-CM | POA: Insufficient documentation

## 2013-06-15 LAB — BASIC METABOLIC PANEL
Chloride: 101 mEq/L (ref 96–112)
GFR calc Af Amer: >90 mL/min (ref >90)
Potassium: 4.8 mEq/L (ref 3.5–5.1)
Sodium: 138 mEq/L (ref 135–145)

## 2013-06-15 LAB — CBC
HCT: 36.2 % (ref 36.0–46.0)
Platelets: 267 10*3/uL (ref 150–400)
RDW: 13.5 % (ref 11.5–15.5)
WBC: 6.6 10*3/uL (ref 4.0–10.5)

## 2013-06-15 NOTE — Patient Instructions (Signed)
20      Your procedure is scheduled on:  FRIDAY 06/22/2013  Report to [facility] at 0530  AM.  Call this number if you have problems the night before or morning of surgery:  [phone]   Remember:             IF YOU USE CPAP,BRING MASK AND TUBING AM OF SURGERY!   Do not eat food or drink liquids AFTER MIDNIGHT!  Take these medicines the morning of surgery with A SIP OF WATER: Levothyroxine   Do not bring valuables to the hospital. [facility] IS NOT RESPONSIBLE FOR ANY BELONGINGS OR VALUABLES BROUGHT TO HOSPITAL.  Marland Kitchen  Leave suitcase in the car. After surgery it may be brought to your room.  For patients admitted to the hospital, checkout time is 11:00 AM the day of              Discharge.    DO NOT WEAR JEWELRY , MAKE-UP, LOTIONS,POWDERS,PERFUMES!             WOMEN -DO NOT SHAVE LEGS OR UNDERARMS 12 HRS. BEFORE SURGERY!               MEN MAY SHAVE AS USUAL!             CONTACTS,DENTURES OR BRIDGEWORK, FALSE EYELASHES MAY NOT BE WORN INTO SURGERY!                                           Patients discharged the day of surgery will not be allowed to drive home. If going home the same day of surgery, must have someone stay with you first 24 hrs.at home and arrange for someone to drive you home from the HOSPITAL.                          YOUR DRIVER ZO:[id number]   Special Instructions:             Please read over the following fact sheets that you were given:             1. [facility] PREPARING FOR SURGERY SHEET              2.INCENTIVE SPIROMETRY                                        [place].Katlyne Nishida,RN,BSN     [phone]                FAILURE TO FOLLOW THESE INSTRUCTIONS MAY RESULT IN  CANCELLATION OF YOUR SURGERY!               Patient Signature:___________________________

## 2013-06-22 ENCOUNTER — Telehealth: Payer: Self-pay | Admitting: *Deleted

## 2013-06-22 ENCOUNTER — Ambulatory Visit (HOSPITAL_COMMUNITY)
Admission: RE | Admit: 2013-06-22 | Discharge: 2013-06-22 | DRG: 334 | Disposition: A | Discharge status: Home / Self Care | Payer: Medicare Other | Source: Ambulatory Visit | Attending: General Surgery | Admitting: General Surgery

## 2013-06-22 ENCOUNTER — Encounter (HOSPITAL_COMMUNITY)
Admission: RE | Disposition: A | Discharge status: Home / Self Care | Payer: Self-pay | Source: Ambulatory Visit | Attending: General Surgery

## 2013-06-22 ENCOUNTER — Inpatient Hospital Stay (HOSPITAL_COMMUNITY): Payer: Medicare Other | Admitting: Registered Nurse

## 2013-06-22 ENCOUNTER — Encounter (HOSPITAL_COMMUNITY): Payer: Self-pay | Admitting: *Deleted

## 2013-06-22 ENCOUNTER — Encounter (HOSPITAL_COMMUNITY): Payer: Self-pay | Admitting: Registered Nurse

## 2013-06-22 DIAGNOSIS — D128 Benign neoplasm of rectum: Secondary | ICD-10-CM

## 2013-06-22 DIAGNOSIS — Z7982 Long term (current) use of aspirin: Secondary | ICD-10-CM | POA: Insufficient documentation

## 2013-06-22 DIAGNOSIS — E785 Hyperlipidemia, unspecified: Secondary | ICD-10-CM | POA: Insufficient documentation

## 2013-06-22 DIAGNOSIS — D129 Benign neoplasm of anus and anal canal: Secondary | ICD-10-CM

## 2013-06-22 DIAGNOSIS — K5900 Constipation, unspecified: Secondary | ICD-10-CM | POA: Insufficient documentation

## 2013-06-22 DIAGNOSIS — M899 Disorder of bone, unspecified: Secondary | ICD-10-CM | POA: Insufficient documentation

## 2013-06-22 DIAGNOSIS — Z79899 Other long term (current) drug therapy: Secondary | ICD-10-CM | POA: Insufficient documentation

## 2013-06-22 DIAGNOSIS — E039 Hypothyroidism, unspecified: Secondary | ICD-10-CM | POA: Insufficient documentation

## 2013-06-22 HISTORY — PX: PARTIAL PROCTECTOMY BY TEM: SHX6011

## 2013-06-22 SURGERY — PARTIAL PROCTECTOMY BY TEM
Anesthesia: General | Site: Rectum | Wound class: Dirty or Infected

## 2013-06-22 MED ORDER — OXYCODONE HCL 5 MG PO TABS: 5.0000 mg | ORAL_TABLET | ORAL | Status: DC | PRN

## 2013-06-22 MED ORDER — MEPERIDINE HCL 50 MG/ML IJ SOLN: 6.2500 mg | INTRAMUSCULAR | Status: DC | PRN

## 2013-06-22 MED ORDER — EPHEDRINE SULFATE 50 MG/ML IJ SOLN
INTRAMUSCULAR | Status: DC | PRN
  Administered 2013-06-22: 10 mg via INTRAVENOUS

## 2013-06-22 MED ORDER — DEXAMETHASONE SODIUM PHOSPHATE 10 MG/ML IJ SOLN
INTRAMUSCULAR | Status: DC | PRN
  Administered 2013-06-22: 10 mg via INTRAVENOUS

## 2013-06-22 MED ORDER — OXYCODONE HCL 5 MG PO TABS: 5.0000 mg | ORAL_TABLET | Freq: Once | ORAL | Status: DC | PRN

## 2013-06-22 MED ORDER — SODIUM CHLORIDE 0.9 % IV SOLN
1.0000 g | INTRAVENOUS | Status: DC | PRN
  Administered 2013-06-22: 1 g via INTRAVENOUS

## 2013-06-22 MED ORDER — ONDANSETRON HCL 4 MG/2ML IJ SOLN: 4.0000 mg | Freq: Four times a day (QID) | INTRAMUSCULAR | Status: DC | PRN

## 2013-06-22 MED ORDER — ACETAMINOPHEN 325 MG PO TABS: 650.0000 mg | ORAL_TABLET | ORAL | Status: DC | PRN

## 2013-06-22 MED ORDER — PHENYLEPHRINE HCL 10 MG/ML IJ SOLN
INTRAMUSCULAR | Status: DC | PRN
  Administered 2013-06-22: 120 ug via INTRAVENOUS
  Administered 2013-06-22 (×6): 80 ug via INTRAVENOUS

## 2013-06-22 MED ORDER — SUCCINYLCHOLINE CHLORIDE 20 MG/ML IJ SOLN
INTRAMUSCULAR | Status: DC | PRN
  Administered 2013-06-22: 100 mg via INTRAVENOUS

## 2013-06-22 MED ORDER — ERTAPENEM SODIUM 1 G IJ SOLR: 1.0000 g | INTRAMUSCULAR | Status: DC

## 2013-06-22 MED ORDER — ROCURONIUM BROMIDE 100 MG/10ML IV SOLN
INTRAVENOUS | Status: DC | PRN
  Administered 2013-06-22: 35 mg via INTRAVENOUS
  Administered 2013-06-22: 5 mg via INTRAVENOUS

## 2013-06-22 MED ORDER — BUPIVACAINE-EPINEPHRINE PF 0.25-1:200000 % IJ SOLN
INTRAMUSCULAR | Status: AC
  Filled 2013-06-22: qty 30

## 2013-06-22 MED ORDER — ACETAMINOPHEN 650 MG RE SUPP
650.0000 mg | RECTAL | Status: DC | PRN
  Filled 2013-06-22: qty 1

## 2013-06-22 MED ORDER — MIDAZOLAM HCL 5 MG/5ML IJ SOLN
INTRAMUSCULAR | Status: DC | PRN
  Administered 2013-06-22: 2 mg via INTRAVENOUS

## 2013-06-22 MED ORDER — POLYETHYLENE GLYCOL 3350 17 GM/SCOOP PO POWD: 8.5000 g | Freq: Two times a day (BID) | ORAL | Status: DC | PRN

## 2013-06-22 MED ORDER — OXYCODONE HCL 5 MG/5ML PO SOLN
5.0000 mg | Freq: Once | ORAL | Status: DC | PRN
  Filled 2013-06-22: qty 5

## 2013-06-22 MED ORDER — SODIUM CHLORIDE 0.9 % IJ SOLN: 3.0000 mL | INTRAMUSCULAR | Status: DC | PRN

## 2013-06-22 MED ORDER — SODIUM CHLORIDE 0.9 % IJ SOLN: 3.0000 mL | Freq: Two times a day (BID) | INTRAMUSCULAR | Status: DC

## 2013-06-22 MED ORDER — NEOSTIGMINE METHYLSULFATE 1 MG/ML IJ SOLN
INTRAMUSCULAR | Status: DC | PRN
  Administered 2013-06-22: 3 mg via INTRAVENOUS

## 2013-06-22 MED ORDER — HYDROMORPHONE HCL PF 1 MG/ML IJ SOLN: 0.2500 mg | INTRAMUSCULAR | Status: DC | PRN

## 2013-06-22 MED ORDER — PSYLLIUM 30.9 % PO POWD: ORAL | Status: DC

## 2013-06-22 MED ORDER — LACTATED RINGERS IV SOLN
INTRAVENOUS | Status: DC | PRN
  Administered 2013-06-22 (×2): via INTRAVENOUS

## 2013-06-22 MED ORDER — GLYCOPYRROLATE 0.2 MG/ML IJ SOLN
INTRAMUSCULAR | Status: DC | PRN
  Administered 2013-06-22: .4 mg via INTRAVENOUS

## 2013-06-22 MED ORDER — SODIUM CHLORIDE 0.9 % IV SOLN
INTRAVENOUS | Status: AC
  Filled 2013-06-22: qty 1

## 2013-06-22 MED ORDER — SUFENTANIL CITRATE 50 MCG/ML IV SOLN
INTRAVENOUS | Status: DC | PRN
  Administered 2013-06-22: 5 ug via INTRAVENOUS
  Administered 2013-06-22: 10 ug via INTRAVENOUS
  Administered 2013-06-22: 5 ug via INTRAVENOUS

## 2013-06-22 MED ORDER — BUPIVACAINE LIPOSOME 1.3 % IJ SUSP
INTRAMUSCULAR | Status: DC | PRN
  Administered 2013-06-22: 20 mL

## 2013-06-22 MED ORDER — SODIUM CHLORIDE 0.9 % IV SOLN: 250.0000 mL | INTRAVENOUS | Status: DC | PRN

## 2013-06-22 MED ORDER — 0.9 % SODIUM CHLORIDE (POUR BTL) OPTIME
TOPICAL | Status: DC | PRN
  Administered 2013-06-22: 1000 mL

## 2013-06-22 MED ORDER — ONDANSETRON HCL 4 MG/2ML IJ SOLN
INTRAMUSCULAR | Status: DC | PRN
  Administered 2013-06-22: 4 mg via INTRAVENOUS

## 2013-06-22 MED ORDER — HYDROCODONE-ACETAMINOPHEN 5-325 MG PO TABS: 1.0000 | ORAL_TABLET | ORAL | Status: DC | PRN

## 2013-06-22 MED ORDER — LIDOCAINE HCL 4 % MT SOLN
OROMUCOSAL | Status: DC | PRN
  Administered 2013-06-22: 4 mL via TOPICAL

## 2013-06-22 MED ORDER — LIDOCAINE HCL (CARDIAC) 20 MG/ML IV SOLN
INTRAVENOUS | Status: DC | PRN
  Administered 2013-06-22: 60 mg via INTRAVENOUS

## 2013-06-22 MED ORDER — PROPOFOL 10 MG/ML IV BOLUS
INTRAVENOUS | Status: DC | PRN
  Administered 2013-06-22: 140 mg via INTRAVENOUS
  Administered 2013-06-22 (×2): 30 mg via INTRAVENOUS

## 2013-06-22 MED ORDER — HYDROMORPHONE HCL PF 1 MG/ML IJ SOLN
INTRAMUSCULAR | Status: DC | PRN
  Administered 2013-06-22: 0.5 mg via INTRAVENOUS

## 2013-06-22 MED ORDER — BUPIVACAINE LIPOSOME 1.3 % IJ SUSP
20.0000 mL | Freq: Once | INTRAMUSCULAR | Status: DC
  Filled 2013-06-22: qty 20

## 2013-06-22 MED ORDER — PROMETHAZINE HCL 25 MG/ML IJ SOLN
6.2500 mg | INTRAMUSCULAR | Status: DC | PRN
  Administered 2013-06-22: 6.25 mg via INTRAVENOUS
  Filled 2013-06-22: qty 1

## 2013-06-22 SURGICAL SUPPLY — 55 items
BRIEF STRETCH FOR OB PAD LRG (UNDERPADS AND DIAPERS) IMPLANT
CATH FOLEY 2WAY SLVR  5CC 12FR (CATHETERS) ×1
CATH FOLEY 2WAY SLVR 5CC 12FR (CATHETERS) ×2 IMPLANT
CLOTH BEACON ORANGE TIMEOUT ST (SAFETY) ×3 IMPLANT
CORD HIGH FREQUENCY UNIPOLAR (ELECTROSURGICAL) ×3 IMPLANT
DEVICE SUT QUICK LOAD TK 5 (STAPLE) IMPLANT
DEVICE SUT TI-KNOT TK 5X26 (MISCELLANEOUS) IMPLANT
DRAPE CAMERA CLOSED 9X96 (DRAPES) ×3 IMPLANT
DRAPE LAPAROSCOPIC ABDOMINAL (DRAPES) IMPLANT
DRAPE LAPAROTOMY T 102X78X121 (DRAPES) IMPLANT
DRAPE PED LAPAROTOMY (DRAPES) ×3 IMPLANT
DRAPE WARM FLUID 44X44 (DRAPE) ×3 IMPLANT
ELECT BLADE 6.5 EXT (BLADE) IMPLANT
ELECT REM PT RETURN 9FT ADLT (ELECTROSURGICAL) ×3
ELECTRODE REM PT RTRN 9FT ADLT (ELECTROSURGICAL) ×2 IMPLANT
FILTER SMOKE EVAC LAPAROSHD (FILTER) ×3 IMPLANT
GAUZE SPONGE 4X4 16PLY XRAY LF (GAUZE/BANDAGES/DRESSINGS) IMPLANT
GLOVE ECLIPSE 8.0 STRL XLNG CF (GLOVE) ×6 IMPLANT
GLOVE INDICATOR 8.0 STRL GRN (GLOVE) ×6 IMPLANT
GOWN STRL REIN XL XLG (GOWN DISPOSABLE) ×15 IMPLANT
KIT BASIN OR (CUSTOM PROCEDURE TRAY) ×3 IMPLANT
LEGGING LITHOTOMY PAIR STRL (DRAPES) ×3 IMPLANT
LUBRICANT JELLY K Y 4OZ (MISCELLANEOUS) ×3 IMPLANT
NEEDLE HYPO 22GX1.5 SAFETY (NEEDLE) ×3 IMPLANT
NS IRRIG 1000ML POUR BTL (IV SOLUTION) ×3 IMPLANT
PACK GENERAL/GYN (CUSTOM PROCEDURE TRAY) ×3 IMPLANT
PAD ABD 7.5X8 STRL (GAUZE/BANDAGES/DRESSINGS) ×3 IMPLANT
PENCIL BUTTON HOLSTER BLD 10FT (ELECTRODE) IMPLANT
RETRACTOR STAY HOOK 5MM (MISCELLANEOUS) IMPLANT
RETRACTOR WILSON SYSTEM (INSTRUMENTS) IMPLANT
SCALPEL HARMONIC ACE (MISCELLANEOUS) ×3 IMPLANT
SCISSORS LAP 5X35 DISP (ENDOMECHANICALS) ×3 IMPLANT
SET IRRIG TUBING LAPAROSCOPIC (IRRIGATION / IRRIGATOR) ×3 IMPLANT
SOLUTION ELECTROLUBE (MISCELLANEOUS) ×3 IMPLANT
SPONGE GAUZE 4X4 12PLY (GAUZE/BANDAGES/DRESSINGS) IMPLANT
STOPCOCK 4 WAY LG BORE MALE ST (IV SETS) ×3 IMPLANT
SUT PDS AB 2-0 CT2 27 (SUTURE) IMPLANT
SUT PDS AB 3-0 SH 27 (SUTURE) IMPLANT
SUT SILK 2 0 (SUTURE)
SUT SILK 2 0 SH CR/8 (SUTURE) IMPLANT
SUT SILK 2-0 18XBRD TIE 12 (SUTURE) IMPLANT
SUT SILK 3 0 SH 30 (SUTURE) IMPLANT
SUT SILK 3 0 SH CR/8 (SUTURE) IMPLANT
SUT VIC AB 2-0 CT2 27 (SUTURE) ×6 IMPLANT
SUT VIC AB 2-0 UR6 27 (SUTURE) IMPLANT
SUT VIC AB 3-0 SH 18 (SUTURE) IMPLANT
SUT VIC AB 3-0 SH 27 (SUTURE)
SUT VIC AB 3-0 SH 27XBRD (SUTURE) IMPLANT
SUT VIC AB 4-0 SH 18 (SUTURE) IMPLANT
SYR 20CC LL (SYRINGE) ×3 IMPLANT
TOWEL OR 17X26 10 PK STRL BLUE (TOWEL DISPOSABLE) ×3 IMPLANT
TOWEL OR NON WOVEN STRL DISP B (DISPOSABLE) ×3 IMPLANT
TRAY FOLEY CATH 14FRSI W/METER (CATHETERS) ×3 IMPLANT
TUBING INSUFFLATION 10FT LAP (TUBING) ×3 IMPLANT
YANKAUER SUCT BULB TIP 10FT TU (MISCELLANEOUS) IMPLANT

## 2013-06-22 NOTE — Telephone Encounter (Signed)
lmom go over monitor results. I apologized for call today, saw that pt had surgery this morning and that we will cb next week with the monitor results. I lmom get better soon!!

## 2013-06-22 NOTE — Op Note (Addendum)
06/22/2013  10:00 AM  PATIENT:  Osvaldo Angst  67 y.o. female  Patient Care Team: Lelon Perla, DO as PCP - General Iva Boop, MD as Consulting Physician (Gastroenterology)  PRE-OPERATIVE DIAGNOSIS:  Distal rectal polyp   POST-OPERATIVE DIAGNOSIS:  Distal rectal polyp  PROCEDURE:  PARTIAL PROCTECTOMY BY TEM  SURGEON:  Surgeon(s): Romie Levee, MD  ASSISTANT: Michaell Cowing   ANESTHESIA:   general  EBL: 50ml  Total I/O In: 1000 [I.V.:1000] Out: 300 [Urine:300]  Delay start of Pharmacological VTE agent (>24hrs) due to surgical blood loss or risk of bleeding:  no  DRAINS: none   SPECIMEN:  Source of Specimen:  distal rectal tumor  DISPOSITION OF SPECIMEN:  PATHOLOGY  COUNTS:  YES  PLAN OF CARE: Discharge to home after PACU  PATIENT DISPOSITION:  PACU - hemodynamically stable.  INDICATION: this is a 67 year old female who presented to my office with a distal rectal polyp.  Biopsies show low-grade dysplasia. This appears to span a large amount of her rectum and it was decided to perform a TEM for complete removal.  OR FINDINGS: large distal rectal polyp, benign-appearing  DESCRIPTION: The patient was identified in the preoperative holding area and taken to the OR where they were laid prone on the operating room table in split leg position. General anesthesia was smoothly induced.  The patient was then prepped and draped in the usual sterile fashion. A surgical timeout was performed indicating the correct patient, procedure, positioning and preoperative antibiotics. SCDs were noted to be in place and functioning prior to the operation.  I began by performing a digital rectal exam. The polyp was in the left anterior position. A Hill-Ferguson anoscope was placed into the anal canal. The rectal mucosa was very redundant. The distal margin was easily identified just above the anorectal verge. The proximal margin was difficult to visualize it was decided to place TEM scope into the  anal canal.  Anal canal was gently dilated and the scope was placed. The rectum was insufflated to approximately 15 mm mercury. The camera was inserted. The proximal margin was identified. I placed marking electrocautery sites approximately 1 cm from the margin circumferentially. I then used a light artery to connect these dots. I then used the harmonic scalpel and blunt dissection to dissect underneath the polyp until the entire polyp was removed. Hemostasis was achieved as I went. The polyp was then oriented on a corkboard. The left-sided margin appeared to be close therefore I resected a separate section of tissue to complete this margin. The right-sided margin and proximal and distal margins appeared good. We then while the specimen and down to pathology and explaining the orientation completely. I then removed the scope and placed a retractor into the anal canal. The 2 proximal margins were identified and a 2-0 Vicryl running suture was used to close both corners. The distal portion of the incision was closed using interrupted 2-0 Vicryl sutures. After this was completed the rectum was irrigated. Hemostasis was good. A rigid proctoscopy was performed to ensure rectal patency. The rectum was then desufflated and the procedure was complete.  Exparel was infused for a rectal block. A sterile dressing was placed. The patient was then awakened from anesthesia and sent to the postanesthesia care unit in stable condition. All counts were correct per operating room staff.

## 2013-06-22 NOTE — Anesthesia Postprocedure Evaluation (Signed)
Anesthesia Post Note  Patient: Nancy Cuevas  Procedure(s) Performed: Procedure(s): PARTIAL PROCTECTOMY BY TEM  Anesthesia type: General  Patient location: PACU  Post pain: Pain level controlled  Post assessment: Post-op Vital signs reviewed  Last Vitals: BP 123/58  Pulse 56  Temp(Src) 36.4 C (Oral)  Resp 9  SpO2 99%  Post vital signs: Reviewed  Level of consciousness: sedated  Complications: No apparent anesthesia complications

## 2013-06-22 NOTE — H&P (Signed)
Chief Complaint   Patient presents with   .  New Evaluation     eval colon polyp   HISTORY: Nancy Cuevas is a 67 y.o. female who presents to the office with rectal mass. Other symptoms include occasional bleeding. This had been occurring intermittently. Her bowel habits are irregular and her bowel movements are sometimes hard. Her fiber intake is good. Her last colonoscopy was in May and showed a carpeting distal rectal polyp. Biopsies showed tubular adenoma.  Past Medical History   Diagnosis  Date   .  POSTMENOPAUSAL STATUS  02/07/2008   .  OSTEOPENIA  03/24/2008   .  HYPOTHYROIDISM  07/18/2007   .  HYPERLIPIDEMIA  10/27/2010   .  Bunion    .  Contact dermatitis and other eczema, due to unspecified cause    .  Nonspecific (abnormal) findings on radiological and other examination of other intrathoracic organs    .  Contact dermatitis and other eczema, due to unspecified cause    .  Female stress incontinence    .  Hyperpotassemia    .  Disorder of bone and cartilage, unspecified    .  Palpitations    .  Symptomatic menopausal or female climacteric states    .  Benign neoplasm of rectum and anal canal  03/15/2013   .  Personal history of colonic and rectal adenomas  03/22/2013    Past Surgical History   Procedure  Laterality  Date   .  Left wrist hardware removal 10/12   10/12   .  Retinal detachment surgery   2009     left   .  Fracture surgery   june 2006     left wrist   .  Cataract extraction   2011   .  Carpal tunnel release   11/15/2011     Procedure: CARPAL TUNNEL RELEASE; Surgeon: Tami Ribas, MD; Location: [facility]; Service: Orthopedics; Laterality: Left;   .  Hand surgery   oct 2012 and jan 2013     Dr Dean--[facility]   .  Natural birth   43, 23   .  Abdominal hysterectomy   1988     prolapse uterus   .  Colonoscopy   2956,2130     normal,    Current Outpatient Prescriptions   Medication  Sig  Dispense  Refill   .  aspirin 81 MG tablet  Take  81 mg by mouth every evening.     .  Calcium Carbonate-Vitamin D (CALCIUM 600 + D PO)  Take 1 tablet by mouth daily.     .  Flaxseed, Linseed, (FLAX SEED OIL) 1000 MG CAPS  Take 1 capsule by mouth daily.     .  IRON, FERROUS GLUCONATE, PO  Take 1 capsule by mouth daily.     Marland Kitchen  levothyroxine (SYNTHROID, LEVOTHROID) 50 MCG tablet  take 1 tablet by mouth once daily  30 tablet  5   .  Multiple Vitamin (MULTIVITAMIN) tablet  Take 1 tablet by mouth daily.     .  Omega-3 Fatty Acids (FISH OIL) 1000 MG CAPS  Take 1 capsule by mouth daily.     .  pravastatin (PRAVACHOL) 20 MG tablet  take 1 tablet by mouth once daily for cholesterol  30 tablet  5    No current facility-administered medications for this visit.    Allergies   Allergen  Reactions   .  Sulfonamide Derivatives  REACTION: RASH    Family History   Problem  Relation  Age of Onset   .  Breast cancer       1st degree relative <50   .  Osteoporosis       1st degree relative   .  Diabetes  Mother      1st degree relative   .  Hypertension  Mother    .  Stroke  Mother    .  Colon cancer  Neg Hx    .  Heart disease  Father    .  Cancer  Maternal Aunt    No colon cacer  History    Social History   .  Marital Status:  Married     Spouse Name:  N/A     Number of Children:  N/A   .  Years of Education:  N/A    Social History Main Topics   .  Smoking status:  Never Smoker   .  Smokeless tobacco:  Never Used   .  Alcohol Use:  1.8 oz/week     3 Glasses of wine per week   .  Drug Use:  No   .  Sexually Active:  None    Other Topics  Concern   .  None    Social History Narrative    Exercise-- not regular   REVIEW OF SYSTEMS - PERTINENT POSITIVES ONLY:  Review of Systems - General ROS: negative for - chills, fever or weight loss  Hematological and Lymphatic ROS: negative for - bleeding problems, blood clots or bruising  Respiratory ROS: no cough, shortness of breath, or wheezing  Cardiovascular ROS: no chest pain or  dyspnea on exertion  Gastrointestinal ROS: no abdominal pain, change in bowel habits, or black or bloody stools  Genito-Urinary ROS: no dysuria, trouble voiding, or hematuria  EXAM:  Filed Vitals:   06/22/13 0525  BP: 131/62  Pulse: 61  Temp: 97.5 F (36.4 C)  Resp: 18   General appearance: alert and cooperative  Resp: clear to auscultation bilaterally  Cardio: regular rate and rhythm  GI: soft, non-tender; bowel sounds normal; no masses, no organomegaly  Previous Exam Findings: rectal polyp in left anterior position, extending at least 5cm proximally into the rectum, ~1/3 of mucosa involved   ASSESSMENT AND PLAN:  Nancy Cuevas is a 67 y.o. F with a benign distal rectal polyp found on screening colonoscopy. She appears to be asymptomatic. She does have constipation. I would like her to get this better controlled prior to surgery. I have recommended that she start a fiber regimen and possibly add colace and miralax if needed. She has a trip planned in Aug out of the country, so we will plan on doing this once she returns in Sept. I would like to use the TEM scope to make sure we get good proximal margins. The risks and benefits were explained. These mainly include bleeding, pain, stenosis and recurrence. She has agreed to proceed with surgery.    Vanita Panda, MD  Colon and Rectal Surgery / General Surgery  [facility]  Visit Diagnoses:  1.  Adenomatous rectal polyp   Primary Care Physician:  Loreen Freud, DO

## 2013-06-22 NOTE — Anesthesia Preprocedure Evaluation (Addendum)
Anesthesia Evaluation  Patient identified by MRN, date of birth, ID band Patient awake    Reviewed: Allergy & Precautions, H&P , NPO status   History of Anesthesia Complications Negative for: history of anesthetic complications  Airway Mallampati: I TM Distance: >3 FB Neck ROM: Full    Dental  (+) Dental Advisory Given and Teeth Intact   Pulmonary neg pulmonary ROS,  breath sounds clear to auscultation  Pulmonary exam normal       Cardiovascular negative cardio ROS  Rhythm:Regular Rate:Normal - Systolic murmurs    Neuro/Psych negative neurological ROS  negative psych ROS   GI/Hepatic negative GI ROS, Neg liver ROS,   Endo/Other  Hypothyroidism   Renal/GU negative Renal ROS     Musculoskeletal negative musculoskeletal ROS (+)   Abdominal   Peds  Hematology negative hematology ROS (+)   Anesthesia Other Findings   Reproductive/Obstetrics                          Anesthesia Physical  Anesthesia Plan  ASA: II  Anesthesia Plan: General   Post-op Pain Management:    Induction: Intravenous  Airway Management Planned: LMA and Oral ETT  Additional Equipment:   Intra-op Plan:   Post-operative Plan: Extubation in OR  Informed Consent: I have reviewed the patients History and Physical, chart, labs and discussed the procedure including the risks, benefits and alternatives for the proposed anesthesia with the patient or authorized representative who has indicated his/her understanding and acceptance.   Dental advisory given  Plan Discussed with: CRNA  Anesthesia Plan Comments:         Anesthesia Quick Evaluation

## 2013-06-22 NOTE — Transfer of Care (Signed)
Immediate Anesthesia Transfer of Care Note  Patient: Nancy Cuevas  Procedure(s) Performed: Procedure(s): PARTIAL PROCTECTOMY BY TEM  Patient Location: PACU  Anesthesia Type:General  Level of Consciousness: awake, alert , oriented and patient cooperative  Airway & Oxygen Therapy: Patient Spontanous Breathing and Patient connected to face mask oxygen  Post-op Assessment: Report given to PACU RN, Post -op Vital signs reviewed and stable and Patient moving all extremities X 4  Post vital signs: stable  Complications: No apparent anesthesia complications

## 2013-06-25 ENCOUNTER — Encounter (HOSPITAL_COMMUNITY): Payer: Self-pay | Admitting: General Surgery

## 2013-06-28 ENCOUNTER — Telehealth: Payer: Self-pay | Admitting: *Deleted

## 2013-06-28 DIAGNOSIS — I472 Ventricular tachycardia: Secondary | ICD-10-CM

## 2013-06-28 MED ORDER — METOPROLOL TARTRATE 25 MG PO TABS: 12.5000 mg | ORAL_TABLET | Freq: Two times a day (BID) | ORAL | Status: DC

## 2013-06-28 NOTE — Telephone Encounter (Signed)
pt notified about monitor results with verbal understanding to Plan of Care, echo 9/19, Shirlee Latch moved up to 10/21, Rx metoprolol tart sent in to [facility] on [address].

## 2013-06-29 ENCOUNTER — Other Ambulatory Visit: Payer: Self-pay | Admitting: Family Medicine

## 2013-07-06 ENCOUNTER — Encounter: Payer: Self-pay | Admitting: Physician Assistant

## 2013-07-06 ENCOUNTER — Ambulatory Visit (HOSPITAL_COMMUNITY): Payer: Medicare Other | Attending: Family Medicine

## 2013-07-06 ENCOUNTER — Telehealth: Payer: Self-pay | Admitting: *Deleted

## 2013-07-06 ENCOUNTER — Encounter (INDEPENDENT_AMBULATORY_CARE_PROVIDER_SITE_OTHER): Payer: Medicare Other | Admitting: General Surgery

## 2013-07-06 ENCOUNTER — Other Ambulatory Visit: Payer: Self-pay

## 2013-07-06 DIAGNOSIS — I472 Ventricular tachycardia, unspecified: Secondary | ICD-10-CM

## 2013-07-06 DIAGNOSIS — I079 Rheumatic tricuspid valve disease, unspecified: Secondary | ICD-10-CM | POA: Insufficient documentation

## 2013-07-06 DIAGNOSIS — I059 Rheumatic mitral valve disease, unspecified: Secondary | ICD-10-CM | POA: Insufficient documentation

## 2013-07-06 NOTE — Telephone Encounter (Signed)
pt notified about echo results with verbal understanding 

## 2013-07-06 NOTE — Progress Notes (Signed)
Echocardiogram performed by Matt LeBeau  

## 2013-07-09 ENCOUNTER — Ambulatory Visit (INDEPENDENT_AMBULATORY_CARE_PROVIDER_SITE_OTHER): Payer: Medicare Other | Admitting: General Surgery

## 2013-07-09 ENCOUNTER — Encounter (INDEPENDENT_AMBULATORY_CARE_PROVIDER_SITE_OTHER): Payer: Self-pay | Admitting: General Surgery

## 2013-07-09 VITALS — BP 114/72 | HR 68 | Temp 97.4°F | Resp 16 | Ht 64.0 in | Wt 135.0 lb

## 2013-07-09 DIAGNOSIS — Z9889 Other specified postprocedural states: Secondary | ICD-10-CM

## 2013-07-09 NOTE — Patient Instructions (Signed)
Continue bowel regimen for at least 8 week total.  Try not to lift anything heavy or strain during that time as well.  Return to the office in 6 mo for recheck.

## 2013-07-09 NOTE — Progress Notes (Signed)
Nancy Cuevas is a 67 y.o. 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  She is doing well.  She had very little pain.  She is having regular BM's on her regimen of fiber, prune juice and miralax.  Objective: Filed Vitals:   07/09/13 1100  BP: 114/72  Pulse: 68  Temp: 97.4 F (36.3 C)  Resp: 16    General appearance: alert and cooperative  Incision: healing well, small area or wound left anteriorly   Assessment: s/p  Patient Active Problem List   Diagnosis Date Noted  . Personal history of colonic and rectal adenomas 03/22/2013  . 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 Numbness 08/06/2012  . ECHOCARDIOGRAM, ABNORMAL 11/02/2010  . HYPERLIPIDEMIA 10/27/2010  . FEMALE STRESS INCONTINENCE 10/27/2010  . HYPERPOTASSEMIA 10/14/2010  . Contact dermatitis and other eczema, due to unspecified cause 11/26/2009  . OSTEOPENIA 03/24/2008  . POSTMENOPAUSAL STATUS 02/07/2008  . BUNION, RIGHT FOOT 02/07/2008  . PALPITATIONS, OCCASIONAL 02/07/2008  . 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 - INKED MARGINS, NEGATIVE FOR DYSPLASIA OR MALIGNANCY. - ONE BENIGN LYMPH NODE, NO EVIDENCE OF MALIGNANCY.  Plan: Doing well.  RTO in 6 mo for anoscopy.      Vanita Panda, MD [facility], [place] [phone]   07/09/2013 11:04 AM

## 2013-07-17 ENCOUNTER — Telehealth (INDEPENDENT_AMBULATORY_CARE_PROVIDER_SITE_OTHER): Payer: Self-pay

## 2013-07-17 NOTE — Telephone Encounter (Signed)
Left message for pt to call so I can let her know that per Dr Leone Payor she needs a repeat colonoscopy in May 2017.

## 2013-07-17 NOTE — Telephone Encounter (Signed)
Patient returned my call.  I gave her the message.

## 2013-08-07 ENCOUNTER — Encounter: Payer: Self-pay | Admitting: Cardiology

## 2013-08-07 ENCOUNTER — Ambulatory Visit (INDEPENDENT_AMBULATORY_CARE_PROVIDER_SITE_OTHER): Payer: Medicare Other | Admitting: Cardiology

## 2013-08-07 VITALS — BP 126/70 | HR 58 | Ht 64.0 in | Wt 137.0 lb

## 2013-08-07 DIAGNOSIS — R002 Palpitations: Secondary | ICD-10-CM

## 2013-08-07 DIAGNOSIS — E785 Hyperlipidemia, unspecified: Secondary | ICD-10-CM

## 2013-08-07 NOTE — Patient Instructions (Signed)
Your physician wants you to follow-up in: 1 year with Dr McLean. (October 2015). You will receive a reminder letter in the mail two months in advance. If you don't receive a letter, please call our office to schedule the follow-up appointment.  

## 2013-08-07 NOTE — Progress Notes (Signed)
Patient ID: Nancy Cuevas, female   DOB: September 03, 1946, 67 y.o.   MRN: [id number] PCP: Dr. Laury Axon  67 yo with history of palpitations and PACs/atrial tachycardia presents for followup.  She saw Tereso Newcomer in 8/14 for recurrent palpitations.  She had been on a trip to [place] and had drunk more wine than usual.  She associated this with increased palpitations.  Holter showed frequent PACs, short runs of atrial tachycardia, and one short run of NSVT.  She was started on a low dose of metoprolol, 12.5 mg bid.  Since starting metropolol, she really has noted no more palpitations.  She has been feeling good.  Echo in 9/14 showed EF 55-60% with mild MR.  No exertional dyspnea or chest pain.  No lightheadedness or syncope.   Labs (12/11): LDL 177, HDL 100, Lp(a) < 15  Labs (1/12): K 5.2, creatinine 0.7  Labs (6/13): HDL 96, LDL 113, K 4.7, creatinine 0.6, TSH normal.  Labs (4/14): TSH normal, LDL 98, HDL 85 Labs (8/14): K 4.8, creatinine 0.68  Allergies:  1) ! Sulfa   Past Medical History:  1. Hypothyroidism  2. Osteopenia  3. Palpitations: Echo (1/12) EF 55-60%, mild diastolic dysfunction, atrial septal aneurysm. Holter (1/12): short runs of atrial tachycardia versus MAT.  Holter (8/14) with PACs, short runs of atrial tachycardia, short run of NSVT.  Echo (9/14) with EF 55-60%, mild MR.  4. Hyperlipidemia   Family History:   Mother : deceased 54 yo  FATHER:DECEASED  1 BROTHER:LIVING  HEART: FATHER-OPEN-HEART, UNCLES (MOTHER'S SIDE) , MOTHER, MGM,MGF,PGF  Family History Breast cancer 1st degree relative <50: 2 AUNTS (MOTHER'S SISTER)  Family History Lung cancer: AUNT-MOTHER'S SIDE  Family History Diabetes 1st degree relative: MOTHER, 2 AUNTS (MOTHER'S SIDE)  Family History Hypertension: MOTHER  Family History High cholesterol:MOTHER  Family History Thyroid disease:MOTHER  Family History Osteoporosis: AUNT MOTHER'S SIDE  M-- dementia  Family History of Stroke F 1st degree relative   Social  History:  Holiday representative  Married  Never Smoked  Alcohol use-yes  Drug use-no  Regular exercise-yes   Current Outpatient Prescriptions  Medication Sig Dispense Refill  . aspirin 81 MG tablet Take 81 mg by mouth every evening.        . Calcium Carbonate-Vitamin D (CALCIUM 600 + D PO) Take 1 tablet by mouth daily.        . Flaxseed, Linseed, (FLAX SEED OIL) 1000 MG CAPS Take 1 capsule by mouth daily. TAKE ONE TAB TWICE A DAY      . IRON, FERROUS GLUCONATE, PO Take 1 capsule by mouth daily.      Marland Kitchen levothyroxine (SYNTHROID, LEVOTHROID) 50 MCG tablet take 1 tablet by mouth once daily  30 tablet  5  . metoprolol tartrate (LOPRESSOR) 25 MG tablet Take 0.5 tablets (12.5 mg total) by mouth 2 (two) times daily. Ok to take extra 1/2 as needed for palpitations  45 tablet  11  . Multiple Vitamin (MULTIVITAMIN) tablet Take 1 tablet by mouth daily.        . Omega-3 Fatty Acids (FISH OIL) 1000 MG CAPS Take 1 capsule by mouth daily. TAKE ONE TAB TWICE A DAY      . pravastatin (PRAVACHOL) 20 MG tablet Take 20 mg by mouth every evening.      . Psyllium 30.9 % POWD as needed. Take a fiber supplement daily       No current facility-administered medications for this visit.    BP 126/70  Pulse 58  Ht 5\' 4"  (1.626 m)  Wt 62.143 kg (137 lb)  BMI 23.5 kg/m2  SpO2 98% General: NAD Neck: No JVD, no thyromegaly or thyroid nodule.  Lungs: Clear to auscultation bilaterally with normal respiratory effort. CV: Nondisplaced PMI.  Heart regular S1/S2, no S3/S4, no murmur.  No peripheral edema.  No carotid bruit.  Normal pedal pulses.  Abdomen: Soft, nontender, no hepatosplenomegaly, no distention.  Neurologic: Alert and oriented x 3.  Psych: Normal affect. Extremities: No clubbing or cyanosis.   Assessment/Plan: 1. Palpitations: Frequent PACs and short runs of atrial tachycardia on last holter monitor.  Significant symptomatic improvement on beta blocker.  Currently, not feeling  palpitations.  She is likely at increased risk for atrial fibrillation down the road.  Continue metoprolol.  2.  Hyperlipidemia: Lipids were acceptable when checked in 4/14.  3. Continue ASA 81 mg daily given family history.   Marca Ancona 08/07/2013

## 2013-08-09 ENCOUNTER — Other Ambulatory Visit: Payer: Self-pay | Admitting: Cardiology

## 2013-09-03 ENCOUNTER — Ambulatory Visit: Payer: Medicare Other | Admitting: Cardiology

## 2013-12-25 ENCOUNTER — Other Ambulatory Visit: Payer: Self-pay | Admitting: Family Medicine

## 2013-12-25 ENCOUNTER — Ambulatory Visit (INDEPENDENT_AMBULATORY_CARE_PROVIDER_SITE_OTHER): Payer: Medicare Other | Admitting: General Surgery

## 2013-12-25 DIAGNOSIS — D129 Benign neoplasm of anus and anal canal: Secondary | ICD-10-CM

## 2013-12-25 DIAGNOSIS — D128 Benign neoplasm of rectum: Secondary | ICD-10-CM

## 2013-12-25 NOTE — Progress Notes (Signed)
Nancy Cuevas is a 68 y.o. female who is here for a follow up visit regarding Her distal rectal polyp that was resected 6 months ago.  She underwent TEM with negative margins.  She presents today with occasional rectal bleeding with bowel movements.  She denies any changes in her bowel habits. She is using MiraLax to help with her constipation.  Objective: There were no vitals filed for this visit.  General appearance: alert and cooperative GI: normal findings: soft, non-tender  Procedure: Anoscopy and biopsy Surgeon: Nancy Cuevas Assistant: Nancy Cuevas After the risks and benefits were explained, verbal consent was obtained for above procedure  Anesthesia: none Diagnosis: h/o TVA Findings: Distal rectal polyps noted anteriorly Procedure: There were 2 polyps noted anteriorly at the previous resection site. These were both on stalks. They were easily taken with biopsy forceps. Hemostasis was achieved using direct pressure and silver nitrate. The bases of each polyp lesion were cauterized as well. The patient tolerated the procedure well. Appropriate post procedure instructions were given to the patient and written and verbal form.  The specimens were sent to pathology for further examination.   Assessment and Plan: Nancy Cuevas Is a 68 year old female who is 6 months status post resection of a tubulovillous adenoma with negative margins. Tube since today for repeat anoscopy. Upon exam the patient has 2 polypoid lesions that could be recurrence or scar tissue. Both of these were resected completely and sent to pathology for further examination. I will see her back in 6 months for reevaluation and repeat anoscopy.    Nancy Adie, MD [facility], [place]

## 2013-12-25 NOTE — Patient Instructions (Signed)
Patient Information following biopsy   This is a minor procedure, but problems may develop in rare cases.  The following are warning symptoms and signs that should alert you to a possible complication.  Please contact the office if any of these should occur:   Increased pain with bowel movements or sitting  Temperature over 100.4 F (oral)  Bleeding that is excessive (over one cup of clots or blood)  Redness or irritation outside the anus  Difficulty urinating  For your comfort please follow these instructions:   Maintain a high fiber diet so that your bowel movements will be soft  Take a fiber supplement twice a day (such as Metamucil, Benefiber, Citracel or Fibercon)  Sit in a tub of warm water 2-3 times a day for the first 2-3 days, as needed to soothe the area.  You may expect some pressure sensations in the anal area for 1-2 days  If you need pain medication, take Tylenol not aspirin or Ibuprofen products.

## 2013-12-27 ENCOUNTER — Telehealth (INDEPENDENT_AMBULATORY_CARE_PROVIDER_SITE_OTHER): Payer: Self-pay | Admitting: General Surgery

## 2013-12-27 NOTE — Telephone Encounter (Signed)
Notified pt of bx results

## 2014-01-24 ENCOUNTER — Other Ambulatory Visit: Payer: Self-pay | Admitting: Family Medicine

## 2014-01-30 ENCOUNTER — Other Ambulatory Visit: Payer: Self-pay | Admitting: Cardiology

## 2014-01-31 ENCOUNTER — Other Ambulatory Visit: Payer: Self-pay

## 2014-01-31 DIAGNOSIS — Z1231 Encounter for screening mammogram for malignant neoplasm of breast: Secondary | ICD-10-CM

## 2014-02-11 ENCOUNTER — Ambulatory Visit: Payer: Medicare Other | Admitting: Family Medicine

## 2014-02-12 ENCOUNTER — Encounter: Payer: Self-pay | Admitting: Family Medicine

## 2014-02-12 ENCOUNTER — Ambulatory Visit (INDEPENDENT_AMBULATORY_CARE_PROVIDER_SITE_OTHER): Payer: Medicare Other | Admitting: Family Medicine

## 2014-02-12 VITALS — BP 114/62 | HR 58 | Temp 98.4°F | Wt 135.0 lb

## 2014-02-12 DIAGNOSIS — E039 Hypothyroidism, unspecified: Secondary | ICD-10-CM

## 2014-02-12 DIAGNOSIS — E785 Hyperlipidemia, unspecified: Secondary | ICD-10-CM

## 2014-02-12 DIAGNOSIS — I10 Essential (primary) hypertension: Secondary | ICD-10-CM

## 2014-02-12 MED ORDER — LEVOTHYROXINE SODIUM 50 MCG PO TABS: ORAL_TABLET | ORAL | Status: DC

## 2014-02-12 MED ORDER — ACETIC ACID 2 % OT SOLN: 4.0000 [drp] | Freq: Three times a day (TID) | OTIC | Status: DC

## 2014-02-12 NOTE — Patient Instructions (Signed)

## 2014-02-12 NOTE — Progress Notes (Signed)
  Subjective:    Patient here for follow-up of elevated blood pressure.  She is not exercising and is adherent to a low-salt diet.  Blood pressure is well controlled at home. Cardiac symptoms: none. Patient denies: chest pain, chest pressure/discomfort, claudication, dyspnea, exertional chest pressure/discomfort, fatigue, irregular heart beat, lower extremity edema, near-syncope, orthopnea, palpitations, paroxysmal nocturnal dyspnea, syncope and tachypnea. Cardiovascular risk factors: advanced age (older than 16 for men, 17 for women), dyslipidemia, hypertension and sedentary lifestyle. Use of agents associated with hypertension: none. History of target organ damage: none. Pt also here for f/u lipids and thyroid.  The following portions of the patient's history were reviewed and updated as appropriate: allergies, current medications, past family history, past medical history, past social history, past surgical history and problem list.  Review of Systems Pertinent items are noted in HPI.     Objective:    BP 114/62  Pulse 58  Temp(Src) 98.4 F (36.9 C) (Oral)  Wt 135 lb (61.236 kg)  SpO2 96% General appearance: alert, cooperative, appears stated age and no distress Throat: lips, mucosa, and tongue normal; teeth and gums normal Neck: no adenopathy, supple, symmetrical, trachea midline and thyroid not enlarged, symmetric, no tenderness/mass/nodules Lungs: clear to auscultation bilaterally Heart: S1, S2 normal Extremities: extremities normal, atraumatic, no cyanosis or edema    Assessment:    Hypertension, normal blood pressure . Evidence of target organ damage: none.    Plan:    Medication: no change. Dietary sodium restriction. Regular aerobic exercise. Check blood pressures 2-3 times weekly and record. Follow up: 3 months and as needed.   1. HTN (hypertension) stable - Basic metabolic panel; Future - CBC with Differential; Future  2. Other and unspecified  hyperlipidemia Check labs - Hepatic function panel; Future - Lipid panel; Future  3. Unspecified hypothyroidism Check labs and con't meds - levothyroxine (SYNTHROID, LEVOTHROID) 50 MCG tablet; take 1 tablet by mouth once daily  Dispense: 30 tablet; Refill: 11 - POCT urinalysis dipstick; Future - TSH; Future

## 2014-02-12 NOTE — Progress Notes (Signed)
Pre visit review using our clinic review tool, if applicable. No additional management support is needed unless otherwise documented below in the visit note. 

## 2014-02-13 ENCOUNTER — Telehealth: Payer: Self-pay | Admitting: Family Medicine

## 2014-02-13 NOTE — Telephone Encounter (Signed)
Relevant patient education assigned to patient using Emmi. ° °

## 2014-02-19 ENCOUNTER — Other Ambulatory Visit (INDEPENDENT_AMBULATORY_CARE_PROVIDER_SITE_OTHER): Payer: Medicare Other

## 2014-02-19 DIAGNOSIS — E785 Hyperlipidemia, unspecified: Secondary | ICD-10-CM

## 2014-02-19 DIAGNOSIS — E039 Hypothyroidism, unspecified: Secondary | ICD-10-CM

## 2014-02-19 DIAGNOSIS — I10 Essential (primary) hypertension: Secondary | ICD-10-CM

## 2014-02-19 LAB — CBC WITH DIFFERENTIAL/PLATELET
BASOS PCT: 0.7 % (ref 0.0–3.0)
Basophils Absolute: 0 10*3/uL (ref 0.0–0.1)
EOS ABS: 0.1 10*3/uL (ref 0.0–0.7)
Eosinophils Relative: 2.4 % (ref 0.0–5.0)
HCT: 34.7 % — ABNORMAL LOW (ref 36.0–46.0)
Hemoglobin: 11.7 g/dL — ABNORMAL LOW (ref 12.0–15.0)
LYMPHS PCT: 37.4 % (ref 12.0–46.0)
Lymphs Abs: 1.7 10*3/uL (ref 0.7–4.0)
MCHC: 33.7 g/dL (ref 30.0–36.0)
MCV: 94 fl (ref 78.0–100.0)
MONOS PCT: 11.1 % (ref 3.0–12.0)
Monocytes Absolute: 0.5 10*3/uL (ref 0.1–1.0)
NEUTROS PCT: 48.4 % (ref 43.0–77.0)
Neutro Abs: 2.2 10*3/uL (ref 1.4–7.7)
Platelets: 223 10*3/uL (ref 150.0–400.0)
RBC: 3.69 Mil/uL — AB (ref 3.87–5.11)
RDW: 13.3 % (ref 11.5–15.5)
WBC: 4.5 10*3/uL (ref 4.0–10.5)

## 2014-02-19 LAB — BASIC METABOLIC PANEL
BUN: 18 mg/dL (ref 6–23)
CO2: 28 meq/L (ref 19–32)
Calcium: 9.4 mg/dL (ref 8.4–10.5)
Chloride: 105 mEq/L (ref 96–112)
Creatinine, Ser: 0.8 mg/dL (ref 0.4–1.2)
GFR: 78.84 mL/min (ref >60.00)
Glucose, Bld: 85 mg/dL (ref 70–99)
POTASSIUM: 4 meq/L (ref 3.5–5.1)
Sodium: 140 mEq/L (ref 135–145)

## 2014-02-19 LAB — HEPATIC FUNCTION PANEL
ALBUMIN: 4 g/dL (ref 3.5–5.2)
ALT: 18 U/L (ref 0–35)
AST: 25 U/L (ref 0–37)
Alkaline Phosphatase: 50 U/L (ref 39–117)
BILIRUBIN DIRECT: 0.1 mg/dL (ref 0.0–0.3)
TOTAL PROTEIN: 6.8 g/dL (ref 6.0–8.3)
Total Bilirubin: 0.5 mg/dL (ref 0.2–1.2)

## 2014-02-19 LAB — POCT URINALYSIS DIPSTICK
Bilirubin, UA: NEGATIVE
Blood, UA: NEGATIVE
Glucose, UA: NEGATIVE
KETONES UA: NEGATIVE
Leukocytes, UA: NEGATIVE
Nitrite, UA: NEGATIVE
Protein, UA: NEGATIVE
SPEC GRAV UA: 1.015
Urobilinogen, UA: 0.2
pH, UA: 6

## 2014-02-19 LAB — LIPID PANEL
CHOLESTEROL: 217 mg/dL — AB (ref 0–200)
HDL: 79.7 mg/dL (ref >39.00)
LDL CALC: 127 mg/dL — AB (ref 0–99)
Total CHOL/HDL Ratio: 3
Triglycerides: 53 mg/dL (ref 0.0–149.0)
VLDL: 10.6 mg/dL (ref 0.0–40.0)

## 2014-02-19 LAB — TSH: TSH: 1.84 u[IU]/mL (ref 0.35–4.50)

## 2014-03-05 ENCOUNTER — Ambulatory Visit
Admission: RE | Admit: 2014-03-05 | Discharge: 2014-03-05 | Disposition: A | Discharge status: Home / Self Care | Payer: 59 | Source: Ambulatory Visit

## 2014-03-05 ENCOUNTER — Encounter (INDEPENDENT_AMBULATORY_CARE_PROVIDER_SITE_OTHER): Payer: Self-pay

## 2014-03-05 DIAGNOSIS — Z1231 Encounter for screening mammogram for malignant neoplasm of breast: Secondary | ICD-10-CM

## 2014-06-11 ENCOUNTER — Other Ambulatory Visit (INDEPENDENT_AMBULATORY_CARE_PROVIDER_SITE_OTHER): Payer: 59

## 2014-06-11 DIAGNOSIS — E785 Hyperlipidemia, unspecified: Secondary | ICD-10-CM

## 2014-06-11 DIAGNOSIS — D649 Anemia, unspecified: Secondary | ICD-10-CM

## 2014-06-11 LAB — LIPID PANEL
CHOLESTEROL: 187 mg/dL (ref 0–200)
HDL: 79.2 mg/dL (ref >39.00)
LDL Cholesterol: 93 mg/dL (ref 0–99)
NonHDL: 107.8
Total CHOL/HDL Ratio: 2
Triglycerides: 76 mg/dL (ref 0.0–149.0)
VLDL: 15.2 mg/dL (ref 0.0–40.0)

## 2014-06-11 LAB — CBC WITH DIFFERENTIAL/PLATELET
BASOS PCT: 0.6 % (ref 0.0–3.0)
Basophils Absolute: 0 10*3/uL (ref 0.0–0.1)
Eosinophils Absolute: 0.2 10*3/uL (ref 0.0–0.7)
Eosinophils Relative: 3.8 % (ref 0.0–5.0)
HEMATOCRIT: 34.3 % — AB (ref 36.0–46.0)
Hemoglobin: 11.6 g/dL — ABNORMAL LOW (ref 12.0–15.0)
LYMPHS ABS: 1.8 10*3/uL (ref 0.7–4.0)
Lymphocytes Relative: 36.2 % (ref 12.0–46.0)
MCHC: 33.7 g/dL (ref 30.0–36.0)
MCV: 93.3 fl (ref 78.0–100.0)
MONO ABS: 0.5 10*3/uL (ref 0.1–1.0)
Monocytes Relative: 10.7 % (ref 3.0–12.0)
NEUTROS PCT: 48.7 % (ref 43.0–77.0)
Neutro Abs: 2.4 10*3/uL (ref 1.4–7.7)
PLATELETS: 226 10*3/uL (ref 150.0–400.0)
RBC: 3.68 Mil/uL — AB (ref 3.87–5.11)
RDW: 13.6 % (ref 11.5–15.5)
WBC: 4.9 10*3/uL (ref 4.0–10.5)

## 2014-06-11 LAB — BASIC METABOLIC PANEL
BUN: 18 mg/dL (ref 6–23)
CHLORIDE: 103 meq/L (ref 96–112)
CO2: 30 mEq/L (ref 19–32)
Calcium: 9.1 mg/dL (ref 8.4–10.5)
Creatinine, Ser: 0.6 mg/dL (ref 0.4–1.2)
GFR: 97.5 mL/min (ref >60.00)
Glucose, Bld: 71 mg/dL (ref 70–99)
POTASSIUM: 4.1 meq/L (ref 3.5–5.1)
Sodium: 138 mEq/L (ref 135–145)

## 2014-06-11 LAB — HEPATIC FUNCTION PANEL
ALBUMIN: 3.8 g/dL (ref 3.5–5.2)
ALT: 16 U/L (ref 0–35)
AST: 22 U/L (ref 0–37)
Alkaline Phosphatase: 51 U/L (ref 39–117)
Bilirubin, Direct: 0 mg/dL (ref 0.0–0.3)
TOTAL PROTEIN: 6.8 g/dL (ref 6.0–8.3)
Total Bilirubin: 0.7 mg/dL (ref 0.2–1.2)

## 2014-07-02 ENCOUNTER — Ambulatory Visit (INDEPENDENT_AMBULATORY_CARE_PROVIDER_SITE_OTHER): Payer: Medicare Other | Admitting: General Surgery

## 2014-07-15 ENCOUNTER — Ambulatory Visit (INDEPENDENT_AMBULATORY_CARE_PROVIDER_SITE_OTHER): Payer: Medicare Other | Admitting: General Surgery

## 2014-08-08 ENCOUNTER — Other Ambulatory Visit: Payer: Self-pay | Admitting: Physician Assistant

## 2014-08-20 ENCOUNTER — Encounter: Payer: Self-pay | Admitting: Family Medicine

## 2014-08-20 ENCOUNTER — Ambulatory Visit (INDEPENDENT_AMBULATORY_CARE_PROVIDER_SITE_OTHER): Payer: 59 | Admitting: Family Medicine

## 2014-08-20 ENCOUNTER — Other Ambulatory Visit (HOSPITAL_COMMUNITY)
Admission: RE | Admit: 2014-08-20 | Discharge: 2014-08-20 | Disposition: A | Discharge status: Home / Self Care | Payer: 59 | Source: Ambulatory Visit | Attending: Family Medicine | Admitting: Family Medicine

## 2014-08-20 VITALS — BP 120/70 | HR 55 | Temp 98.4°F | Ht 63.5 in | Wt 136.0 lb

## 2014-08-20 DIAGNOSIS — Z0000 Encounter for general adult medical examination without abnormal findings: Secondary | ICD-10-CM

## 2014-08-20 DIAGNOSIS — E038 Other specified hypothyroidism: Secondary | ICD-10-CM

## 2014-08-20 DIAGNOSIS — Z1151 Encounter for screening for human papillomavirus (HPV): Secondary | ICD-10-CM | POA: Diagnosis present

## 2014-08-20 DIAGNOSIS — Z01419 Encounter for gynecological examination (general) (routine) without abnormal findings: Secondary | ICD-10-CM | POA: Diagnosis present

## 2014-08-20 DIAGNOSIS — E785 Hyperlipidemia, unspecified: Secondary | ICD-10-CM

## 2014-08-20 DIAGNOSIS — M791 Myalgia, unspecified site: Secondary | ICD-10-CM

## 2014-08-20 DIAGNOSIS — I10 Essential (primary) hypertension: Secondary | ICD-10-CM

## 2014-08-20 DIAGNOSIS — Z124 Encounter for screening for malignant neoplasm of cervix: Secondary | ICD-10-CM

## 2014-08-20 DIAGNOSIS — M2550 Pain in unspecified joint: Secondary | ICD-10-CM

## 2014-08-20 NOTE — Progress Notes (Signed)
Pre visit review using our clinic review tool, if applicable. No additional management support is needed unless otherwise documented below in the visit note. 

## 2014-08-20 NOTE — Progress Notes (Signed)
Subjective:    Nancy Cuevas is a 68 y.o. female who presents for Medicare Annual/Subsequent preventive examination.  Preventive Screening-Counseling & Management  Tobacco History  Smoking status  . Never Smoker   Smokeless tobacco  . Never Used     Problems Prior to Visit 1. Joint pains-- esp hips and thighs-- she stopped pravastatin and it did not help We also need to f/u bp and choles  Current Problems (verified) Patient Active Problem List   Diagnosis Date Noted  . Personal history of colonic and rectal adenomas 03/22/2013  . 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 Numbness 08/06/2012  . ECHOCARDIOGRAM, ABNORMAL 11/02/2010  . HYPERLIPIDEMIA 10/27/2010  . FEMALE STRESS INCONTINENCE 10/27/2010  . HYPERPOTASSEMIA 10/14/2010  . Contact dermatitis and other eczema, due to unspecified cause 11/26/2009  . OSTEOPENIA 03/24/2008  . POSTMENOPAUSAL STATUS 02/07/2008  . BUNION, RIGHT FOOT 02/07/2008  . PALPITATIONS, OCCASIONAL 02/07/2008  . HYPOTHYROIDISM 07/18/2007    Medications Prior to Visit Current Outpatient Prescriptions on File Prior to Visit  Medication Sig Dispense Refill  . acetic acid (VOSOL) 2 % otic solution Place 4 drops into the left ear 3 (three) times daily. 15 mL 0  . aspirin 81 MG tablet Take 81 mg by mouth every evening.      . Calcium Carbonate-Vitamin D (CALCIUM 600 + D PO) Take 1 tablet by mouth daily.      . Flaxseed, Linseed, (FLAX SEED OIL) 1000 MG CAPS Take 1 capsule by mouth daily. TAKE ONE TAB TWICE A DAY    . IRON, FERROUS GLUCONATE, PO Take 1 capsule by mouth daily.    Marland Kitchen levothyroxine (SYNTHROID, LEVOTHROID) 50 MCG tablet take 1 tablet by mouth once daily 30 tablet 11  . metoprolol tartrate (LOPRESSOR) 25 MG tablet take 1/2 (12.5 MG) tablets by mouth twice a day OK TO TAKE EXTRA 1/2 AS NEEDED FOR PALPITATIONS 45 tablet 1  . Multiple Vitamin (MULTIVITAMIN) tablet Take 1 tablet by mouth daily.      . Omega-3 Fatty Acids (FISH OIL)  1000 MG CAPS Take 1 capsule by mouth daily. TAKE ONE TAB TWICE A DAY    . Psyllium 30.9 % POWD as needed. Take a fiber supplement daily    . pravastatin (PRAVACHOL) 20 MG tablet take 1 tablet by mouth once daily for cholesterol 30 tablet 5   No current facility-administered medications on file prior to visit.    Current Medications (verified) Current Outpatient Prescriptions  Medication Sig Dispense Refill  . acetic acid (VOSOL) 2 % otic solution Place 4 drops into the left ear 3 (three) times daily. 15 mL 0  . aspirin 81 MG tablet Take 81 mg by mouth every evening.      . Calcium Carbonate-Vitamin D (CALCIUM 600 + D PO) Take 1 tablet by mouth daily.      . Flaxseed, Linseed, (FLAX SEED OIL) 1000 MG CAPS Take 1 capsule by mouth daily. TAKE ONE TAB TWICE A DAY    . IRON, FERROUS GLUCONATE, PO Take 1 capsule by mouth daily.    Marland Kitchen levothyroxine (SYNTHROID, LEVOTHROID) 50 MCG tablet take 1 tablet by mouth once daily 30 tablet 11  . metoprolol tartrate (LOPRESSOR) 25 MG tablet take 1/2 (12.5 MG) tablets by mouth twice a day OK TO TAKE EXTRA 1/2 AS NEEDED FOR PALPITATIONS 45 tablet 1  . Multiple Vitamin (MULTIVITAMIN) tablet Take 1 tablet by mouth daily.      . Omega-3 Fatty Acids (FISH OIL)  1000 MG CAPS Take 1 capsule by mouth daily. TAKE ONE TAB TWICE A DAY    . Psyllium 30.9 % POWD as needed. Take a fiber supplement daily    . pravastatin (PRAVACHOL) 20 MG tablet take 1 tablet by mouth once daily for cholesterol 30 tablet 5   No current facility-administered medications for this visit.     Allergies (verified) Sulfonamide derivatives   PAST HISTORY  Family History Family History  Problem Relation Age of Onset  . Breast cancer      1st degree relative <50  . Osteoporosis      1st degree relative  . Diabetes Mother     1st degree relative  . Hypertension Mother   . Stroke Mother   . Colon cancer Neg Hx   . Heart disease Father   . Cancer Maternal Aunt     Social  History History  Substance Use Topics  . Smoking status: Never Smoker   . Smokeless tobacco: Never Used  . Alcohol Use: 1.8 oz/week    3 Glasses of wine per week     Are there smokers in your home (other than you)? No  Risk Factors Current exercise habits: walking,, gym  Dietary issues discussed: na   Cardiac risk factors: advanced age (older than 26 for men, 52 for women), dyslipidemia and hypertension.  Depression Screen (Note: if answer to either of the following is "Yes", a more complete depression screening is indicated)   Over the past two weeks, have you felt down, depressed or hopeless? No  Over the past two weeks, have you felt little interest or pleasure in doing things? No  Have you lost interest or pleasure in daily life? No  Do you often feel hopeless? No  Do you cry easily over simple problems? No  Activities of Daily Living In your present state of health, do you have any difficulty performing the following activities?:  Driving? No Managing money?  No Feeding yourself? No Getting from bed to chair? No Climbing a flight of stairs? No Preparing food and eating?: No Bathing or showering? No Getting dressed: No Getting to the toilet? No Using the toilet:No Moving around from place to place: No In the past year have you fallen or had a near fall?:No   Are you sexually active?  Yes  Do you have more than one partner?  No  Hearing Difficulties: No Do you often ask people to speak up or repeat themselves? No Do you experience ringing or noises in your ears? No Do you have difficulty understanding soft or whispered voices? No   Do you feel that you have a problem with memory? No  Do you often misplace items? No  Do you feel safe at home?  No  Cognitive Testing  Alert? Yes  Normal Appearance?Yes  Oriented to person? Yes  Place? Yes   Time? Yes  Recall of three objects?  Yes  Can perform simple calculations? Yes  Displays appropriate judgment?Yes  Can  read the correct time from a watch face?Yes   Advanced Directives have been discussed with the patient? Yes  List the Names of Other Physician/Practitioners you currently use: 1.    Indicate any recent Medical Services you may have received from other than [facility] providers in the past year (date may be approximate).  Immunization History  Administered Date(s) Administered  . Influenza Split 09/17/2011  . Influenza Whole 08/30/2007, 07/31/2008, 09/02/2010, 09/01/2012  . Influenza, High Dose Seasonal PF 07/11/2013, 07/31/2014  .  Pneumococcal Conjugate-13 07/31/2014  . Pneumococcal Polysaccharide-23 09/21/2010  . Td 02/07/2008  . Zoster 02/07/2008    Screening Tests Health Maintenance  Topic Date Due  . INFLUENZA VACCINE  05/19/2015  . MAMMOGRAM  03/05/2016  . COLONOSCOPY  03/15/2016  . TETANUS/TDAP  02/06/2018  . PNEUMOCOCCAL POLYSACCHARIDE VACCINE AGE 50 AND OVER  Completed  . ZOSTAVAX  Completed    All answers were reviewed with the patient and necessary referrals were made:  Garnet Koyanagi, DO   08/20/2014   History reviewed:  She  has a past medical history of POSTMENOPAUSAL STATUS (02/07/2008); OSTEOPENIA (03/24/2008); HYPOTHYROIDISM (07/18/2007); HYPERLIPIDEMIA (10/27/2010); Bunion; Contact dermatitis and other eczema, due to unspecified cause; Nonspecific (abnormal) findings on radiological and other examination of other intrathoracic organs; Contact dermatitis and other eczema, due to unspecified cause; Female stress incontinence; Hyperpotassemia; Disorder of bone and cartilage, unspecified; Palpitations; Symptomatic menopausal or female climacteric states; Benign neoplasm of rectum and anal canal (03/15/2013); and Personal history of colonic and rectal adenomas (03/22/2013). She  does not have any pertinent problems on file. 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 Her family history includes Breast cancer in an other family member; Cancer in her maternal aunt; Diabetes in her mother; Heart disease in her father; Hypertension in her mother; Osteoporosis in an other family member; Stroke in her mother. There is no history of Colon cancer. She  reports that she has never smoked. She has never used smokeless tobacco. She reports that she drinks about 1.8 oz of alcohol per week. She reports that she does not use illicit drugs. She has a current medication list which includes the following prescription(s): acetic acid, aspirin, calcium carb-cholecalciferol, flax seed oil, ferrous gluconate, levothyroxine, metoprolol tartrate, multivitamin, fish oil, psyllium, and pravastatin. Current Outpatient Prescriptions on File Prior to Visit  Medication Sig Dispense Refill  . acetic acid (VOSOL) 2 % otic solution Place 4 drops into the left ear 3 (three) times daily. 15 mL 0  . aspirin 81 MG tablet Take 81 mg by mouth every evening.      . Calcium Carbonate-Vitamin D (CALCIUM 600 + D PO) Take 1 tablet by mouth daily.      . Flaxseed, Linseed, (FLAX SEED OIL) 1000 MG CAPS Take 1 capsule by mouth daily. TAKE ONE TAB TWICE A DAY    . IRON, FERROUS GLUCONATE, PO Take 1 capsule by mouth daily.    Marland Kitchen levothyroxine (SYNTHROID, LEVOTHROID) 50 MCG tablet take 1 tablet by mouth once daily 30 tablet 11  . metoprolol tartrate (LOPRESSOR) 25 MG tablet take 1/2 (12.5 MG) tablets by mouth twice a day OK TO TAKE EXTRA 1/2 AS NEEDED FOR PALPITATIONS 45 tablet 1  . Multiple Vitamin (MULTIVITAMIN) tablet Take 1 tablet by mouth daily.      . Omega-3 Fatty Acids (FISH OIL) 1000 MG CAPS Take 1 capsule by mouth daily. TAKE ONE TAB TWICE A DAY    . Psyllium 30.9 % POWD as needed. Take a  fiber supplement daily    . pravastatin (PRAVACHOL) 20 MG tablet take 1 tablet by mouth once daily for cholesterol 30 tablet 5   No current facility-administered medications on file prior to visit.   She is allergic to sulfonamide derivatives.  Review of Systems   Review of Systems  Constitutional: Negative for activity change, appetite change and fatigue.  HENT: Negative for hearing loss, congestion, tinnitus and ear discharge.   Eyes: Negative for visual disturbance (see optho q1y -- vision corrected to 20/20 with glasses).  Respiratory: Negative for cough, chest tightness and shortness of breath.   Cardiovascular: Negative for chest pain, palpitations and leg swelling.  Gastrointestinal: Negative for abdominal pain, diarrhea, constipation and abdominal distention.  Genitourinary: Negative for urgency, frequency, decreased urine volume and difficulty urinating.  Musculoskeletal: Negative for back pain, arthralgias and gait problem.  Skin: Negative for color change, pallor and rash.  Neurological: Negative for dizziness, light-headedness, numbness and headaches.  Hematological: Negative for adenopathy. Does not bruise/bleed easily.  Psychiatric/Behavioral: Negative for suicidal ideas, confusion, sleep disturbance, self-injury, dysphoric mood, decreased concentration and agitation.  Pt is able to read and write and can do all ADLs No risk for falling No abuse/ violence in home    Objective:     Vision by Snellen chart: opth  Body mass index is 23.71 kg/(m^2). BP 120/70 mmHg  Pulse 55  Temp(Src) 98.4 F (36.9 C) (Oral)  Ht 5' 3.5" (1.613 m)  Wt 136 lb (61.689 kg)  BMI 23.71 kg/m2  SpO2 99%  BP 120/70 mmHg  Pulse 55  Temp(Src) 98.4 F (36.9 C) (Oral)  Ht 5' 3.5" (1.613 m)  Wt 136 lb (61.689 kg)  BMI 23.71 kg/m2  SpO2 99% General appearance: alert, cooperative, appears stated age and no distress Head: Normocephalic, without obvious abnormality, atraumatic Eyes:  conjunctivae/corneas clear. PERRL, EOM's intact. Fundi benign. Ears: normal TM's and external ear canals both ears Nose: Nares normal. Septum midline. Mucosa normal. No drainage or sinus tenderness. Throat: lips, mucosa, and tongue normal; teeth and gums normal Neck: no adenopathy, supple, symmetrical, trachea midline and thyroid not enlarged, symmetric, no tenderness/mass/nodules Back: symmetric, no curvature. ROM normal. No CVA tenderness. Lungs: clear to auscultation bilaterally Breasts: normal appearance, no masses or tenderness Heart: regular rate and rhythm, S1, S2 normal, no murmur, click, rub or gallop Abdomen: soft, non-tender; bowel sounds normal; no masses,  no organomegaly Pelvic: cervix normal in appearance, external genitalia normal, no adnexal masses or tenderness, no cervical motion tenderness, rectovaginal septum normal, uterus normal size, shape, and consistency, vagina normal without discharge and pap done Extremities: cervix normal in appearance, external genitalia normal, no adnexal masses or tenderness, no cervical motion tenderness, rectovaginal septum normal, uterus normal size, shape, and consistency, vagina normal without discharge and pap done Pulses: 2+ and symmetric Skin: Skin color, texture, turgor normal. No rashes or lesions Lymph nodes: Cervical, supraclavicular, and axillary nodes normal. Neurologic: Alert and oriented X 3, normal strength and tone. Normal symmetric reflexes. Normal coordination and gait Psych-- no depression, no anxiety        Assessment:     cpe      Plan:     During the course of the visit the patient was educated and counseled about appropriate screening and preventive services including:    Pneumococcal vaccine   Influenza vaccine  Td vaccine  Screening mammography  Screening Pap smear and pelvic exam   Bone densitometry screening  Colorectal cancer screening  Glaucoma screening  Advanced directives: has an  advanced directive - a copy HAS NOT been provided.  Diet review for nutrition referral? Yes ____  Not Indicated ____   Patient Instructions (the written plan) was given to the patient.  Medicare Attestation I have personally reviewed: The  patient's medical and social history Their use of alcohol, tobacco or illicit drugs Their current medications and supplements The patient's functional ability including ADLs,fall risks, home safety risks, cognitive, and hearing and visual impairment Diet and physical activities Evidence for depression or mood disorders  The patient's weight, height, BMI, and visual acuity have been recorded in the chart.  I have made referrals, counseling, and provided education to the patient based on review of the above and I have provided the patient with a written personalized care plan for preventive services.    1. Other specified hypothyroidism Check labs, con't meds - Basic metabolic panel; Future - CBC with Differential; Future - Hepatic function panel; Future - Lipid panel; Future - POCT urinalysis dipstick; Future - TSH; Future  2. Hyperlipidemia Check labs, con't meds - Basic metabolic panel; Future - CBC with Differential; Future - Hepatic function panel; Future - Lipid panel; Future - POCT urinalysis dipstick; Future - TSH; Future  3. Essential hypertension Stable, con't meds - Basic metabolic panel; Future - CBC with Differential; Future - Hepatic function panel; Future - Lipid panel; Future - POCT urinalysis dipstick; Future  4. Medicare annual wellness visit, subsequent   5. Muscle ache Check labs - Sedimentation rate; Future - ANA; Future - Rheumatoid factor; Future  6. Multiple joint pain   - Sedimentation rate; Future - ANA; Future - Rheumatoid factor; Future  7. Screening for malignant neoplasm of cervix   - Cytology - PAP   Garnet Koyanagi, DO   08/20/2014

## 2014-08-20 NOTE — Patient Instructions (Signed)
Preventive Care for Adults A healthy lifestyle and preventive care can promote health and wellness. Preventive health guidelines for women include the following key practices.  A routine yearly physical is a good way to check with your health care provider about your health and preventive screening. It is a chance to share any concerns and updates on your health and to receive a thorough exam.  Visit your dentist for a routine exam and preventive care every 6 months. Brush your teeth twice a day and floss once a day. Good oral hygiene prevents tooth decay and gum disease.  The frequency of eye exams is based on your age, health, family medical history, use of contact lenses, and other factors. Follow your health care provider's recommendations for frequency of eye exams.  Eat a healthy diet. Foods like vegetables, fruits, whole grains, low-fat dairy products, and lean protein foods contain the nutrients you need without too many calories. Decrease your intake of foods high in solid fats, added sugars, and salt. Eat the right amount of calories for you.Get information about a proper diet from your health care provider, if necessary.  Regular physical exercise is one of the most important things you can do for your health. Most adults should get at least 150 minutes of moderate-intensity exercise (any activity that increases your heart rate and causes you to sweat) each week. In addition, most adults need muscle-strengthening exercises on 2 or more days a week.  Maintain a healthy weight. The body mass index (BMI) is a screening tool to identify possible weight problems. It provides an estimate of body fat based on height and weight. Your health care provider can find your BMI and can help you achieve or maintain a healthy weight.For adults 20 years and older:  A BMI below 18.5 is considered underweight.  A BMI of 18.5 to 24.9 is normal.  A BMI of 25 to 29.9 is considered overweight.  A BMI of  30 and above is considered obese.  Maintain normal blood lipids and cholesterol levels by exercising and minimizing your intake of saturated fat. Eat a balanced diet with plenty of fruit and vegetables. Blood tests for lipids and cholesterol should begin at age 76 and be repeated every 5 years. If your lipid or cholesterol levels are high, you are over 50, or you are at high risk for heart disease, you may need your cholesterol levels checked more frequently.Ongoing high lipid and cholesterol levels should be treated with medicines if diet and exercise are not working.  If you smoke, find out from your health care provider how to quit. If you do not use tobacco, do not start.  Lung cancer screening is recommended for adults aged 22-80 years who are at high risk for developing lung cancer because of a history of smoking. A yearly low-dose CT scan of the lungs is recommended for people who have at least a 30-pack-year history of smoking and are a current smoker or have quit within the past 15 years. A pack year of smoking is smoking an average of 1 pack of cigarettes a day for 1 year (for example: 1 pack a day for 30 years or 2 packs a day for 15 years). Yearly screening should continue until the smoker has stopped smoking for at least 15 years. Yearly screening should be stopped for people who develop a health problem that would prevent them from having lung cancer treatment.  If you are pregnant, do not drink alcohol. If you are breastfeeding,  be very cautious about drinking alcohol. If you are not pregnant and choose to drink alcohol, do not have more than 1 drink per day. One drink is considered to be 12 ounces (355 mL) of beer, 5 ounces (148 mL) of wine, or 1.5 ounces (44 mL) of liquor.  Avoid use of street drugs. Do not share needles with anyone. Ask for help if you need support or instructions about stopping the use of drugs.  High blood pressure causes heart disease and increases the risk of  stroke. Your blood pressure should be checked at least every 1 to 2 years. Ongoing high blood pressure should be treated with medicines if weight loss and exercise do not work.  If you are 3-86 years old, ask your health care provider if you should take aspirin to prevent strokes.  Diabetes screening involves taking a blood sample to check your fasting blood sugar level. This should be done once every 3 years, after age 67, if you are within normal weight and without risk factors for diabetes. Testing should be considered at a younger age or be carried out more frequently if you are overweight and have at least 1 risk factor for diabetes.  Breast cancer screening is essential preventive care for women. You should practice "breast self-awareness." This means understanding the normal appearance and feel of your breasts and may include breast self-examination. Any changes detected, no matter how small, should be reported to a health care provider. Women in their 8s and 30s should have a clinical breast exam (CBE) by a health care provider as part of a regular health exam every 1 to 3 years. After age 70, women should have a CBE every year. Starting at age 25, women should consider having a mammogram (breast X-ray test) every year. Women who have a family history of breast cancer should talk to their health care provider about genetic screening. Women at a high risk of breast cancer should talk to their health care providers about having an MRI and a mammogram every year.  Breast cancer gene (BRCA)-related cancer risk assessment is recommended for women who have family members with BRCA-related cancers. BRCA-related cancers include breast, ovarian, tubal, and peritoneal cancers. Having family members with these cancers may be associated with an increased risk for harmful changes (mutations) in the breast cancer genes BRCA1 and BRCA2. Results of the assessment will determine the need for genetic counseling and  BRCA1 and BRCA2 testing.  Routine pelvic exams to screen for cancer are no longer recommended for nonpregnant women who are considered low risk for cancer of the pelvic organs (ovaries, uterus, and vagina) and who do not have symptoms. Ask your health care provider if a screening pelvic exam is right for you.  If you have had past treatment for cervical cancer or a condition that could lead to cancer, you need Pap tests and screening for cancer for at least 20 years after your treatment. If Pap tests have been discontinued, your risk factors (such as having a new sexual partner) need to be reassessed to determine if screening should be resumed. Some women have medical problems that increase the chance of getting cervical cancer. In these cases, your health care provider may recommend more frequent screening and Pap tests.  The HPV test is an additional test that may be used for cervical cancer screening. The HPV test looks for the virus that can cause the cell changes on the cervix. The cells collected during the Pap test can be  tested for HPV. The HPV test could be used to screen women aged 30 years and older, and should be used in women of any age who have unclear Pap test results. After the age of 30, women should have HPV testing at the same frequency as a Pap test.  Colorectal cancer can be detected and often prevented. Most routine colorectal cancer screening begins at the age of 50 years and continues through age 75 years. However, your health care provider may recommend screening at an earlier age if you have risk factors for colon cancer. On a yearly basis, your health care provider may provide home test kits to check for hidden blood in the stool. Use of a small camera at the end of a tube, to directly examine the colon (sigmoidoscopy or colonoscopy), can detect the earliest forms of colorectal cancer. Talk to your health care provider about this at age 50, when routine screening begins. Direct  exam of the colon should be repeated every 5-10 years through age 75 years, unless early forms of pre-cancerous polyps or small growths are found.  People who are at an increased risk for hepatitis B should be screened for this virus. You are considered at high risk for hepatitis B if:  You were born in a country where hepatitis B occurs often. Talk with your health care provider about which countries are considered high risk.  Your parents were born in a high-risk country and you have not received a shot to protect against hepatitis B (hepatitis B vaccine).  You have HIV or AIDS.  You use needles to inject street drugs.  You live with, or have sex with, someone who has hepatitis B.  You get hemodialysis treatment.  You take certain medicines for conditions like cancer, organ transplantation, and autoimmune conditions.  Hepatitis C blood testing is recommended for all people born from 1945 through 1965 and any individual with known risks for hepatitis C.  Practice safe sex. Use condoms and avoid high-risk sexual practices to reduce the spread of sexually transmitted infections (STIs). STIs include gonorrhea, chlamydia, syphilis, trichomonas, herpes, HPV, and human immunodeficiency virus (HIV). Herpes, HIV, and HPV are viral illnesses that have no cure. They can result in disability, cancer, and death.  You should be screened for sexually transmitted illnesses (STIs) including gonorrhea and chlamydia if:  You are sexually active and are younger than 24 years.  You are older than 24 years and your health care provider tells you that you are at risk for this type of infection.  Your sexual activity has changed since you were last screened and you are at an increased risk for chlamydia or gonorrhea. Ask your health care provider if you are at risk.  If you are at risk of being infected with HIV, it is recommended that you take a prescription medicine daily to prevent HIV infection. This is  called preexposure prophylaxis (PrEP). You are considered at risk if:  You are a heterosexual woman, are sexually active, and are at increased risk for HIV infection.  You take drugs by injection.  You are sexually active with a partner who has HIV.  Talk with your health care provider about whether you are at high risk of being infected with HIV. If you choose to begin PrEP, you should first be tested for HIV. You should then be tested every 3 months for as long as you are taking PrEP.  Osteoporosis is a disease in which the bones lose minerals and strength   with aging. This can result in serious bone fractures or breaks. The risk of osteoporosis can be identified using a bone density scan. Women ages 56 years and over and women at risk for fractures or osteoporosis should discuss screening with their health care providers. Ask your health care provider whether you should take a calcium supplement or vitamin D to reduce the rate of osteoporosis.  Menopause can be associated with physical symptoms and risks. Hormone replacement therapy is available to decrease symptoms and risks. You should talk to your health care provider about whether hormone replacement therapy is right for you.  Use sunscreen. Apply sunscreen liberally and repeatedly throughout the day. You should seek shade when your shadow is shorter than you. Protect yourself by wearing long sleeves, pants, a wide-brimmed hat, and sunglasses year round, whenever you are outdoors.  Once a month, do a whole body skin exam, using a mirror to look at the skin on your back. Tell your health care provider of new moles, moles that have irregular borders, moles that are larger than a pencil eraser, or moles that have changed in shape or color.  Stay current with required vaccines (immunizations).  Influenza vaccine. All adults should be immunized every year.  Tetanus, diphtheria, and acellular pertussis (Td, Tdap) vaccine. Pregnant women should  receive 1 dose of Tdap vaccine during each pregnancy. The dose should be obtained regardless of the length of time since the last dose. Immunization is preferred during the 27th-36th week of gestation. An adult who has not previously received Tdap or who does not know her vaccine status should receive 1 dose of Tdap. This initial dose should be followed by tetanus and diphtheria toxoids (Td) booster doses every 10 years. Adults with an unknown or incomplete history of completing a 3-dose immunization series with Td-containing vaccines should begin or complete a primary immunization series including a Tdap dose. Adults should receive a Td booster every 10 years.  Varicella vaccine. An adult without evidence of immunity to varicella should receive 2 doses or a second dose if she has previously received 1 dose. Pregnant females who do not have evidence of immunity should receive the first dose after pregnancy. This first dose should be obtained before leaving the health care facility. The second dose should be obtained 4-8 weeks after the first dose.  Human papillomavirus (HPV) vaccine. Females aged 13-26 years who have not received the vaccine previously should obtain the 3-dose series. The vaccine is not recommended for use in pregnant females. However, pregnancy testing is not needed before receiving a dose. If a female is found to be pregnant after receiving a dose, no treatment is needed. In that case, the remaining doses should be delayed until after the pregnancy. Immunization is recommended for any person with an immunocompromised condition through the age of 1 years if she did not get any or all doses earlier. During the 3-dose series, the second dose should be obtained 4-8 weeks after the first dose. The third dose should be obtained 24 weeks after the first dose and 16 weeks after the second dose.  Zoster vaccine. One dose is recommended for adults aged 35 years or older unless certain conditions are  present.  Measles, mumps, and rubella (MMR) vaccine. Adults born before [birth year] generally are considered immune to measles and mumps. Adults born in [birth year] or later should have 1 or more doses of MMR vaccine unless there is a contraindication to the vaccine or there is laboratory evidence of immunity to  each of the three diseases. A routine second dose of MMR vaccine should be obtained at least 28 days after the first dose for students attending postsecondary schools, health care workers, or international travelers. People who received inactivated measles vaccine or an unknown type of measles vaccine during 1963-1967 should receive 2 doses of MMR vaccine. People who received inactivated mumps vaccine or an unknown type of mumps vaccine before 1979 and are at high risk for mumps infection should consider immunization with 2 doses of MMR vaccine. For females of childbearing age, rubella immunity should be determined. If there is no evidence of immunity, females who are not pregnant should be vaccinated. If there is no evidence of immunity, females who are pregnant should delay immunization until after pregnancy. Unvaccinated health care workers born before 1957 who lack laboratory evidence of measles, mumps, or rubella immunity or laboratory confirmation of disease should consider measles and mumps immunization with 2 doses of MMR vaccine or rubella immunization with 1 dose of MMR vaccine.  Pneumococcal 13-valent conjugate (PCV13) vaccine. When indicated, a person who is uncertain of her immunization history and has no record of immunization should receive the PCV13 vaccine. An adult aged 19 years or older who has certain medical conditions and has not been previously immunized should receive 1 dose of PCV13 vaccine. This PCV13 should be followed with a dose of pneumococcal polysaccharide (PPSV23) vaccine. The PPSV23 vaccine dose should be obtained at least 8 weeks after the dose of PCV13 vaccine. An adult aged 19  years or older who has certain medical conditions and previously received 1 or more doses of PPSV23 vaccine should receive 1 dose of PCV13. The PCV13 vaccine dose should be obtained 1 or more years after the last PPSV23 vaccine dose.  Pneumococcal polysaccharide (PPSV23) vaccine. When PCV13 is also indicated, PCV13 should be obtained first. All adults aged 65 years and older should be immunized. An adult younger than age 65 years who has certain medical conditions should be immunized. Any person who resides in a nursing home or long-term care facility should be immunized. An adult smoker should be immunized. People with an immunocompromised condition and certain other conditions should receive both PCV13 and PPSV23 vaccines. People with human immunodeficiency virus (HIV) infection should be immunized as soon as possible after diagnosis. Immunization during chemotherapy or radiation therapy should be avoided. Routine use of PPSV23 vaccine is not recommended for American Indians, [place] Natives, or people younger than 65 years unless there are medical conditions that require PPSV23 vaccine. When indicated, people who have unknown immunization and have no record of immunization should receive PPSV23 vaccine. One-time revaccination 5 years after the first dose of PPSV23 is recommended for people aged 19-64 years who have chronic kidney failure, nephrotic syndrome, asplenia, or immunocompromised conditions. People who received 1-2 doses of PPSV23 before age 65 years should receive another dose of PPSV23 vaccine at age 65 years or later if at least 5 years have passed since the previous dose. Doses of PPSV23 are not needed for people immunized with PPSV23 at or after age 65 years.  Meningococcal vaccine. Adults with asplenia or persistent complement component deficiencies should receive 2 doses of quadrivalent meningococcal conjugate (MenACWY-D) vaccine. The doses should be obtained at least 2 months apart.  Microbiologists working with certain meningococcal bacteria, military recruits, people at risk during an outbreak, and people who travel to or live in countries with a high rate of meningitis should be immunized. A first-year college student up through age   21 years who is living in a residence hall should receive a dose if she did not receive a dose on or after her 16th birthday. Adults who have certain high-risk conditions should receive one or more doses of vaccine.  Hepatitis A vaccine. Adults who wish to be protected from this disease, have certain high-risk conditions, work with hepatitis A-infected animals, work in hepatitis A research labs, or travel to or work in countries with a high rate of hepatitis A should be immunized. Adults who were previously unvaccinated and who anticipate close contact with an international adoptee during the first 60 days after arrival in the [place] from a country with a high rate of hepatitis A should be immunized.  Hepatitis B vaccine. Adults who wish to be protected from this disease, have certain high-risk conditions, may be exposed to blood or other infectious body fluids, are household contacts or sex partners of hepatitis B positive people, are clients or workers in certain care facilities, or travel to or work in countries with a high rate of hepatitis B should be immunized.  Haemophilus influenzae type b (Hib) vaccine. A previously unvaccinated person with asplenia or sickle cell disease or having a scheduled splenectomy should receive 1 dose of Hib vaccine. Regardless of previous immunization, a recipient of a hematopoietic stem cell transplant should receive a 3-dose series 6-12 months after her successful transplant. Hib vaccine is not recommended for adults with HIV infection. Preventive Services / Frequency Ages 64 to 68 years  Blood pressure check.** / Every 1 to 2 years.  Lipid and cholesterol check.** / Every 5 years beginning at age  22.  Clinical breast exam.** / Every 3 years for women in their 88s and 53s.  BRCA-related cancer risk assessment.** / For women who have family members with a BRCA-related cancer (breast, ovarian, tubal, or peritoneal cancers).  Pap test.** / Every 2 years from ages 90 through 51. Every 3 years starting at age 21 through age 56 or 3 with a history of 3 consecutive normal Pap tests.  HPV screening.** / Every 3 years from ages 24 through ages 1 to 46 with a history of 3 consecutive normal Pap tests.  Hepatitis C blood test.** / For any individual with known risks for hepatitis C.  Skin self-exam. / Monthly.  Influenza vaccine. / Every year.  Tetanus, diphtheria, and acellular pertussis (Tdap, Td) vaccine.** / Consult your health care provider. Pregnant women should receive 1 dose of Tdap vaccine during each pregnancy. 1 dose of Td every 10 years.  Varicella vaccine.** / Consult your health care provider. Pregnant females who do not have evidence of immunity should receive the first dose after pregnancy.  HPV vaccine. / 3 doses over 6 months, if 72 and younger. The vaccine is not recommended for use in pregnant females. However, pregnancy testing is not needed before receiving a dose.  Measles, mumps, rubella (MMR) vaccine.** / You need at least 1 dose of MMR if you were born in 1957 or later. You may also need a 2nd dose. For females of childbearing age, rubella immunity should be determined. If there is no evidence of immunity, females who are not pregnant should be vaccinated. If there is no evidence of immunity, females who are pregnant should delay immunization until after pregnancy.  Pneumococcal 13-valent conjugate (PCV13) vaccine.** / Consult your health care provider.  Pneumococcal polysaccharide (PPSV23) vaccine.** / 1 to 2 doses if you smoke cigarettes or if you have certain conditions.  Meningococcal vaccine.** /  1 dose if you are age 19 to 21 years and a first-year college  student living in a residence hall, or have one of several medical conditions, you need to get vaccinated against meningococcal disease. You may also need additional booster doses.  Hepatitis A vaccine.** / Consult your health care provider.  Hepatitis B vaccine.** / Consult your health care provider.  Haemophilus influenzae type b (Hib) vaccine.** / Consult your health care provider. Ages 40 to 64 years  Blood pressure check.** / Every 1 to 2 years.  Lipid and cholesterol check.** / Every 5 years beginning at age 20 years.  Lung cancer screening. / Every year if you are aged 55-80 years and have a 30-pack-year history of smoking and currently smoke or have quit within the past 15 years. Yearly screening is stopped once you have quit smoking for at least 15 years or develop a health problem that would prevent you from having lung cancer treatment.  Clinical breast exam.** / Every year after age 40 years.  BRCA-related cancer risk assessment.** / For women who have family members with a BRCA-related cancer (breast, ovarian, tubal, or peritoneal cancers).  Mammogram.** / Every year beginning at age 40 years and continuing for as long as you are in good health. Consult with your health care provider.  Pap test.** / Every 3 years starting at age 30 years through age 65 or 70 years with a history of 3 consecutive normal Pap tests.  HPV screening.** / Every 3 years from ages 30 years through ages 65 to 70 years with a history of 3 consecutive normal Pap tests.  Fecal occult blood test (FOBT) of stool. / Every year beginning at age 50 years and continuing until age 75 years. You may not need to do this test if you get a colonoscopy every 10 years.  Flexible sigmoidoscopy or colonoscopy.** / Every 5 years for a flexible sigmoidoscopy or every 10 years for a colonoscopy beginning at age 50 years and continuing until age 75 years.  Hepatitis C blood test.** / For all people born from 1945 through  1965 and any individual with known risks for hepatitis C.  Skin self-exam. / Monthly.  Influenza vaccine. / Every year.  Tetanus, diphtheria, and acellular pertussis (Tdap/Td) vaccine.** / Consult your health care provider. Pregnant women should receive 1 dose of Tdap vaccine during each pregnancy. 1 dose of Td every 10 years.  Varicella vaccine.** / Consult your health care provider. Pregnant females who do not have evidence of immunity should receive the first dose after pregnancy.  Zoster vaccine.** / 1 dose for adults aged 60 years or older.  Measles, mumps, rubella (MMR) vaccine.** / You need at least 1 dose of MMR if you were born in 1957 or later. You may also need a 2nd dose. For females of childbearing age, rubella immunity should be determined. If there is no evidence of immunity, females who are not pregnant should be vaccinated. If there is no evidence of immunity, females who are pregnant should delay immunization until after pregnancy.  Pneumococcal 13-valent conjugate (PCV13) vaccine.** / Consult your health care provider.  Pneumococcal polysaccharide (PPSV23) vaccine.** / 1 to 2 doses if you smoke cigarettes or if you have certain conditions.  Meningococcal vaccine.** / Consult your health care provider.  Hepatitis A vaccine.** / Consult your health care provider.  Hepatitis B vaccine.** / Consult your health care provider.  Haemophilus influenzae type b (Hib) vaccine.** / Consult your health care provider. Ages 65   years and over  Blood pressure check.** / Every 1 to 2 years.  Lipid and cholesterol check.** / Every 5 years beginning at age 45 years.  Lung cancer screening. / Every year if you are aged 51-80 years and have a 30-pack-year history of smoking and currently smoke or have quit within the past 15 years. Yearly screening is stopped once you have quit smoking for at least 15 years or develop a health problem that would prevent you from having lung cancer  treatment.  Clinical breast exam.** / Every year after age 51 years.  BRCA-related cancer risk assessment.** / For women who have family members with a BRCA-related cancer (breast, ovarian, tubal, or peritoneal cancers).  Mammogram.** / Every year beginning at age 14 years and continuing for as long as you are in good health. Consult with your health care provider.  Pap test.** / Every 3 years starting at age 50 years through age 26 or 26 years with 3 consecutive normal Pap tests. Testing can be stopped between 65 and 70 years with 3 consecutive normal Pap tests and no abnormal Pap or HPV tests in the past 10 years.  HPV screening.** / Every 3 years from ages 52 years through ages 11 or 15 years with a history of 3 consecutive normal Pap tests. Testing can be stopped between 65 and 70 years with 3 consecutive normal Pap tests and no abnormal Pap or HPV tests in the past 10 years.  Fecal occult blood test (FOBT) of stool. / Every year beginning at age 48 years and continuing until age 32 years. You may not need to do this test if you get a colonoscopy every 10 years.  Flexible sigmoidoscopy or colonoscopy.** / Every 5 years for a flexible sigmoidoscopy or every 10 years for a colonoscopy beginning at age 45 years and continuing until age 19 years.  Hepatitis C blood test.** / For all people born from [birth year] through 1967 and any individual with known risks for hepatitis C.  Osteoporosis screening.** / A one-time screening for women ages 28 years and over and women at risk for fractures or osteoporosis.  Skin self-exam. / Monthly.  Influenza vaccine. / Every year.  Tetanus, diphtheria, and acellular pertussis (Tdap/Td) vaccine.** / 1 dose of Td every 10 years.  Varicella vaccine.** / Consult your health care provider.  Zoster vaccine.** / 1 dose for adults aged 1 years or older.  Pneumococcal 13-valent conjugate (PCV13) vaccine.** / Consult your health care provider.  Pneumococcal  polysaccharide (PPSV23) vaccine.** / 1 dose for all adults aged 1 years and older.  Meningococcal vaccine.** / Consult your health care provider.  Hepatitis A vaccine.** / Consult your health care provider.  Hepatitis B vaccine.** / Consult your health care provider.  Haemophilus influenzae type b (Hib) vaccine.** / Consult your health care provider. ** Family history and personal history of risk and conditions may change your health care provider's recommendations. Document Released: 11/30/2001 Document Revised: 02/18/2014 Document Reviewed: 03/01/2011 [facility] Patient Information 2015 [place], [place]. This information is not intended to replace advice given to you by your health care provider. Make sure you discuss any questions you have with your health care provider.

## 2014-08-21 LAB — CYTOLOGY - PAP

## 2014-08-27 ENCOUNTER — Other Ambulatory Visit (INDEPENDENT_AMBULATORY_CARE_PROVIDER_SITE_OTHER): Payer: 59

## 2014-08-27 ENCOUNTER — Other Ambulatory Visit: Payer: 59

## 2014-08-27 DIAGNOSIS — E038 Other specified hypothyroidism: Secondary | ICD-10-CM

## 2014-08-27 DIAGNOSIS — M2550 Pain in unspecified joint: Secondary | ICD-10-CM

## 2014-08-27 DIAGNOSIS — M791 Myalgia, unspecified site: Secondary | ICD-10-CM

## 2014-08-27 DIAGNOSIS — I10 Essential (primary) hypertension: Secondary | ICD-10-CM

## 2014-08-27 DIAGNOSIS — R8290 Unspecified abnormal findings in urine: Secondary | ICD-10-CM

## 2014-08-27 DIAGNOSIS — E785 Hyperlipidemia, unspecified: Secondary | ICD-10-CM

## 2014-08-27 DIAGNOSIS — R319 Hematuria, unspecified: Secondary | ICD-10-CM

## 2014-08-27 LAB — POCT URINALYSIS DIPSTICK
Bilirubin, UA: NEGATIVE
Glucose, UA: NEGATIVE
Ketones, UA: NEGATIVE
Leukocytes, UA: NEGATIVE
NITRITE UA: NEGATIVE
PH UA: 6
PROTEIN UA: NEGATIVE
Spec Grav, UA: 1.01
Urobilinogen, UA: 0.2

## 2014-08-27 LAB — HEPATIC FUNCTION PANEL
ALK PHOS: 56 U/L (ref 39–117)
ALT: 18 U/L (ref 0–35)
AST: 23 U/L (ref 0–37)
Albumin: 3.5 g/dL (ref 3.5–5.2)
BILIRUBIN TOTAL: 0.6 mg/dL (ref 0.2–1.2)
Bilirubin, Direct: 0 mg/dL (ref 0.0–0.3)
Total Protein: 7 g/dL (ref 6.0–8.3)

## 2014-08-27 LAB — LIPID PANEL
Cholesterol: 256 mg/dL — ABNORMAL HIGH (ref 0–200)
HDL: 73.7 mg/dL (ref >39.00)
LDL Cholesterol: 173 mg/dL — ABNORMAL HIGH (ref 0–99)
NONHDL: 182.3
Total CHOL/HDL Ratio: 3
Triglycerides: 45 mg/dL (ref 0.0–149.0)
VLDL: 9 mg/dL (ref 0.0–40.0)

## 2014-08-27 LAB — CBC WITH DIFFERENTIAL/PLATELET
BASOS ABS: 0 10*3/uL (ref 0.0–0.1)
Basophils Relative: 0.6 % (ref 0.0–3.0)
Eosinophils Absolute: 0.1 10*3/uL (ref 0.0–0.7)
Eosinophils Relative: 2.8 % (ref 0.0–5.0)
HCT: 35.1 % — ABNORMAL LOW (ref 36.0–46.0)
HEMOGLOBIN: 11.8 g/dL — AB (ref 12.0–15.0)
LYMPHS PCT: 32.5 % (ref 12.0–46.0)
Lymphs Abs: 1.6 10*3/uL (ref 0.7–4.0)
MCHC: 33.7 g/dL (ref 30.0–36.0)
MCV: 92.7 fl (ref 78.0–100.0)
MONOS PCT: 10.9 % (ref 3.0–12.0)
Monocytes Absolute: 0.5 10*3/uL (ref 0.1–1.0)
NEUTROS ABS: 2.6 10*3/uL (ref 1.4–7.7)
Neutrophils Relative %: 53.2 % (ref 43.0–77.0)
PLATELETS: 244 10*3/uL (ref 150.0–400.0)
RBC: 3.79 Mil/uL — ABNORMAL LOW (ref 3.87–5.11)
RDW: 13 % (ref 11.5–15.5)
WBC: 4.8 10*3/uL (ref 4.0–10.5)

## 2014-08-27 LAB — BASIC METABOLIC PANEL
BUN: 22 mg/dL (ref 6–23)
CALCIUM: 9.5 mg/dL (ref 8.4–10.5)
CO2: 24 mEq/L (ref 19–32)
Chloride: 104 mEq/L (ref 96–112)
Creatinine, Ser: 0.6 mg/dL (ref 0.4–1.2)
GFR: 101.08 mL/min (ref >60.00)
GLUCOSE: 84 mg/dL (ref 70–99)
Potassium: 3.9 mEq/L (ref 3.5–5.1)
Sodium: 140 mEq/L (ref 135–145)

## 2014-08-27 LAB — SEDIMENTATION RATE: Sed Rate: 29 mm/hr — ABNORMAL HIGH (ref 0–22)

## 2014-08-27 LAB — TSH: TSH: 3.2 u[IU]/mL (ref 0.35–4.50)

## 2014-08-29 LAB — URINE CULTURE
Colony Count: NO GROWTH
Organism ID, Bacteria: NO GROWTH

## 2014-09-25 ENCOUNTER — Encounter: Payer: Self-pay | Admitting: Cardiology

## 2014-09-25 ENCOUNTER — Ambulatory Visit (INDEPENDENT_AMBULATORY_CARE_PROVIDER_SITE_OTHER): Payer: 59 | Admitting: Cardiology

## 2014-09-25 VITALS — BP 136/70 | HR 60 | Ht 63.5 in | Wt 137.0 lb

## 2014-09-25 DIAGNOSIS — R002 Palpitations: Secondary | ICD-10-CM

## 2014-09-25 DIAGNOSIS — E785 Hyperlipidemia, unspecified: Secondary | ICD-10-CM

## 2014-09-25 MED ORDER — PRAVASTATIN SODIUM 40 MG PO TABS
40.0000 mg | ORAL_TABLET | Freq: Every evening | ORAL | Status: DC
Start: 2014-09-25 — End: 2015-01-14

## 2014-09-25 NOTE — Patient Instructions (Signed)
Increase pravastatin to 40mg  daily in the evening. You can take 2 of your 20mg  tablets daily in the evening and use your current supply.  Take coenzyme Q10 200mg  daily. You can get this without a prescription.  Your physician recommends that you return for a FASTING lipid profile /liver profile in 2 months.  Your physician wants you to follow-up in: 1 year with Dr Aundra Dubin. (December 2016). You will receive a reminder letter in the mail two months in advance. If you don't receive a letter, please call our office to schedule the follow-up appointment.

## 2014-09-26 NOTE — Progress Notes (Signed)
Patient ID: Nancy Cuevas, female   DOB: [birth year], 68 y.o.   MRN: [id number] PCP: Dr. Etter Sjogren  68 yo with history of palpitations and PACs/atrial tachycardia presents for followup.  She saw Richardson Dopp in 8/14 for recurrent palpitations.  She had been on a trip to [place] and had drunk more wine than usual.  She associated this with increased palpitations.  Holter showed frequent PACs, short runs of atrial tachycardia, and one short run of NSVT.  She was started on a low dose of metoprolol, 12.5 mg bid.  Since starting metropolol, she really has noted no more palpitations.  She has been feeling good.  Echo in 9/14 showed EF 55-60% with mild MR.  No exertional dyspnea or chest pain.  No lightheadedness or syncope.   She had some leg aching and stopped pravastatin for a short time, but this did not help so she restarted it.  The pain is more of a shooting neuropathic pain than myalgias.  Last LDL was increased to 173. She had been back on pravastatin for about a month when labs were drawn.   Labs (12/11): LDL 177, HDL 100, Lp(a) < 15  Labs (1/12): K 5.2, creatinine 0.7  Labs (6/13): HDL 96, LDL 113, K 4.7, creatinine 0.6, TSH normal.  Labs (4/14): TSH normal, LDL 98, HDL 85 Labs (8/14): K 4.8, creatinine 0.68 Labs (11/15): K 3.9, creatinine 0.6, HCT 35.1, LDL 173  ECG: NSR, normal  Allergies:  1) ! Sulfa   Past Medical History:  1. Hypothyroidism  2. Osteopenia  3. Palpitations: Echo (1/12) EF 54-27%, mild diastolic dysfunction, atrial septal aneurysm. Holter (1/12): short runs of atrial tachycardia versus MAT.  Holter (8/14) with PACs, short runs of atrial tachycardia, short run of NSVT.  Echo (9/14) with EF 55-60%, mild MR.  4. Hyperlipidemia   Family History:   Mother : deceased 29 yo  FATHER:DECEASED  1 BROTHER:LIVING  HEART: FATHER-OPEN-HEART, UNCLES (MOTHER'S SIDE) , MOTHER, MGM,MGF,PGF  Family History Breast cancer 1st degree relative <50: 2 AUNTS (71 SISTER)  Family History  Lung cancer: AUNT-MOTHER'S SIDE  Family History Diabetes 1st degree relative: MOTHER, 2 AUNTS (MOTHER'S SIDE)  Family History Hypertension: MOTHER  Family History High cholesterol:MOTHER  Family History Thyroid disease:MOTHER  Family History Osteoporosis: AUNT 38 SIDE  M-- dementia  Family History of Stroke F 1st degree relative   Social History:  Conservation officer, [employer]  Married  Never Smoked  Alcohol use-yes  Drug use-no  Regular exercise-yes   Current Outpatient Prescriptions  Medication Sig Dispense Refill  . aspirin 81 MG tablet Take 81 mg by mouth every evening.      . Calcium Carbonate-Vitamin D (CALCIUM 600 + D PO) Take 3 tablets by mouth daily.     . Flaxseed, Linseed, (FLAX SEED OIL) 1000 MG CAPS Take 1 capsule by mouth daily. TAKE ONE TAB TWICE A DAY    . IRON, FERROUS GLUCONATE, PO Take 1 capsule by mouth daily.    Marland Kitchen levothyroxine (SYNTHROID, LEVOTHROID) 50 MCG tablet take 1 tablet by mouth once daily 30 tablet 11  . metoprolol tartrate (LOPRESSOR) 25 MG tablet take 1/2 (12.5 MG) tablets by mouth twice a day OK TO TAKE EXTRA 1/2 AS NEEDED FOR PALPITATIONS 45 tablet 1  . Multiple Vitamin (MULTIVITAMIN) tablet Take 1 tablet by mouth daily.      . Omega-3 Fatty Acids (FISH OIL) 1000 MG CAPS Take 1 capsule by mouth daily. TAKE ONE TAB TWICE A DAY    .  Psyllium 30.9 % POWD as needed. Take a fiber supplement daily    . Coenzyme Q10 200 MG TABS Take 1 tablet (200 mg total) by mouth daily.    . pravastatin (PRAVACHOL) 40 MG tablet Take 1 tablet (40 mg total) by mouth every evening. 30 tablet 3   No current facility-administered medications for this visit.    BP 136/70 mmHg  Pulse 60  Ht 5' 3.5" (1.613 m)  Wt 137 lb (62.143 kg)  BMI 23.88 kg/m2 General: NAD Neck: No JVD, no thyromegaly or thyroid nodule.  Lungs: Clear to auscultation bilaterally with normal respiratory effort. CV: Nondisplaced PMI.  Heart regular S1/S2, no S3/S4, no murmur.  No peripheral  edema.  No carotid bruit.  Normal pedal pulses.  Abdomen: Soft, nontender, no hepatosplenomegaly, no distention.  Neurologic: Alert and oriented x 3.  Psych: Normal affect. Extremities: No clubbing or cyanosis.   Assessment/Plan: 1. Palpitations: Frequent PACs and short runs of atrial tachycardia on last holter monitor.  Significant symptomatic improvement on beta blocker.  Currently, not feeling palpitations.  She is likely at increased risk for atrial fibrillation down the road.  Continue metoprolol.  2.  Hyperlipidemia: LDL was too high in 11/15.  She had only been on pravastatin for about a month though.  I will have her increase pravastatin to 40 mg daily with lipids/LFTs in 2 months.  She can take coenzyme Q10 to try to help with any myalgias. 3. Continue ASA 81 mg daily given family history.   Loralie Champagne 09/26/2014

## 2014-10-18 HISTORY — PX: FOOT SURGERY: SHX648

## 2014-10-18 HISTORY — PX: BUNIONECTOMY: SHX129

## 2014-11-08 ENCOUNTER — Other Ambulatory Visit: Payer: Self-pay | Admitting: Cardiology

## 2014-12-09 ENCOUNTER — Other Ambulatory Visit (INDEPENDENT_AMBULATORY_CARE_PROVIDER_SITE_OTHER): Payer: Medicare Other | Admitting: *Deleted

## 2014-12-09 DIAGNOSIS — E785 Hyperlipidemia, unspecified: Secondary | ICD-10-CM

## 2014-12-09 LAB — HEPATIC FUNCTION PANEL
ALK PHOS: 63 U/L (ref 39–117)
ALT: 21 U/L (ref 0–35)
AST: 23 U/L (ref 0–37)
Albumin: 4.3 g/dL (ref 3.5–5.2)
BILIRUBIN DIRECT: 0.1 mg/dL (ref 0.0–0.3)
BILIRUBIN TOTAL: 0.6 mg/dL (ref 0.2–1.2)
Total Protein: 7.1 g/dL (ref 6.0–8.3)

## 2014-12-09 LAB — LIPID PANEL
Cholesterol: 195 mg/dL (ref 0–200)
HDL: 89.9 mg/dL (ref >39.00)
LDL CALC: 96 mg/dL (ref 0–99)
NONHDL: 105.1
TRIGLYCERIDES: 46 mg/dL (ref 0.0–149.0)
Total CHOL/HDL Ratio: 2
VLDL: 9.2 mg/dL (ref 0.0–40.0)

## 2014-12-11 ENCOUNTER — Telehealth: Payer: Self-pay | Admitting: Cardiology

## 2014-12-11 NOTE — Telephone Encounter (Signed)
LMTCB

## 2014-12-11 NOTE — Telephone Encounter (Signed)
New MEssage  Pt returning rn phone call. Please call back and discuss.

## 2014-12-11 NOTE — Telephone Encounter (Signed)
Spoke with patient about recent lab results 

## 2015-01-13 ENCOUNTER — Other Ambulatory Visit: Payer: Self-pay

## 2015-01-13 DIAGNOSIS — E039 Hypothyroidism, unspecified: Secondary | ICD-10-CM

## 2015-01-13 MED ORDER — LEVOTHYROXINE SODIUM 50 MCG PO TABS: ORAL_TABLET | ORAL | Status: DC

## 2015-01-14 ENCOUNTER — Other Ambulatory Visit: Payer: Self-pay | Admitting: *Deleted

## 2015-01-14 DIAGNOSIS — E785 Hyperlipidemia, unspecified: Secondary | ICD-10-CM

## 2015-01-14 MED ORDER — PRAVASTATIN SODIUM 40 MG PO TABS: 40.0000 mg | ORAL_TABLET | Freq: Every evening | ORAL | Status: DC

## 2015-01-21 ENCOUNTER — Other Ambulatory Visit: Payer: Self-pay | Admitting: *Deleted

## 2015-01-21 DIAGNOSIS — E785 Hyperlipidemia, unspecified: Secondary | ICD-10-CM

## 2015-01-21 MED ORDER — PRAVASTATIN SODIUM 40 MG PO TABS: 40.0000 mg | ORAL_TABLET | Freq: Every evening | ORAL | Status: DC

## 2015-02-21 ENCOUNTER — Encounter: Payer: Self-pay | Admitting: Family Medicine

## 2015-02-21 ENCOUNTER — Ambulatory Visit (INDEPENDENT_AMBULATORY_CARE_PROVIDER_SITE_OTHER): Payer: Medicare Other | Admitting: Family Medicine

## 2015-02-21 VITALS — BP 141/69 | HR 51 | Temp 98.3°F | Wt 137.4 lb

## 2015-02-21 DIAGNOSIS — E039 Hypothyroidism, unspecified: Secondary | ICD-10-CM | POA: Diagnosis not present

## 2015-02-21 DIAGNOSIS — R002 Palpitations: Secondary | ICD-10-CM | POA: Diagnosis not present

## 2015-02-21 DIAGNOSIS — E785 Hyperlipidemia, unspecified: Secondary | ICD-10-CM

## 2015-02-21 NOTE — Assessment & Plan Note (Signed)
Lab Results  Component Value Date   CHOL 195 12/09/2014   HDL 89.90 12/09/2014   LDLCALC 96 12/09/2014   LDLDIRECT 112.9 03/23/2012   TRIG 46.0 12/09/2014   CHOLHDL 2 12/09/2014

## 2015-02-21 NOTE — Assessment & Plan Note (Signed)
Resolved with meds

## 2015-02-21 NOTE — Progress Notes (Signed)
Pre visit review using our clinic review tool, if applicable. No additional management support is needed unless otherwise documented below in the visit note. 

## 2015-02-21 NOTE — Progress Notes (Signed)
Subjective:    Patient ID: Nancy Cuevas, female    DOB: 05-Feb-1946, 69 y.o.   MRN: [id number]  HPI  Patient here for f/u htn and cholesterol.  Myalgias have resolved since foot surgery.  Past Medical History  Diagnosis Date  . POSTMENOPAUSAL STATUS 02/07/2008  . OSTEOPENIA 03/24/2008  . HYPOTHYROIDISM 07/18/2007  . HYPERLIPIDEMIA 10/27/2010  . Bunion   . Contact dermatitis and other eczema, due to unspecified cause   . Nonspecific (abnormal) findings on radiological and other examination of other intrathoracic organs   . Contact dermatitis and other eczema, due to unspecified cause   . Female stress incontinence   . Hyperpotassemia   . Disorder of bone and cartilage, unspecified   . Palpitations     Echo (1/12) EF 58-83%, mild diastolic dysfunction, atrial septal aneurysm. Holter (1/12): short runs of atrial tachycardia versus MAT. ;  Echo (14) EF 55-60%, normal wall motion, mild MR, mild LAE, atrial septal aneurysm  . Symptomatic menopausal or female climacteric states   . 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 Personal history of colonic and rectal adenomas 03/22/2013    Review of Systems  Constitutional: Negative for diaphoresis, appetite change, fatigue and unexpected weight change.  Eyes: Negative for pain, redness and visual disturbance.  Respiratory: Negative for cough, chest tightness, shortness of breath and wheezing.   Cardiovascular: Negative for chest pain, palpitations and leg swelling.  Endocrine: Negative for cold intolerance, heat intolerance, polydipsia, polyphagia and polyuria.  Genitourinary: Negative for dysuria, frequency and difficulty urinating.  Neurological: Negative for dizziness, light-headedness, numbness and headaches.    Current Outpatient Prescriptions on File Prior to Visit  Medication Sig Dispense Refill  . aspirin 81 MG tablet Take 81 mg by mouth every evening.      . Calcium Carbonate-Vitamin D (CALCIUM 600 + D PO) Take 3 tablets  by mouth daily.     . Flaxseed, Linseed, (FLAX SEED OIL) 1000 MG CAPS Take 1 capsule by mouth daily. TAKE ONE TAB TWICE A DAY    . IRON, FERROUS GLUCONATE, PO Take 1 capsule by mouth daily.    Marland Kitchen levothyroxine (SYNTHROID, LEVOTHROID) 50 MCG tablet take 1 tablet by mouth once daily 90 tablet 3  . metoprolol tartrate (LOPRESSOR) 25 MG tablet take 1/2 tablets by mouth twice a day MAY TAKE AN EXTRA 1/2 TABLET FOR PALPITATIONS IF NEEDED 45 tablet 4  . Multiple Vitamin (MULTIVITAMIN) tablet Take 1 tablet by mouth daily.      . Omega-3 Fatty Acids (FISH OIL) 1000 MG CAPS Take 1 capsule by mouth daily. TAKE ONE TAB TWICE A DAY    . pravastatin (PRAVACHOL) 40 MG tablet Take 1 tablet (40 mg total) by mouth every evening. 90 tablet 2  . Psyllium 30.9 % POWD as needed. Take a fiber supplement daily     No current facility-administered medications on file prior to visit.       Objective:    Physical Exam  Constitutional: She is oriented to person, place, and time. She appears well-developed and well-nourished. No distress.  HENT:  Right Ear: External ear normal.  Left Ear: External ear normal.  Nose: Nose normal.  Mouth/Throat: Oropharynx is clear and moist.  Eyes: EOM are normal. Pupils are equal, round, and reactive to light.  Neck: Normal range of motion. Neck supple.  Cardiovascular: Normal rate, regular rhythm and normal heart sounds.   No murmur heard. Pulmonary/Chest: Effort normal and breath sounds normal. No respiratory distress.  She has no wheezes. She has no rales. She exhibits no tenderness.  Neurological: She is alert and oriented to person, place, and time.  Psychiatric: She has a normal mood and affect. Her behavior is normal. Judgment and thought content normal.    BP 141/69 mmHg  Pulse 51  Temp(Src) 98.3 F (36.8 C) (Oral)  Wt 137 lb 6.4 oz (62.324 kg)  SpO2 98% Wt Readings from Last 3 Encounters:  02/21/15 137 lb 6.4 oz (62.324 kg)  09/25/14 137 lb (62.143 kg)  08/20/14  136 lb (61.689 kg)     Lab Results  Component Value Date   WBC 4.8 08/27/2014   HGB 11.8* 08/27/2014   HCT 35.1* 08/27/2014   PLT 244.0 08/27/2014   GLUCOSE 84 08/27/2014   CHOL 195 12/09/2014   TRIG 46.0 12/09/2014   HDL 89.90 12/09/2014   LDLDIRECT 112.9 03/23/2012   LDLCALC 96 12/09/2014   ALT 21 12/09/2014   AST 23 12/09/2014   NA 140 08/27/2014   K 3.9 08/27/2014   CL 104 08/27/2014   CREATININE 0.6 08/27/2014   BUN 22 08/27/2014   CO2 24 08/27/2014   TSH 3.20 08/27/2014       Assessment & Plan:   Problem List Items Addressed This Visit    PALPITATIONS, OCCASIONAL    Resolved with meds       Hyperlipidemia    Lab Results  Component Value Date   CHOL 195 12/09/2014   HDL 89.90 12/09/2014   LDLCALC 96 12/09/2014   LDLDIRECT 112.9 03/23/2012   TRIG 46.0 12/09/2014   CHOLHDL 2 12/09/2014          Other Visit Diagnoses    Hypothyroidism, unspecified hypothyroidism type    -  Primary       I have discontinued Ms. Ospina's Coenzyme Q10. I am also having her maintain her multivitamin, Flax Seed Oil, Calcium Carbonate-Vitamin D (CALCIUM 600 + D PO), aspirin, Fish Oil, (IRON, FERROUS GLUCONATE, PO), Psyllium, metoprolol tartrate, levothyroxine, and pravastatin.  No orders of the defined types were placed in this encounter.     Garnet Koyanagi, DO

## 2015-02-21 NOTE — Patient Instructions (Signed)

## 2015-04-14 ENCOUNTER — Other Ambulatory Visit: Payer: Self-pay

## 2015-04-14 DIAGNOSIS — Z1231 Encounter for screening mammogram for malignant neoplasm of breast: Secondary | ICD-10-CM

## 2015-04-17 ENCOUNTER — Ambulatory Visit: Payer: Medicare Other

## 2015-04-24 ENCOUNTER — Ambulatory Visit
Admission: RE | Admit: 2015-04-24 | Discharge: 2015-04-24 | Disposition: A | Discharge status: Home / Self Care | Payer: Medicare Other | Source: Ambulatory Visit

## 2015-04-24 DIAGNOSIS — Z1231 Encounter for screening mammogram for malignant neoplasm of breast: Secondary | ICD-10-CM

## 2015-06-13 ENCOUNTER — Other Ambulatory Visit: Payer: Self-pay | Admitting: Cardiology

## 2015-07-03 ENCOUNTER — Telehealth: Payer: Self-pay | Admitting: Family Medicine

## 2015-07-03 NOTE — Telephone Encounter (Signed)
FYI- Patient had to [facility] her 6 month follow up to 10/06/2015 with PCP. Noticed the notes stated medicare wellness with ashlee after office visit.

## 2015-07-08 NOTE — Telephone Encounter (Signed)
Noted! Thank you

## 2015-08-25 ENCOUNTER — Ambulatory Visit: Payer: Medicare Other | Admitting: Family Medicine

## 2015-09-15 ENCOUNTER — Encounter: Payer: Self-pay | Admitting: Physician Assistant

## 2015-09-15 ENCOUNTER — Ambulatory Visit (INDEPENDENT_AMBULATORY_CARE_PROVIDER_SITE_OTHER): Payer: Medicare Other | Admitting: Physician Assistant

## 2015-09-15 VITALS — BP 131/62 | HR 55 | Temp 97.7°F | Resp 16 | Ht 63.5 in | Wt 143.0 lb

## 2015-09-15 DIAGNOSIS — J209 Acute bronchitis, unspecified: Secondary | ICD-10-CM | POA: Diagnosis not present

## 2015-09-15 MED ORDER — AZITHROMYCIN 250 MG PO TABS: ORAL_TABLET | ORAL | Status: DC

## 2015-09-15 NOTE — Patient Instructions (Signed)
Take antibiotic (Azithromycin) as directed.  Increase fluids.  Get plenty of rest. Use Mucinex-DM twice daily. Take a daily probiotic (I recommend Align or Culturelle, but even Activia Yogurt may be beneficial).  A humidifier placed in the bedroom may offer some relief for a dry, scratchy throat of nasal irritation.  Read information below on acute bronchitis. Please call or return to clinic if symptoms are not improving.  Acute Bronchitis Bronchitis is when the airways that extend from the windpipe into the lungs get red, puffy, and painful (inflamed). Bronchitis often causes thick spit (mucus) to develop. This leads to a cough. A cough is the most common symptom of bronchitis. In acute bronchitis, the condition usually begins suddenly and goes away over time (usually in 2 weeks). Smoking, allergies, and asthma can make bronchitis worse. Repeated episodes of bronchitis may cause more lung problems.  HOME CARE  Rest.  Drink enough fluids to keep your pee (urine) clear or pale yellow (unless you need to limit fluids as told by your doctor).  Only take over-the-counter or prescription medicines as told by your doctor.  Avoid smoking and secondhand smoke. These can make bronchitis worse. If you are a smoker, think about using nicotine gum or skin patches. Quitting smoking will help your lungs heal faster.  Reduce the chance of getting bronchitis again by:  Washing your hands often.  Avoiding people with cold symptoms.  Trying not to touch your hands to your mouth, nose, or eyes.  Follow up with your doctor as told.  GET HELP IF: Your symptoms do not improve after 1 week of treatment. Symptoms include:  Cough.  Fever.  Coughing up thick spit.  Body aches.  Chest congestion.  Chills.  Shortness of breath.  Sore throat.  GET HELP RIGHT AWAY IF:   You have an increased fever.  You have chills.  You have severe shortness of breath.  You have bloody thick spit  (sputum).  You throw up (vomit) often.  You lose too much body fluid (dehydration).  You have a severe headache.  You faint.  MAKE SURE YOU:   Understand these instructions.  Will watch your condition.  Will get help right away if you are not doing well or get worse. Document Released: 03/22/2008 Document Revised: 06/06/2013 Document Reviewed: 03/27/2013 [facility] Patient Information 2015 [place], [place]. This information is not intended to replace advice given to you by your health care provider. Make sure you discuss any questions you have with your health care provider.

## 2015-09-15 NOTE — Progress Notes (Signed)
Pre visit review using our clinic review tool, if applicable. No additional management support is needed unless otherwise documented below in the visit note/SLS  

## 2015-09-15 NOTE — Progress Notes (Signed)
  Subjective:     Nancy Cuevas is a 69 y.o. female who presents for evaluation of symptoms of a URI. Symptoms include congestion, cough described as productive of green sputum, nasal congestion, no  fever and sore throat. Onset of symptoms was 3 weeks ago, and has been unchanged since that time. Treatment to date: antihistamines, cough suppressants and decongestants.  The following portions of the patient's history were reviewed and updated as appropriate: allergies, current medications, past family history, past medical history, past social history, past surgical history and problem list.  Review of Systems Pertinent items are noted in HPI.   Objective:    BP 131/62 mmHg  Pulse 55  Temp(Src) 97.7 F (36.5 C) (Oral)  Resp 16  Ht 5' 3.5" (1.613 m)  Wt 143 lb (64.864 kg)  BMI 24.93 kg/m2  SpO2 99%   General appearance: alert, cooperative, appears stated age and no distress Head: Normocephalic, without obvious abnormality, atraumatic Ears: normal TM's and external ear canals both ears Nose: Nares normal. Septum midline. Mucosa normal. No drainage or sinus tenderness. Throat: lips, mucosa, and tongue normal; teeth and gums normal Lungs: clear to auscultation bilaterally Heart: regular rate and rhythm, S1, S2 normal, no murmur, click, rub or gallop   Assessment:    bronchitis   Plan:    Discussed diagnosis and treatment of URI. Suggested symptomatic OTC remedies. Nasal saline spray for congestion. Zithromax per orders. Follow up as needed.

## 2015-10-06 ENCOUNTER — Ambulatory Visit: Payer: Medicare Other | Admitting: Family Medicine

## 2015-10-14 ENCOUNTER — Ambulatory Visit: Payer: Medicare Other | Admitting: Family Medicine

## 2015-10-15 ENCOUNTER — Telehealth: Payer: Self-pay | Admitting: Family Medicine

## 2015-10-15 NOTE — Telephone Encounter (Signed)
patient no show appointment 10/14/15 - charge or no charge

## 2015-10-16 NOTE — Telephone Encounter (Signed)
charge 

## 2015-10-24 ENCOUNTER — Encounter: Payer: Self-pay | Admitting: Family Medicine

## 2015-10-24 ENCOUNTER — Ambulatory Visit (INDEPENDENT_AMBULATORY_CARE_PROVIDER_SITE_OTHER): Payer: Medicare Other | Admitting: Family Medicine

## 2015-10-24 VITALS — BP 120/74 | HR 72 | Temp 97.5°F | Wt 144.0 lb

## 2015-10-24 DIAGNOSIS — E038 Other specified hypothyroidism: Secondary | ICD-10-CM

## 2015-10-24 DIAGNOSIS — D509 Iron deficiency anemia, unspecified: Secondary | ICD-10-CM | POA: Diagnosis not present

## 2015-10-24 DIAGNOSIS — I10 Essential (primary) hypertension: Secondary | ICD-10-CM | POA: Diagnosis not present

## 2015-10-24 DIAGNOSIS — E785 Hyperlipidemia, unspecified: Secondary | ICD-10-CM | POA: Diagnosis not present

## 2015-10-24 DIAGNOSIS — E039 Hypothyroidism, unspecified: Secondary | ICD-10-CM

## 2015-10-24 DIAGNOSIS — R002 Palpitations: Secondary | ICD-10-CM

## 2015-10-24 DIAGNOSIS — Z1159 Encounter for screening for other viral diseases: Secondary | ICD-10-CM

## 2015-10-24 LAB — CBC WITH DIFFERENTIAL/PLATELET
BASOS ABS: 0 10*3/uL (ref 0.0–0.1)
BASOS PCT: 0.5 % (ref 0.0–3.0)
EOS ABS: 0.1 10*3/uL (ref 0.0–0.7)
Eosinophils Relative: 2.6 % (ref 0.0–5.0)
HCT: 39.1 % (ref 36.0–46.0)
HEMOGLOBIN: 13.1 g/dL (ref 12.0–15.0)
LYMPHS ABS: 1.6 10*3/uL (ref 0.7–4.0)
Lymphocytes Relative: 31.4 % (ref 12.0–46.0)
MCHC: 33.5 g/dL (ref 30.0–36.0)
MCV: 93.7 fl (ref 78.0–100.0)
MONO ABS: 0.5 10*3/uL (ref 0.1–1.0)
Monocytes Relative: 9.9 % (ref 3.0–12.0)
NEUTROS ABS: 2.8 10*3/uL (ref 1.4–7.7)
NEUTROS PCT: 55.6 % (ref 43.0–77.0)
Platelets: 274 10*3/uL (ref 150.0–400.0)
RBC: 4.17 Mil/uL (ref 3.87–5.11)
RDW: 13.6 % (ref 11.5–15.5)
WBC: 5.1 10*3/uL (ref 4.0–10.5)

## 2015-10-24 LAB — COMPREHENSIVE METABOLIC PANEL
ALBUMIN: 4.7 g/dL (ref 3.5–5.2)
ALT: 27 U/L (ref 0–35)
AST: 32 U/L (ref 0–37)
Alkaline Phosphatase: 68 U/L (ref 39–117)
BUN: 17 mg/dL (ref 6–23)
CHLORIDE: 102 meq/L (ref 96–112)
CO2: 31 mEq/L (ref 19–32)
Calcium: 10.2 mg/dL (ref 8.4–10.5)
Creatinine, Ser: 0.68 mg/dL (ref 0.40–1.20)
GFR: 90.56 mL/min (ref >60.00)
Glucose, Bld: 92 mg/dL (ref 70–99)
POTASSIUM: 4 meq/L (ref 3.5–5.1)
SODIUM: 140 meq/L (ref 135–145)
Total Bilirubin: 0.6 mg/dL (ref 0.2–1.2)
Total Protein: 7.7 g/dL (ref 6.0–8.3)

## 2015-10-24 LAB — LIPID PANEL
CHOLESTEROL: 217 mg/dL — AB (ref 0–200)
HDL: 97.8 mg/dL (ref >39.00)
LDL CALC: 108 mg/dL — AB (ref 0–99)
NonHDL: 119.52
TRIGLYCERIDES: 60 mg/dL (ref 0.0–149.0)
Total CHOL/HDL Ratio: 2
VLDL: 12 mg/dL (ref 0.0–40.0)

## 2015-10-24 LAB — TSH: TSH: 4.48 u[IU]/mL (ref 0.35–4.50)

## 2015-10-24 MED ORDER — PRAVASTATIN SODIUM 40 MG PO TABS: 40.0000 mg | ORAL_TABLET | Freq: Every evening | ORAL | Status: DC

## 2015-10-24 MED ORDER — LEVOTHYROXINE SODIUM 50 MCG PO TABS: ORAL_TABLET | ORAL | Status: DC

## 2015-10-24 MED ORDER — METOPROLOL TARTRATE 25 MG PO TABS: ORAL_TABLET | ORAL | Status: DC

## 2015-10-24 NOTE — Patient Instructions (Signed)
Hypertension Hypertension, commonly called high blood pressure, is when the force of blood pumping through your arteries is too strong. Your arteries are the blood vessels that carry blood from your heart throughout your body. A blood pressure reading consists of a higher number over a lower number, such as 110/72. The higher number (systolic) is the pressure inside your arteries when your heart pumps. The lower number (diastolic) is the pressure inside your arteries when your heart relaxes. Ideally you want your blood pressure below 120/80. Hypertension forces your heart to work harder to pump blood. Your arteries may become narrow or stiff. Having untreated or uncontrolled hypertension can cause heart attack, stroke, kidney disease, and other problems. RISK FACTORS Some risk factors for high blood pressure are controllable. Others are not.  Risk factors you cannot control include:   Race. You may be at higher risk if you are African American.  Age. Risk increases with age.  Gender. Men are at higher risk than women before age 45 years. After age 65, women are at higher risk than men. Risk factors you can control include:  Not getting enough exercise or physical activity.  Being overweight.  Getting too much fat, sugar, calories, or salt in your diet.  Drinking too much alcohol. SIGNS AND SYMPTOMS Hypertension does not usually cause signs or symptoms. Extremely high blood pressure (hypertensive crisis) may cause headache, anxiety, shortness of breath, and nosebleed. DIAGNOSIS To check if you have hypertension, your health care provider will measure your blood pressure while you are seated, with your arm held at the level of your heart. It should be measured at least twice using the same arm. Certain conditions can cause a difference in blood pressure between your right and left arms. A blood pressure reading that is higher than normal on one occasion does not mean that you need treatment. If  it is not clear whether you have high blood pressure, you may be asked to return on a different day to have your blood pressure checked again. Or, you may be asked to monitor your blood pressure at home for 1 or more weeks. TREATMENT Treating high blood pressure includes making lifestyle changes and possibly taking medicine. Living a healthy lifestyle can help lower high blood pressure. You may need to change some of your habits. Lifestyle changes may include:  Following the DASH diet. This diet is high in fruits, vegetables, and whole grains. It is low in salt, red meat, and added sugars.  Keep your sodium intake below 2,300 mg per day.  Getting at least 30-45 minutes of aerobic exercise at least 4 times per week.  Losing weight if necessary.  Not smoking.  Limiting alcoholic beverages.  Learning ways to reduce stress. Your health care provider may prescribe medicine if lifestyle changes are not enough to get your blood pressure under control, and if one of the following is true:  You are 18-59 years of age and your systolic blood pressure is above 140.  You are 60 years of age or older, and your systolic blood pressure is above 150.  Your diastolic blood pressure is above 90.  You have diabetes, and your systolic blood pressure is over 140 or your diastolic blood pressure is over 90.  You have kidney disease and your blood pressure is above 140/90.  You have heart disease and your blood pressure is above 140/90. Your personal target blood pressure may vary depending on your medical conditions, your age, and other factors. HOME CARE INSTRUCTIONS    Have your blood pressure rechecked as directed by your health care provider.   Take medicines only as directed by your health care provider. Follow the directions carefully. Blood pressure medicines must be taken as prescribed. The medicine does not work as well when you skip doses. Skipping doses also puts you at risk for  problems.  Do not smoke.   Monitor your blood pressure at home as directed by your health care provider. SEEK MEDICAL CARE IF:   You think you are having a reaction to medicines taken.  You have recurrent headaches or feel dizzy.  You have swelling in your ankles.  You have trouble with your vision. SEEK IMMEDIATE MEDICAL CARE IF:  You develop a severe headache or confusion.  You have unusual weakness, numbness, or feel faint.  You have severe chest or abdominal pain.  You vomit repeatedly.  You have trouble breathing. MAKE SURE YOU:   Understand these instructions.  Will watch your condition.  Will get help right away if you are not doing well or get worse.   This information is not intended to replace advice given to you by your health care provider. Make sure you discuss any questions you have with your health care provider.   Document Released: 10/04/2005 Document Revised: 02/18/2015 Document Reviewed: 07/27/2013 Elsevier Interactive Patient Education 2016 [employer]  

## 2015-10-24 NOTE — Assessment & Plan Note (Signed)
Check labs today con't pravastatin

## 2015-10-24 NOTE — Progress Notes (Signed)
Pre visit review using our clinic review tool, if applicable. No additional management support is needed unless otherwise documented below in the visit note. 

## 2015-10-24 NOTE — Assessment & Plan Note (Signed)
con't metoprolol F/u cardiology

## 2015-10-24 NOTE — Progress Notes (Signed)
Patient ID: Nancy Cuevas, female    DOB: 1946-06-19  Age: 70 y.o. MRN: [id number]    Subjective:  Subjective HPI DALLY OSHEL presents for palpitations, hyperlipidemia, and thyroid.  No complaints.    Review of Systems  Constitutional: Negative for diaphoresis, appetite change, fatigue and unexpected weight change.  Eyes: Negative for pain, redness and visual disturbance.  Respiratory: Negative for cough, chest tightness, shortness of breath and wheezing.   Cardiovascular: Negative for chest pain, palpitations and leg swelling.  Endocrine: Negative for cold intolerance, heat intolerance, polydipsia, polyphagia and polyuria.  Genitourinary: Negative for dysuria, frequency and difficulty urinating.  Neurological: Negative for dizziness, light-headedness, numbness and headaches.    History Past Medical History  Diagnosis Date  . POSTMENOPAUSAL STATUS 02/07/2008  . OSTEOPENIA 03/24/2008  . HYPOTHYROIDISM 07/18/2007  . HYPERLIPIDEMIA 10/27/2010  . Bunion   . Contact dermatitis and other eczema, due to unspecified cause   . Nonspecific (abnormal) findings on radiological and other examination of other intrathoracic organs   . Contact dermatitis and other eczema, due to unspecified cause   . Female stress incontinence   . Hyperpotassemia   . Disorder of bone and cartilage, unspecified   . Palpitations     Echo (1/12) EF 10-17%, mild diastolic dysfunction, atrial septal aneurysm. Holter (1/12): short runs of atrial tachycardia versus MAT. ;  Echo (14) EF 55-60%, normal wall motion, mild MR, mild LAE, atrial septal aneurysm  . Symptomatic menopausal or female climacteric states   . 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   Her family history includes Cancer in her maternal aunt; Diabetes in her mother; Heart disease in her father; Hypertension in her mother; Stroke in her mother. There is no history of Colon cancer.She reports that she has never smoked. She has never used smokeless tobacco. She reports that she drinks about 1.8 oz of alcohol per week. She reports that she does not use illicit drugs.  Current Outpatient Prescriptions on File Prior to Visit  Medication Sig Dispense Refill  . aspirin 81 MG tablet Take 81 mg by mouth every evening.      . Calcium Carbonate-Vitamin D (CALCIUM 600 + D PO) Take 3 tablets by mouth daily.     . Flaxseed, Linseed, (FLAX SEED OIL) 1000 MG CAPS Take 1 capsule by mouth daily. TAKE ONE TAB TWICE A DAY    . IRON, FERROUS GLUCONATE, PO Take 1 capsule by mouth daily.    . Multiple Vitamin (MULTIVITAMIN) tablet Take 1 tablet by mouth daily.      . Omega-3 Fatty Acids (FISH OIL) 1000 MG CAPS Take 1 capsule by mouth daily. TAKE ONE TAB TWICE A DAY    . Psyllium 30.9 % POWD as needed. Take a fiber supplement daily     No current facility-administered medications on file prior to visit.     Objective:  Objective Physical Exam  Constitutional: She is oriented to person, place, and time. She appears well-developed and well-nourished.  HENT:  Head: Normocephalic and atraumatic.  Eyes: Conjunctivae and EOM are normal.  Neck: Normal range of motion. Neck supple. No JVD  present. Carotid bruit is not present. No thyromegaly present.  Cardiovascular: Normal rate, regular rhythm and normal heart sounds.   No murmur heard. Pulmonary/Chest: Effort normal and breath sounds normal. No respiratory distress. She has no wheezes. She has no rales. She exhibits no tenderness.  Musculoskeletal: She exhibits no  edema.  Neurological: She is alert and oriented to person, place, and time.  Psychiatric: She has a normal mood and affect. Her behavior is normal.  Nursing note and vitals reviewed.  BP 120/74 mmHg  Pulse 72  Temp(Src) 97.5 F (36.4 C) (Oral)  Wt 144 lb (65.318 kg)  SpO2 97% Wt Readings from Last 3 Encounters:  10/24/15 144 lb (65.318 kg)  09/15/15 143 lb (64.864 kg)  02/21/15 137 lb 6.4 oz (62.324 kg)     Lab Results  Component Value Date   WBC 4.8 08/27/2014   HGB 11.8* 08/27/2014   HCT 35.1* 08/27/2014   PLT 244.0 08/27/2014   GLUCOSE 84 08/27/2014   CHOL 195 12/09/2014   TRIG 46.0 12/09/2014   HDL 89.90 12/09/2014   LDLDIRECT 112.9 03/23/2012   LDLCALC 96 12/09/2014   ALT 21 12/09/2014   AST 23 12/09/2014   NA 140 08/27/2014   K 3.9 08/27/2014   CL 104 08/27/2014   CREATININE 0.6 08/27/2014   BUN 22 08/27/2014   CO2 24 08/27/2014   TSH 3.20 08/27/2014    Mm Digital Screening Bilateral  04/24/2015  CLINICAL DATA:  Screening. EXAM: DIGITAL SCREENING BILATERAL MAMMOGRAM WITH CAD COMPARISON:  Previous exam(s). ACR Breast Density Category b: There are scattered areas of fibroglandular density. FINDINGS: There are no findings suspicious for malignancy. Images were processed with CAD. IMPRESSION: No mammographic evidence of malignancy. A result letter of this screening mammogram will be mailed directly to the patient. RECOMMENDATION: Screening mammogram in one year. (Code:SM-B-01Y) BI-RADS CATEGORY  1: Negative. Electronically Signed   By: Conchita Paris M.D.   On: 04/24/2015 16:53     Assessment & Plan:  Plan I have discontinued Ms. Bloom's metoprolol tartrate, azithromycin, and metoprolol tartrate. I am also having her maintain her multivitamin, Flax Seed Oil, Calcium Carbonate-Vitamin D (CALCIUM 600 + D PO), aspirin, Fish Oil, (IRON, FERROUS GLUCONATE, PO), Psyllium, levothyroxine, and pravastatin.  Meds ordered this encounter  Medications  . levothyroxine  (SYNTHROID, LEVOTHROID) 50 MCG tablet    Sig: take 1 tablet by mouth once daily    Dispense:  90 tablet    Refill:  3  . DISCONTD: metoprolol tartrate (LOPRESSOR) 25 MG tablet    Sig: take 1/2 tablets by mouth twice a day MAY TAKE AN EXTRA 1/2 TABLET FOR PALPITATIONS IF NEEDED    Dispense:  45 tablet    Refill:  3  . pravastatin (PRAVACHOL) 40 MG tablet    Sig: Take 1 tablet (40 mg total) by mouth every evening.    Dispense:  90 tablet    Refill:  2    Problem List Items Addressed This Visit    PALPITATIONS, OCCASIONAL    con't metoprolol F/u cardiology      Hypothyroidism   Relevant Medications   levothyroxine (SYNTHROID, LEVOTHROID) 50 MCG tablet   Other Relevant Orders   TSH   Hyperlipidemia    Check labs today con't pravastatin      Relevant Medications   pravastatin (PRAVACHOL) 40 MG tablet   Other Relevant Orders   Comp Met (CMET)   Lipid panel    Other Visit Diagnoses    Essential hypertension    -  Primary    Relevant Medications    pravastatin (PRAVACHOL) 40 MG tablet    Hyperlipemia        Relevant Medications    pravastatin (PRAVACHOL) 40 MG tablet    Anemia, iron deficiency        Relevant Orders    CBC with Differential/Platelet    Need for hepatitis C screening test        Relevant Orders    Hepatitis C antibody       Follow-up: Return in about 6 months (around 04/22/2016), or if symptoms worsen or fail to improve.  Garnet Koyanagi, DO      Subjective:

## 2015-10-25 LAB — HEPATITIS C ANTIBODY: HCV AB: NEGATIVE

## 2016-01-23 ENCOUNTER — Ambulatory Visit (INDEPENDENT_AMBULATORY_CARE_PROVIDER_SITE_OTHER): Payer: Medicare Other | Admitting: Medical

## 2016-01-23 ENCOUNTER — Encounter: Payer: Self-pay | Admitting: Medical

## 2016-01-23 VITALS — BP 100/62 | HR 67 | Temp 98.0°F | Ht 63.5 in | Wt 138.6 lb

## 2016-01-23 DIAGNOSIS — R05 Cough: Secondary | ICD-10-CM

## 2016-01-23 DIAGNOSIS — J209 Acute bronchitis, unspecified: Secondary | ICD-10-CM

## 2016-01-23 DIAGNOSIS — R059 Cough, unspecified: Secondary | ICD-10-CM

## 2016-01-23 DIAGNOSIS — J309 Allergic rhinitis, unspecified: Secondary | ICD-10-CM

## 2016-01-23 MED ORDER — BENZONATATE 100 MG PO CAPS: 100.0000 mg | ORAL_CAPSULE | Freq: Three times a day (TID) | ORAL | Status: DC | PRN

## 2016-01-23 MED ORDER — AZITHROMYCIN 250 MG PO TABS: ORAL_TABLET | ORAL | Status: DC

## 2016-01-23 MED ORDER — FLUTICASONE PROPIONATE 50 MCG/ACT NA SUSP: 2.0000 | Freq: Every day | NASAL | Status: AC

## 2016-01-23 NOTE — Patient Instructions (Signed)
You appear to have had flu syndrome on Monday but  have passed treatment time frame window.  However, you do appear to have secondary bronchitis. Will rx azithromycin antibiotic.  For cough will rx benzonatate.  For nasal congestion and some like allergies as well will rx flonase.  Follow up in 7 days or as needed

## 2016-01-23 NOTE — Progress Notes (Signed)
Subjective:    Patient ID: Nancy Cuevas, female    DOB: August 05, 1946, 70 y.o.   MRN: [id number]  HPI  Pt had chills and fever on Monday. Pt rested all day. She also had diffuse body aches all over. Some cough since then. Cough has been on and off intermittent. Pt was feeling gradually better. But last night sneezing and chest congestion. All through the night productive cough. Also some st last night.    Review of Systems  Constitutional: Positive for fever, chills and fatigue.  HENT: Positive for congestion and sore throat. Negative for ear pain, hearing loss, nosebleeds, rhinorrhea and sinus pressure.   Respiratory: Positive for cough. Negative for chest tightness, shortness of breath and wheezing.   Cardiovascular: Negative for chest pain and palpitations.  Gastrointestinal: Negative for abdominal pain.  Musculoskeletal: Positive for myalgias.  Psychiatric/Behavioral: Negative for behavioral problems and confusion.     Past Medical History  Diagnosis Date  . POSTMENOPAUSAL STATUS 02/07/2008  . OSTEOPENIA 03/24/2008  . HYPOTHYROIDISM 07/18/2007  . HYPERLIPIDEMIA 10/27/2010  . Bunion   . Contact dermatitis and other eczema, due to unspecified cause   . Nonspecific (abnormal) findings on radiological and other examination of other intrathoracic organs   . Contact dermatitis and other eczema, due to unspecified cause   . Female stress incontinence   . Hyperpotassemia   . Disorder of bone and cartilage, unspecified   . Palpitations     Echo (1/12) EF 0000000, mild diastolic dysfunction, atrial septal aneurysm. Holter (1/12): short runs of atrial tachycardia versus MAT. ;  Echo (14) EF 55-60%, normal wall motion, mild MR, mild LAE, atrial septal aneurysm  . Symptomatic menopausal or female climacteric states   . 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 Personal history of colonic and rectal adenomas 03/22/2013    Social History   Social History  . Marital Status:  Married    Spouse Name: N/A  . Number of Children: N/A  . Years of Education: N/A   Occupational History  . Not on file.   Social History Main Topics  . Smoking status: Never Smoker   . Smokeless tobacco: Never Used  . Alcohol Use: 1.8 oz/week    3 Glasses of wine per week  . Drug Use: No  . Sexual Activity: Not on file   Other Topics Concern  . Not on file   Social History Narrative   Exercise-- not regular     Past Surgical History  Procedure Laterality Date  . Left wrist hardware removal 10/12  10/12  . Retinal detachment surgery  2009    left  . Fracture surgery  june 2006    left wrist  . Cataract extraction  2011  . Carpal tunnel release  11/15/2011    Procedure: CARPAL TUNNEL RELEASE;  Surgeon: Tennis Must, MD;  Location: [facility];  Service: Orthopedics;  Laterality: Left;  . Hand surgery  oct 2012 and jan 2013    Dr Dean--[facility]  . Natural birth  1962, 1972  . Abdominal hysterectomy  1988    prolapse uterus  . Colonoscopy  [id number]    normal,  . Partial proctectomy by tem  06/22/2013    Procedure: PARTIAL PROCTECTOMY BY TEM;  Surgeon: Leighton Ruff, MD;  Location: [facility];  Service: General;;    Family History  Problem Relation Age of Onset  . Breast cancer      1st degree relative <50  .  Osteoporosis      1st degree relative  . Diabetes Mother     1st degree relative  . Hypertension Mother   . Stroke Mother   . Colon cancer Neg Hx   . Heart disease Father   . Cancer Maternal Aunt     Allergies  Allergen Reactions  . Sulfonamide Derivatives     REACTION: RASH    Current Outpatient Prescriptions on File Prior to Visit  Medication Sig Dispense Refill  . aspirin 81 MG tablet Take 81 mg by mouth every evening.      . Calcium Carbonate-Vitamin D (CALCIUM 600 + D PO) Take 3 tablets by mouth daily.     . Flaxseed, Linseed, (FLAX SEED OIL) 1000 MG CAPS Take 1 capsule by mouth daily. TAKE ONE TAB TWICE A DAY    . IRON,  FERROUS GLUCONATE, PO Take 1 capsule by mouth daily.    Marland Kitchen levothyroxine (SYNTHROID, LEVOTHROID) 50 MCG tablet take 1 tablet by mouth once daily 90 tablet 3  . Multiple Vitamin (MULTIVITAMIN) tablet Take 1 tablet by mouth daily.      . Omega-3 Fatty Acids (FISH OIL) 1000 MG CAPS Take 1 capsule by mouth daily. TAKE ONE TAB TWICE A DAY    . pravastatin (PRAVACHOL) 40 MG tablet Take 1 tablet (40 mg total) by mouth every evening. 90 tablet 2  . Psyllium 30.9 % POWD as needed. Take a fiber supplement daily     No current facility-administered medications on file prior to visit.    BP 100/62 mmHg  Pulse 67  Temp(Src) 98 F (36.7 C) (Oral)  Ht 5' 3.5" (1.613 m)  Wt 138 lb 9.6 oz (62.869 kg)  BMI 24.16 kg/m2  SpO2 98%      Objective:   Physical Exam  General  Mental Status - Alert. General Appearance - Well groomed. Not in acute distress. Mild hoarse voice.  Skin Rashes- No Rashes.  HEENT Head- Normal. Ear Auditory Canal - Left- Normal. Right - Normal.Tympanic Membrane- Left- Normal. Right- Normal. Eye Sclera/Conjunctiva- Left- Normal. Right- Normal. Nose & Sinuses Nasal Mucosa- Left-  Boggy and Congested. Right-  Boggy and  Congested.Bilateral no  maxillary and no  frontal sinus pressure. Mouth & Throat Lips: Upper Lip- Normal: no dryness, cracking, pallor, cyanosis, or vesicular eruption. Lower Lip-Normal: no dryness, cracking, pallor, cyanosis or vesicular eruption. Buccal Mucosa- Bilateral- No Aphthous ulcers. Oropharynx- No Discharge or Erythema. +pnd Tonsils: Characteristics- Bilateral- No Erythema or Congestion. Size/Enlargement- Bilateral- No enlargement. Discharge- bilateral-None.  Neck Neck- Supple. No Masses.   Chest and Lung Exam Auscultation: Breath Sounds:-Clear even and unlabored.  Cardiovascular Auscultation:Rythm- Regular, rate and rhythm. Murmurs & Other Heart Sounds:Ausculatation of the heart reveal- No Murmurs.  Lymphatic Head & Neck General Head &  Neck Lymphatics: Bilateral: Description- No Localized lymphadenopathy.       Assessment & Plan:  You appear to have had flu syndrome on Monday but  have passed treatment time frame window.  However, you do appear to have secondary bronchitis. Will rx azithromycin antibiotic.  For cough will rx benzonatate.  For nasal congestion and some like allergies as well will rx flonase.  Follow up in 7 days or as needed

## 2016-01-23 NOTE — Progress Notes (Signed)
Pre visit review using our clinic review tool, if applicable. No additional management support is needed unless otherwise documented below in the visit note. 

## 2016-02-19 IMAGING — MG MM SCREEN MAMMOGRAM BILATERAL
5 series · 5 of 5 positions shown · non-contrast
Comparison: Previous exam(s)

ACR Breast Density Category a: The breast tissue is almost entirely
fatty.

CLINICAL DATA: Screening.

EXAM:
DIGITAL SCREENING BILATERAL MAMMOGRAM WITH CAD

[R CC]
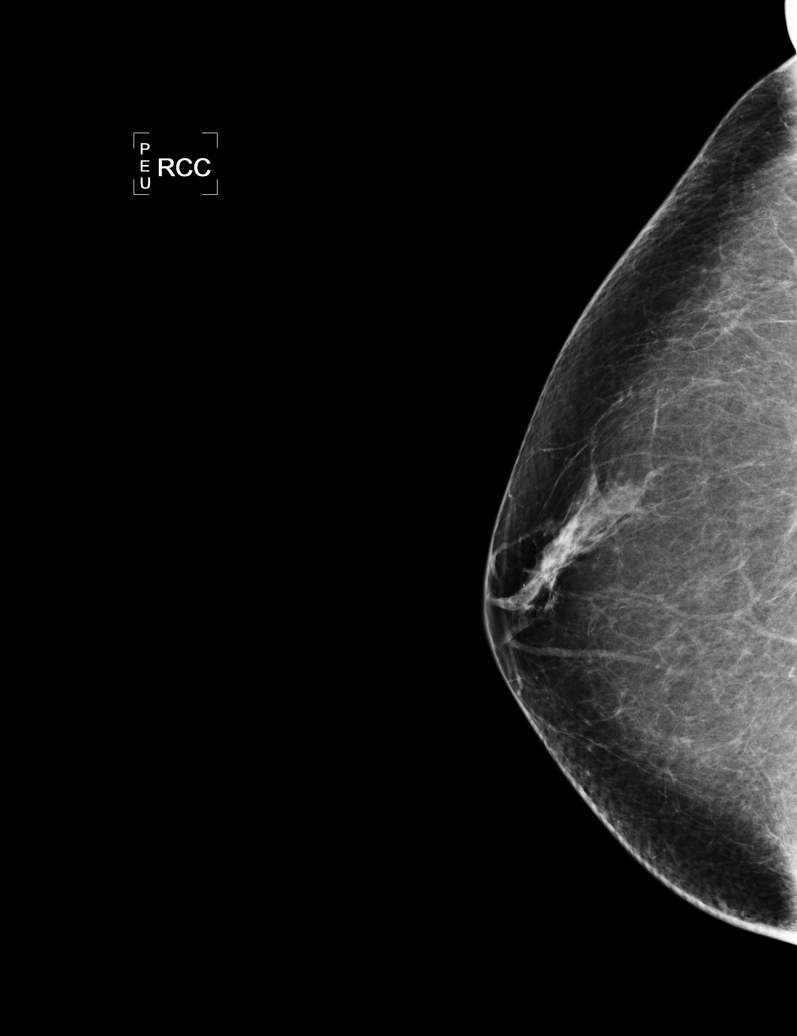

[L CC]
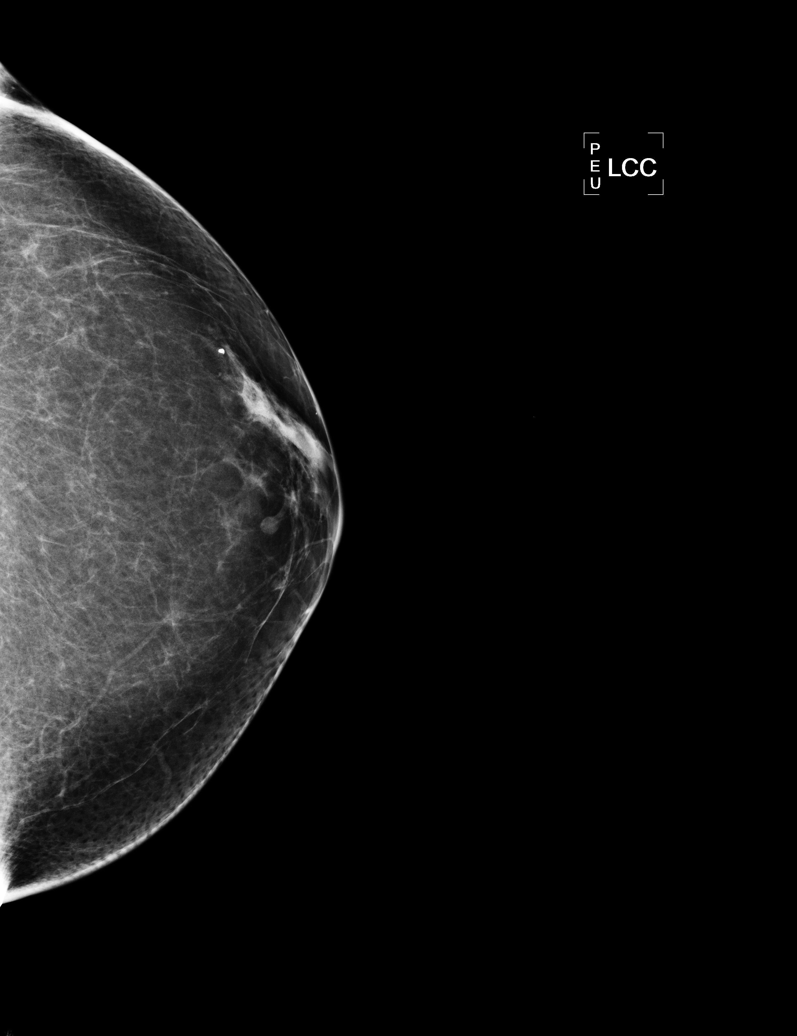

[L MLO (1 of 2)]
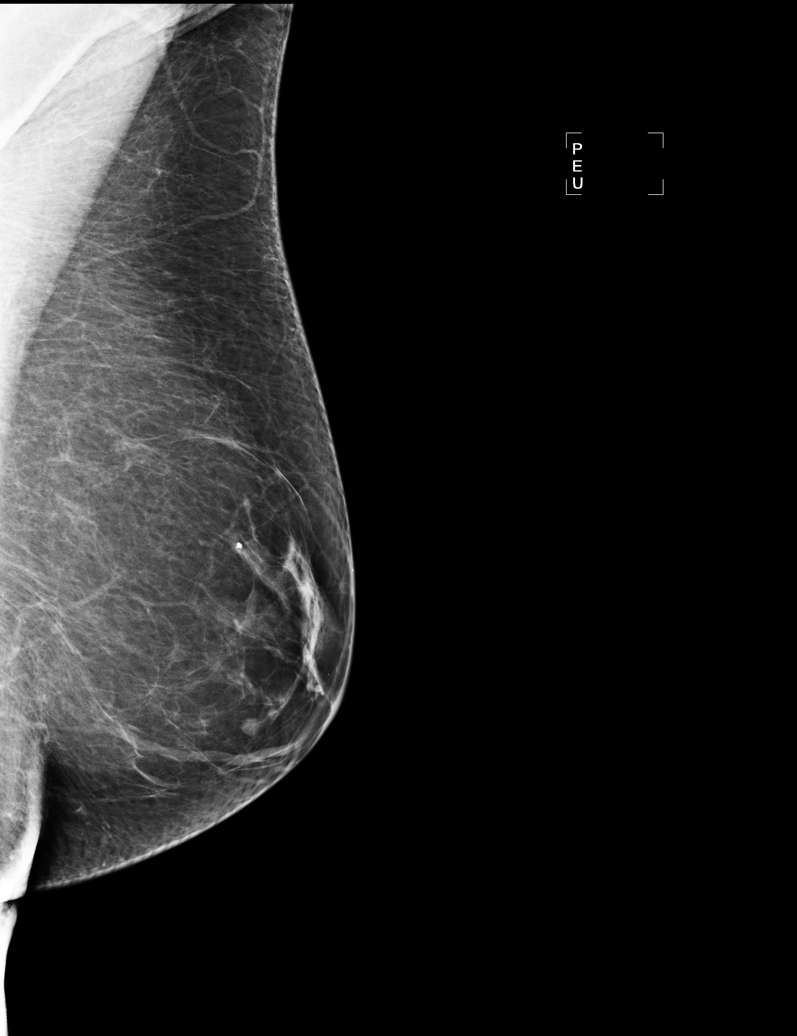

[R MLO]
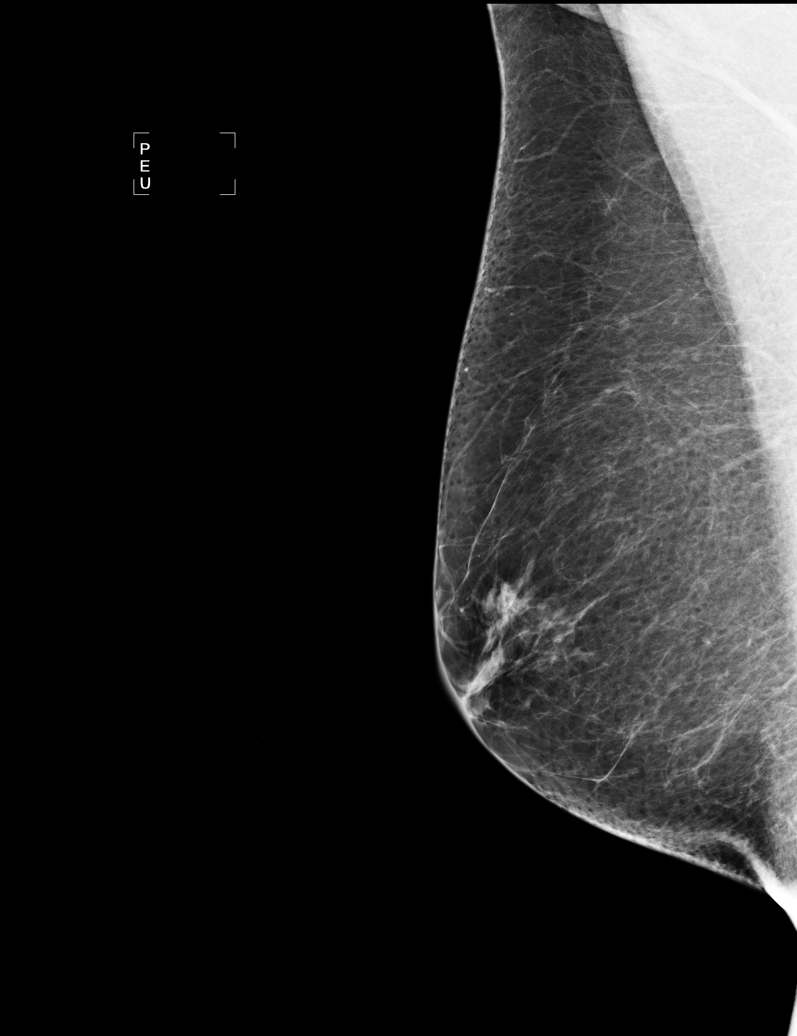

[L MLO (2 of 2)]
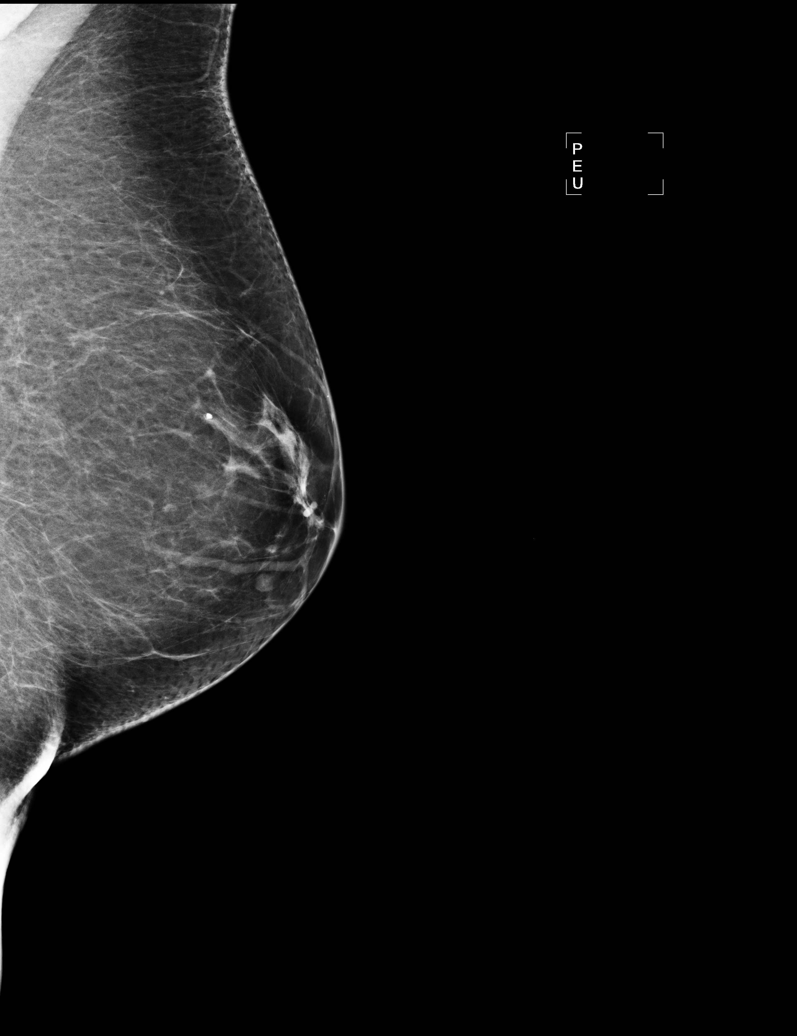

[5 of 5 positions shown; findings below may reference images not displayed]

FINDINGS: There are no findings suspicious for malignancy. Images were
processed with CAD.
IMPRESSION: No mammographic evidence of malignancy. A result letter of this
screening mammogram will be mailed directly to the patient.

RECOMMENDATION:
Screening mammogram in one year. (Code:[id number])

BI-RADS CATEGORY  1: Negative.

## 2016-04-09 ENCOUNTER — Ambulatory Visit (INDEPENDENT_AMBULATORY_CARE_PROVIDER_SITE_OTHER): Payer: Medicare Other | Admitting: Cardiology

## 2016-04-09 ENCOUNTER — Encounter: Payer: Self-pay | Admitting: Cardiology

## 2016-04-09 VITALS — BP 124/64 | HR 60 | Ht 63.5 in | Wt 138.0 lb

## 2016-04-09 DIAGNOSIS — E785 Hyperlipidemia, unspecified: Secondary | ICD-10-CM | POA: Diagnosis not present

## 2016-04-09 DIAGNOSIS — R002 Palpitations: Secondary | ICD-10-CM | POA: Diagnosis not present

## 2016-04-09 MED ORDER — METOPROLOL TARTRATE 25 MG PO TABS: ORAL_TABLET | ORAL | Status: DC

## 2016-04-09 NOTE — Patient Instructions (Addendum)
Medication Instructions:  Your physician recommends that you continue on your current medications as directed. Please refer to the Current Medication list given to you today.  Labwork: None  ordered  Testing/Procedures: None ordered   Follow-Up: Your physician wants you to follow-up in: 1 year with Dr. Aundra Dubin.  You will receive a reminder letter in the mail two months in advance. If you don't receive a letter, please call our office to schedule the follow-up appointment.   Any Other Special Instructions Will Be Listed Below (If Applicable).  If you need a refill on your cardiac medications before your next appointment, please call your pharmacy.  Thank you for choosing [facility]!!

## 2016-04-11 NOTE — Progress Notes (Signed)
Patient ID: Nancy Cuevas, female   DOB: 06-Jun-1946, 70 y.o.   MRN: [id number] PCP: Dr. Etter Sjogren  70 yo with history of palpitations and PACs/atrial tachycardia presents for followup.  She saw Richardson Dopp in 8/14 for recurrent palpitations.  She had been on a trip to [place] and had drunk more wine than usual.  She associated this with increased palpitations.  Holter showed frequent PACs, short runs of atrial tachycardia, and one short run of NSVT.  She was started on a low dose of metoprolol, 12.5 mg bid.  Since starting metropolol, she really has noted no more palpitations.  She has been feeling good.  Echo in 9/14 showed EF 55-60% with mild MR.  No exertional dyspnea or chest pain.  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  No lightheadedness or syncope.   Labs (12/11): LDL 177, HDL 100, Lp(a) < 15  Labs (1/12): K 5.2, creatinine 0.7  Labs (6/13): HDL 96, LDL 113, K 4.7, creatinine 0.6, TSH normal.  Labs (4/14): TSH normal, LDL 98, HDL 85 Labs (8/14): K 4.8, creatinine 0.68 Labs (11/15): K 3.9, creatinine 0.6, HCT 35.1, LDL 173 Labs (1/17): TSH normal, creatinine 0.68, LDL 108  ECG: NSR, normal  Allergies:  1) ! Sulfa   Past Medical History:  1. Hypothyroidism  2. Osteopenia  3. Palpitations: Echo (1/12) EF 0000000, mild diastolic dysfunction, atrial septal aneurysm. Holter (1/12): short runs of atrial tachycardia versus MAT.  Holter (8/14) with PACs, short runs of atrial tachycardia, short run of NSVT.  Echo (9/14) with EF 55-60%, mild MR.  4. Hyperlipidemia   Family History:   Mother : deceased 88 yo  FATHER:DECEASED  1 BROTHER:LIVING  HEART: FATHER-OPEN-HEART, UNCLES (MOTHER'S SIDE) , MOTHER, MGM,MGF,PGF  Family History Breast cancer 1st degree relative <50: 2 AUNTS (17 SISTER)  Family History Lung cancer: AUNT-MOTHER'S SIDE  Family History Diabetes 1st degree relative: MOTHER, 2 AUNTS (MOTHER'S SIDE)  Family History Hypertension: MOTHER  Family History High  cholesterol:MOTHER  Family History Thyroid disease:MOTHER  Family History Osteoporosis: AUNT 37 SIDE  M-- dementia  Family History of Stroke F 1st degree relative   Social History:  Conservation officer, nature  Married  Never Smoked  Alcohol use-yes  Drug use-no  Regular exercise-yes   Current Outpatient Prescriptions  Medication Sig Dispense Refill  . aspirin 81 MG tablet Take 81 mg by mouth every evening.      . Calcium Carbonate-Vitamin D (CALCIUM 600 + D PO) Take 3 tablets by mouth daily.     . Flaxseed, Linseed, (FLAX SEED OIL) 1000 MG CAPS Take 1 capsule by mouth daily. TAKE ONE TAB TWICE A DAY    . fluticasone (FLONASE) 50 MCG/ACT nasal spray Place 2 sprays into both nostrils daily. 16 g 1  . IRON, FERROUS GLUCONATE, PO Take 1 capsule by mouth daily.    Marland Kitchen levothyroxine (SYNTHROID, LEVOTHROID) 50 MCG tablet take 1 tablet by mouth once daily 90 tablet 3  . metoprolol tartrate (LOPRESSOR) 25 MG tablet take 1/2 tablet by mouth twice a day MAY TAKE AN EXTRA 1/2 TABLET FOR PALPITATIONS IF NEEDED 45 tablet 11  . Multiple Vitamin (MULTIVITAMIN) tablet Take 1 tablet by mouth daily.      . Omega-3 Fatty Acids (FISH OIL) 1000 MG CAPS Take 1 capsule by mouth daily. TAKE ONE TAB TWICE A DAY    . pravastatin (PRAVACHOL) 40 MG tablet Take 1 tablet (40 mg total) by mouth every evening. 90 tablet 2  . Psyllium 30.9 %  POWD as needed. Take a fiber supplement daily     No current facility-administered medications for this visit.    BP 124/64 mmHg  Pulse 60  Ht 5' 3.5" (1.613 m)  Wt 138 lb (62.596 kg)  BMI 24.06 kg/m2 General: NAD Neck: No JVD, no thyromegaly or thyroid nodule.  Lungs: Clear to auscultation bilaterally with normal respiratory effort. CV: Nondisplaced PMI.  Heart regular S1/S2, no S3/S4, no murmur.  No peripheral edema.  No carotid bruit.  Normal pedal pulses.  Abdomen: Soft, nontender, no hepatosplenomegaly, no distention.  Neurologic: Alert and oriented x 3.   Psych: Normal affect. Extremities: No clubbing or cyanosis.   Assessment/Plan: 1. Palpitations: Frequent PACs and short runs of atrial tachycardia on last holter monitor.  Significant symptomatic improvement on beta blocker.  Currently, not feeling palpitations.  She is likely at increased risk for atrial fibrillation down the road.  Continue metoprolol.  2.  Hyperlipidemia: Good lipids on pravastatin. 3. Continue ASA 81 mg daily given family history.   Loralie Champagne 04/11/2016

## 2016-04-12 ENCOUNTER — Other Ambulatory Visit: Payer: Self-pay | Admitting: Family Medicine

## 2016-04-12 DIAGNOSIS — Z1231 Encounter for screening mammogram for malignant neoplasm of breast: Secondary | ICD-10-CM

## 2016-04-27 ENCOUNTER — Encounter: Payer: Self-pay | Admitting: Internal Medicine

## 2016-04-29 ENCOUNTER — Ambulatory Visit: Payer: Medicare Other | Admitting: Family Medicine

## 2016-05-04 ENCOUNTER — Ambulatory Visit
Admission: RE | Admit: 2016-05-04 | Discharge: 2016-05-04 | Disposition: A | Discharge status: Home / Self Care | Payer: Medicare Other | Source: Ambulatory Visit | Attending: Family Medicine | Admitting: Family Medicine

## 2016-05-04 DIAGNOSIS — Z1231 Encounter for screening mammogram for malignant neoplasm of breast: Secondary | ICD-10-CM

## 2016-05-25 ENCOUNTER — Ambulatory Visit (INDEPENDENT_AMBULATORY_CARE_PROVIDER_SITE_OTHER): Payer: Medicare Other | Admitting: Family Medicine

## 2016-05-25 ENCOUNTER — Encounter: Payer: Self-pay | Admitting: Family Medicine

## 2016-05-25 VITALS — BP 122/58 | HR 55 | Temp 99.0°F | Resp 17 | Ht 64.0 in | Wt 138.4 lb

## 2016-05-25 DIAGNOSIS — I10 Essential (primary) hypertension: Secondary | ICD-10-CM

## 2016-05-25 DIAGNOSIS — E785 Hyperlipidemia, unspecified: Secondary | ICD-10-CM

## 2016-05-25 DIAGNOSIS — E039 Hypothyroidism, unspecified: Secondary | ICD-10-CM

## 2016-05-25 LAB — COMPREHENSIVE METABOLIC PANEL
ALT: 21 U/L (ref 0–35)
AST: 27 U/L (ref 0–37)
Albumin: 4.5 g/dL (ref 3.5–5.2)
Alkaline Phosphatase: 64 U/L (ref 39–117)
BILIRUBIN TOTAL: 0.5 mg/dL (ref 0.2–1.2)
BUN: 17 mg/dL (ref 6–23)
CO2: 30 meq/L (ref 19–32)
Calcium: 10.2 mg/dL (ref 8.4–10.5)
Chloride: 101 mEq/L (ref 96–112)
Creatinine, Ser: 0.71 mg/dL (ref 0.40–1.20)
GFR: 86.02 mL/min (ref >60.00)
Glucose, Bld: 89 mg/dL (ref 70–99)
Potassium: 5.2 mEq/L — ABNORMAL HIGH (ref 3.5–5.1)
SODIUM: 138 meq/L (ref 135–145)
Total Protein: 7.5 g/dL (ref 6.0–8.3)

## 2016-05-25 LAB — LIPID PANEL
CHOL/HDL RATIO: 2
Cholesterol: 207 mg/dL — ABNORMAL HIGH (ref 0–200)
HDL: 88 mg/dL (ref >39.00)
LDL Cholesterol: 104 mg/dL — ABNORMAL HIGH (ref 0–99)
NONHDL: 119.3
Triglycerides: 77 mg/dL (ref 0.0–149.0)
VLDL: 15.4 mg/dL (ref 0.0–40.0)

## 2016-05-25 LAB — TSH: TSH: 3.31 u[IU]/mL (ref 0.35–4.50)

## 2016-05-25 MED ORDER — LEVOTHYROXINE SODIUM 50 MCG PO TABS: ORAL_TABLET | ORAL | 3 refills | Status: DC

## 2016-05-25 MED ORDER — METOPROLOL TARTRATE 25 MG PO TABS: ORAL_TABLET | ORAL | 11 refills | Status: DC

## 2016-05-25 MED ORDER — PRAVASTATIN SODIUM 40 MG PO TABS: 40.0000 mg | ORAL_TABLET | Freq: Every evening | ORAL | 2 refills | Status: DC

## 2016-05-25 NOTE — Progress Notes (Signed)
Patient ID: Nancy Cuevas, female    DOB: Mar 15, 1946  Age: 70 y.o. MRN: [id number]    Subjective:  Subjective  HPI Nancy Cuevas presents for f/u bp, cholesterol--- no complaints    Review of Systems  Constitutional: Negative for activity change, appetite change, fatigue and unexpected weight change.  Respiratory: Negative for cough and shortness of breath.   Cardiovascular: Negative for chest pain and palpitations.  Psychiatric/Behavioral: Negative for behavioral problems and dysphoric mood. The patient is not nervous/anxious.     History Past Medical History:  Diagnosis Date  . 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 Bunion   . Contact dermatitis and other eczema, due to unspecified cause   . Contact dermatitis and other eczema, due to unspecified cause   . Disorder of bone and cartilage, unspecified   . Female stress incontinence   . HYPERLIPIDEMIA 10/27/2010  . Hyperpotassemia   . HYPOTHYROIDISM 07/18/2007  . Nonspecific (abnormal) findings on radiological and other examination of other intrathoracic organs   . OSTEOPENIA 03/24/2008  . Palpitations    Echo (1/12) EF 0000000, mild diastolic dysfunction, atrial septal aneurysm. Holter (1/12): short runs of atrial tachycardia versus MAT. ;  Echo (14) EF 55-60%, normal wall motion, mild MR, mild LAE, atrial septal aneurysm  . Personal history of colonic and rectal adenomas 03/22/2013  . POSTMENOPAUSAL STATUS 02/07/2008  . 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   Her family history includes Cancer in her maternal aunt; Diabetes in  her mother; Heart disease in her father; Hypertension in her mother; Stroke in her mother.She reports that she has never smoked. She has never used smokeless tobacco. She reports that she drinks about 1.8 oz of alcohol per week . She reports that she does not use drugs.  Current Outpatient Prescriptions on File Prior to Visit  Medication Sig Dispense Refill  . aspirin 81 MG tablet Take 81 mg by mouth every evening.      . Calcium Carbonate-Vitamin D (CALCIUM 600 + D PO) Take 3 tablets by mouth daily.     . Flaxseed, Linseed, (FLAX SEED OIL) 1000 MG CAPS Take 1 capsule by mouth daily. TAKE ONE TAB TWICE A DAY    . fluticasone (FLONASE) 50 MCG/ACT nasal spray Place 2 sprays into both nostrils daily. 16 g 1  . IRON, FERROUS GLUCONATE, PO Take 1 capsule by mouth daily.    . Multiple Vitamin (MULTIVITAMIN) tablet Take 1 tablet by mouth daily.      . Omega-3 Fatty Acids (FISH OIL) 1000 MG CAPS Take 1 capsule by mouth daily. TAKE ONE TAB TWICE A DAY    . Psyllium 30.9 % POWD as needed. Take a fiber supplement daily     No current facility-administered medications on file prior to visit.      Objective:  Objective  Physical Exam  Constitutional: She is oriented to person, place, and time. She appears well-developed and well-nourished.  HENT:  Head: Normocephalic and atraumatic.  Eyes: Conjunctivae and EOM are normal.  Neck: Normal range of motion. Neck supple. No JVD present. Carotid bruit is not present. No thyromegaly present.  Cardiovascular: Normal rate, regular rhythm and  normal heart sounds.   No murmur heard. Pulmonary/Chest: Effort normal and breath sounds normal. No respiratory distress. She has no wheezes. She has no rales. She exhibits no tenderness.  Musculoskeletal: She exhibits no edema.  Neurological: She is alert and oriented to person, place, and time.  Psychiatric: She has a normal mood and affect. Her behavior is normal. Judgment and thought content normal.  Nursing note and  vitals reviewed.  BP (!) 122/58 (BP Location: Left Arm, Patient Position: Sitting, Cuff Size: Large)   Pulse (!) 55   Temp 99 F (37.2 C)   Resp 17   Ht 5\' 4"  (1.626 m)   Wt 138 lb 6.4 oz (62.8 kg)   SpO2 98%   BMI 23.76 kg/m  Wt Readings from Last 3 Encounters:  05/25/16 138 lb 6.4 oz (62.8 kg)  04/09/16 138 lb (62.6 kg)  01/23/16 138 lb 9.6 oz (62.9 kg)     Lab Results  Component Value Date   WBC 5.1 10/24/2015   HGB 13.1 10/24/2015   HCT 39.1 10/24/2015   PLT 274.0 10/24/2015   GLUCOSE 92 10/24/2015   CHOL 217 (H) 10/24/2015   TRIG 60.0 10/24/2015   HDL 97.80 10/24/2015   LDLDIRECT 112.9 03/23/2012   LDLCALC 108 (H) 10/24/2015   ALT 27 10/24/2015   AST 32 10/24/2015   NA 140 10/24/2015   K 4.0 10/24/2015   CL 102 10/24/2015   CREATININE 0.68 10/24/2015   BUN 17 10/24/2015   CO2 31 10/24/2015   TSH 4.48 10/24/2015    Mm Screening Breast Tomo Bilateral  Result Date: 05/05/2016 CLINICAL DATA:  Screening. EXAM: 2D DIGITAL SCREENING BILATERAL MAMMOGRAM WITH CAD AND ADJUNCT TOMO COMPARISON:  Previous exam(s). ACR Breast Density Category b: There are scattered areas of fibroglandular density. FINDINGS: There are no findings suspicious for malignancy. Images were processed with CAD. IMPRESSION: No mammographic evidence of malignancy. A result letter of this screening mammogram will be mailed directly to the patient. RECOMMENDATION: Screening mammogram in one year. (Code:SM-B-01Y) BI-RADS CATEGORY  1: Negative. Electronically Signed   By: Lillia Mountain M.D.   On: 05/05/2016 14:14     Assessment & Plan:  Plan  I am having Nancy Cuevas maintain her multivitamin, Flax Seed Oil, Calcium Carbonate-Vitamin D (CALCIUM 600 + D PO), aspirin, Fish Oil, (IRON, FERROUS GLUCONATE, PO), Psyllium, fluticasone, pravastatin, metoprolol tartrate, and levothyroxine.  Meds ordered this encounter  Medications  . pravastatin (PRAVACHOL) 40 MG tablet    Sig: Take 1 tablet (40 mg total) by mouth  every evening.    Dispense:  90 tablet    Refill:  2  . metoprolol tartrate (LOPRESSOR) 25 MG tablet    Sig: take 1/2 tablet by mouth twice a day MAY TAKE AN EXTRA 1/2 TABLET FOR PALPITATIONS IF NEEDED    Dispense:  45 tablet    Refill:  11  . levothyroxine (SYNTHROID, LEVOTHROID) 50 MCG tablet    Sig: take 1 tablet by mouth once daily    Dispense:  90 tablet    Refill:  3    Problem List Items Addressed This Visit      Unprioritized   Hypothyroidism   Relevant Medications   metoprolol tartrate (LOPRESSOR) 25 MG tablet   levothyroxine (SYNTHROID, LEVOTHROID) 50 MCG tablet    Other Visit Diagnoses    Hyperlipidemia LDL goal <100    -  Primary   Relevant Medications   pravastatin (PRAVACHOL) 40 MG tablet   metoprolol tartrate (LOPRESSOR) 25  MG tablet   Other Relevant Orders   Lipid panel   Comprehensive metabolic panel   Hypothyroidism (acquired)       Relevant Medications   metoprolol tartrate (LOPRESSOR) 25 MG tablet   levothyroxine (SYNTHROID, LEVOTHROID) 50 MCG tablet   Other Relevant Orders   TSH   Hyperlipemia       Relevant Medications   pravastatin (PRAVACHOL) 40 MG tablet   metoprolol tartrate (LOPRESSOR) 25 MG tablet   Essential hypertension       Relevant Medications   pravastatin (PRAVACHOL) 40 MG tablet   metoprolol tartrate (LOPRESSOR) 25 MG tablet      Follow-up: Return in about 6 months (around 11/25/2016) for annual exam, fasting.  Ann Held, DO

## 2016-05-25 NOTE — Patient Instructions (Signed)

## 2016-06-22 ENCOUNTER — Telehealth: Payer: Self-pay | Admitting: Family Medicine

## 2016-06-22 NOTE — Telephone Encounter (Signed)
LM to schedule AWV with RN ° °

## 2016-08-04 ENCOUNTER — Encounter: Payer: Self-pay | Admitting: Internal Medicine

## 2016-09-16 ENCOUNTER — Encounter: Payer: Medicare Other | Admitting: Internal Medicine

## 2016-10-27 ENCOUNTER — Ambulatory Visit (AMBULATORY_SURGERY_CENTER): Payer: Self-pay | Admitting: *Deleted

## 2016-10-27 VITALS — Ht 64.0 in | Wt 142.8 lb

## 2016-10-27 DIAGNOSIS — Z86010 Personal history of colonic polyps: Secondary | ICD-10-CM

## 2016-10-27 NOTE — Progress Notes (Signed)
Denies allergies to eggs or soy products. Denies complications with sedation or anesthesia. Denies O2 use. Denies use of diet or weight loss medications.  Emmi instructions given for colonoscopy.  

## 2016-11-05 ENCOUNTER — Encounter: Payer: Self-pay | Admitting: Internal Medicine

## 2016-11-10 ENCOUNTER — Encounter: Payer: Medicare Other | Admitting: Internal Medicine

## 2016-11-19 ENCOUNTER — Encounter: Payer: Medicare Other | Admitting: Internal Medicine

## 2016-12-02 ENCOUNTER — Encounter: Payer: Self-pay | Admitting: Internal Medicine

## 2016-12-02 ENCOUNTER — Ambulatory Visit (AMBULATORY_SURGERY_CENTER): Payer: Medicare Other | Admitting: Internal Medicine

## 2016-12-02 VITALS — BP 122/59 | HR 51 | Temp 96.8°F | Resp 12 | Ht 64.0 in | Wt 142.0 lb

## 2016-12-02 DIAGNOSIS — D122 Benign neoplasm of ascending colon: Secondary | ICD-10-CM

## 2016-12-02 DIAGNOSIS — Z86010 Personal history of colonic polyps: Secondary | ICD-10-CM

## 2016-12-02 DIAGNOSIS — D128 Benign neoplasm of rectum: Secondary | ICD-10-CM

## 2016-12-02 MED ORDER — SODIUM CHLORIDE 0.9 % IV SOLN: 500.0000 mL | INTRAVENOUS | Status: DC

## 2016-12-02 NOTE — Op Note (Signed)
[place] Patient Name: Nancy Cuevas Procedure Date: 12/02/2016 1:51 PM MRN: [id number] Endoscopist: Gatha Mayer , MD Age: 71 Referring MD:  Date of Birth: 14-Jan-1946 Gender: Female Account #: [id number] Procedure:                Colonoscopy Indications:              Surveillance: Personal history of adenomatous                            polyps on last colonoscopy > 3 years ago Medicines:                Propofol per Anesthesia, Monitored Anesthesia Care Procedure:                Pre-Anesthesia Assessment:                           - Prior to the procedure, a History and Physical                            was performed, and patient medications and                            allergies were reviewed. The patient's tolerance of                            previous anesthesia was also reviewed. The risks                            and benefits of the procedure and the sedation                            options and risks were discussed with the patient.                            All questions were answered, and informed consent                            was obtained. Prior Anticoagulants: The patient                            last took aspirin 1 day prior to the procedure. ASA                            Grade Assessment: II - A patient with mild systemic                            disease. After reviewing the risks and benefits,                            the patient was deemed in satisfactory condition to                            undergo the procedure.  After obtaining informed consent, the colonoscope                            was passed under direct vision. Throughout the                            procedure, the patient's blood pressure, pulse, and                            oxygen saturations were monitored continuously. The                            Model PCF-[id number]) scope was introduced                            through the  anus and advanced to the the cecum,                            identified by appendiceal orifice and ileocecal                            valve. The colonoscopy was performed without                            difficulty. The patient tolerated the procedure                            well. The quality of the bowel preparation was                            excellent. The ileocecal valve, appendiceal                            orifice, and rectum were photographed. The bowel                            preparation used was Miralax. Scope In: 2:04:02 PM Scope Out: 2:38:42 PM Scope Withdrawal Time: 0 hours 26 minutes 12 seconds  Total Procedure Duration: 0 hours 34 minutes 40 seconds  Findings:                 The perianal and digital rectal examinations were                            normal.                           A 25 mm polyp was found in the proximal ascending                            colon. The polyp was sessile. The polyp was removed                            with a piecemeal technique using a hot snare.  Resection and retrieval were complete. Verification                            of patient identification for the specimen was                            done. Estimated blood loss: none.                           A 15 mm polyp was found in the anus. The polyp was                            sessile. The polyp was removed with a hot snare.                            The polyp was removed with a piecemeal technique                            using a hot snare. Resection and retrieval were                            complete. Verification of patient identification                            for the specimen was done. Estimated blood loss:                            none.                           A post polypectomy scar was found in the rectum.                            There was residual polyp tissue distal to anal                            verge. This  was removed as described above                           The colon (entire examined portion) was moderately                            redundant. Advancing the scope required using                            manual pressure.                           The exam was otherwise without abnormality on                            direct and retroflexion views. Complications:            No immediate complications. Estimated Blood Loss:     Estimated blood loss:  none. Impression:               - One 25 mm polyp in the proximal ascending colon,                            removed piecemeal using a hot snare. Resected and                            retrieved.                           - One 15 mm polyp at the anus, removed with a hot                            snare and removed piecemeal using a hot snare.                            Resected and retrieved. this was residual polyp                            from previous rectal polyp removed by transanal                            excision.                           - Post-polypectomy scar in the rectum.                           - Redundant colon.                           - The examination was otherwise normal on direct                            and retroflexion views.                           - Personal history of colonic polyps. Recommendation:           - Patient has a contact number available for                            emergencies. The signs and symptoms of potential                            delayed complications were discussed with the                            patient. Return to normal activities tomorrow.                            Written discharge instructions were provided to the                            patient.                           -  Resume previous diet.                           - Continue present medications.                           - No aspirin, ibuprofen, naproxen, or other                            non-steroidal  anti-inflammatory drugs for 2 weeks                            after polyp removal.                           - Await pathology results.                           - Repeat colonoscopy is recommended for                            surveillance after piecemeal polypectomy. The                            colonoscopy date will be determined after pathology                            results from today's exam become available for                            review. 6-12 mos anticipated                           - recticare prn rectal pain Gatha Mayer, MD 12/02/2016 2:53:27 PM This report has been signed electronically.

## 2016-12-02 NOTE — Patient Instructions (Addendum)
There was a new polyp removed from the colon and there was polyp growing near where the surgery was done in the rectum. I removed both but anticipate we will need to look again in about 6-12 months as these are difficult to totally remove with snares.  You might have some rectal pain as the polyp was close to the anus - if you do can apply some Recticare (5% lidocaine) available over the counter.  I appreciate the opportunity to care for you. Gatha Mayer, MD, [facility]   Discharge instructions given. Handout on polyps. No aspirin,ibuprofen,naproxen,or other non-steroidal anti-inflammatory drugs for 2 weeks. Resume previous medications. YOU HAD AN ENDOSCOPIC PROCEDURE TODAY AT [facility]:   Refer to the procedure report that was given to you for any specific questions about what was found during the examination.  If the procedure report does not answer your questions, please call your gastroenterologist to clarify.  If you requested that your care partner not be given the details of your procedure findings, then the procedure report has been included in a sealed envelope for you to review at your convenience later.  YOU SHOULD EXPECT: Some feelings of bloating in the abdomen. Passage of more gas than usual.  Walking can help get rid of the air that was put into your GI tract during the procedure and reduce the bloating. If you had a lower endoscopy (such as a colonoscopy or flexible sigmoidoscopy) you may notice spotting of blood in your stool or on the toilet paper. If you underwent a bowel prep for your procedure, you may not have a normal bowel movement for a few days.  Please Note:  You might notice some irritation and congestion in your nose or some drainage.  This is from the oxygen used during your procedure.  There is no need for concern and it should clear up in a day or so.  SYMPTOMS TO REPORT IMMEDIATELY:   Following lower endoscopy (colonoscopy or flexible  sigmoidoscopy):  Excessive amounts of blood in the stool  Significant tenderness or worsening of abdominal pains  Swelling of the abdomen that is new, acute  Fever of 100F or higher   For urgent or emergent issues, a gastroenterologist can be reached at any hour by calling [phone].   DIET:  We do recommend a small meal at first, but then you may proceed to your regular diet.  Drink plenty of fluids but you should avoid alcoholic beverages for 24 hours.  ACTIVITY:  You should plan to take it easy for the rest of today and you should NOT DRIVE or use heavy machinery until tomorrow (because of the sedation medicines used during the test).    FOLLOW UP: Our staff will call the number listed on your records the next business day following your procedure to check on you and address any questions or concerns that you may have regarding the information given to you following your procedure. If we do not reach you, we will leave a message.  However, if you are feeling well and you are not experiencing any problems, there is no need to return our call.  We will assume that you have returned to your regular daily activities without incident.  If any biopsies were taken you will be contacted by phone or by letter within the next 1-3 weeks.  Please call us at [phone] if you have not heard about the biopsies in 3 weeks.    SIGNATURES/CONFIDENTIALITY: You  and/or your care partner have signed paperwork which will be entered into your electronic medical record.  These signatures attest to the fact that that the information above on your After Visit Summary has been reviewed and is understood.  Full responsibility of the confidentiality of this discharge information lies with you and/or your care-partner.

## 2016-12-03 ENCOUNTER — Telehealth: Payer: Self-pay

## 2016-12-03 NOTE — Telephone Encounter (Signed)
  Follow up Call-  Call back number 12/02/2016  Post procedure Call Back phone  # [phone]  Permission to leave phone message Yes  Some recent data might be hidden     Patient questions:  Do you have a fever, pain , or abdominal swelling? No. Pain Score  0 *  Have you tolerated food without any problems? Yes.    Have you been able to return to your normal activities? Yes.    Do you have any questions about your discharge instructions: Diet   No. Medications  No. Follow up visit  No.  Do you have questions or concerns about your Care? No.  Actions: * If pain score is 4 or above: No action needed, pain <4.

## 2016-12-13 ENCOUNTER — Encounter: Payer: Self-pay | Admitting: Internal Medicine

## 2016-12-13 DIAGNOSIS — Z86010 Personal history of colonic polyps: Secondary | ICD-10-CM

## 2016-12-13 NOTE — Progress Notes (Signed)
Plan next colonoscopy at hospital w/ [facility]

## 2016-12-13 NOTE — Progress Notes (Signed)
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

## 2017-02-10 ENCOUNTER — Telehealth: Payer: Self-pay | Admitting: Family Medicine

## 2017-02-10 NOTE — Telephone Encounter (Signed)
Left pt message asking to call Allison back directly at [phone] to schedule AWV. Thanks! °

## 2017-03-16 NOTE — Progress Notes (Signed)
Subjective:   Nancy Cuevas is a 71 y.o. female who presents for Medicare Annual (Subsequent) preventive examination.  Review of Systems:  No ROS.  Medicare Wellness Visit.  Cardiac Risk Factors include: advanced age (>46men, >44 women);dyslipidemia  Sleep patterns: In general sleeps okay, but occasionally has restless sleep, feels rested on waking and gets up 1 times nightly to void.  Home Safety/Smoke Alarms: Feels safe in home. Smoke alarms in place. Security system in place. Carbon monoxide detectors in place.  Living environment; residence and Firearm Safety: Lives w/ husband and dog. 1-story house/ trailer, number of inside stairs: 1 flight to upstairs bonus room. Seat Belt Safety/Bike Helmet: Wears seat belt.   Counseling:   Eye Exam- Dr. Satira Sark yearly  Dental- Dr. Donn Pierini twice yearly  Female:   Pap- N/A; hysterectomy       Mammo- last 05/04/16. BI-RADS CATEGORY  1: Negative.         Dexa scan- last 02/22/13. Osteopenia.         CCS- last 12/02/16. 2 polyps removed, redundant colon, otherwise normal. 4 month recall.     Objective:     Vitals: BP 130/70 (BP Location: Right Arm, Patient Position: Sitting, Cuff Size: Normal)   Pulse (!) 51   Temp 97.9 F (36.6 C) (Oral)   Resp 16   Ht 5\' 3"  (1.6 m)   Wt 139 lb (63 kg)   SpO2 99%   BMI 24.62 kg/m   Body mass index is 24.62 kg/m.   Tobacco History  Smoking Status  . Never Smoker  Smokeless Tobacco  . Never Used     Counseling given: Not Answered   Past Medical History:  Diagnosis Date  . 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 Bunion   . Contact dermatitis and other eczema, due to unspecified cause   . Contact dermatitis and other eczema, due to unspecified cause   . Disorder of bone and cartilage, unspecified   . Female stress incontinence   . HYPERLIPIDEMIA 10/27/2010  . Hyperpotassemia   . HYPOTHYROIDISM 07/18/2007  . Nonspecific (abnormal) findings on radiological and other examination of  other intrathoracic organs   . OSTEOPENIA 03/24/2008  . Palpitations    Echo (1/12) EF 23-76%, mild diastolic dysfunction, atrial septal aneurysm. Holter (1/12): short runs of atrial tachycardia versus MAT. ;  Echo (14) EF 55-60%, normal wall motion, mild MR, mild LAE, atrial septal aneurysm  . Personal history of colonic and rectal adenomas 03/22/2013  . POSTMENOPAUSAL STATUS 02/07/2008  . Symptomatic menopausal or female climacteric states    Past Surgical History:  Procedure Laterality Date  . ABDOMINAL HYSTERECTOMY  1988   prolapse uterus  . CARPAL TUNNEL RELEASE  11/15/2011   Procedure: CARPAL TUNNEL RELEASE;  Surgeon: Tennis Must, MD;  Location: [facility];  Service: Orthopedics;  Laterality: Left;  . CATARACT EXTRACTION  2011  . COLONOSCOPY  2831,5176   normal,  . FOOT SURGERY Right 10/2014  . FRACTURE SURGERY  june 2006   left wrist  . HAND SURGERY  oct 2012 and jan 2013   Dr Dean--[facility]  . left wrist hardware removal 10/12  10/12  . natural birth  1962, 1974  . PARTIAL PROCTECTOMY BY TEM  06/22/2013   Procedure: PARTIAL PROCTECTOMY BY TEM;  Surgeon: Leighton Ruff, MD;  Location: [facility];  Service: General;;  . RETINAL DETACHMENT SURGERY  2009   left   Family History  Problem Relation Age  of Onset  . Diabetes Mother        1st degree relative  . Hypertension Mother   . Stroke Mother   . Heart disease Father   . Breast cancer Unknown        1st degree relative <50  . Osteoporosis Unknown        1st degree relative  . Cancer Maternal Aunt   . Cancer Cousin   . Heart disease Cousin   . Alzheimer's disease Cousin   . Colon cancer Neg Hx    History  Sexual Activity  . Sexual activity: No    Outpatient Encounter Prescriptions as of 03/17/2017  Medication Sig  . aspirin 81 MG tablet Take 81 mg by mouth every evening.    . Calcium Carbonate-Vitamin D (CALCIUM 600 + D PO) Take 1 tablet by mouth daily.   . Flaxseed, Linseed, (FLAX SEED OIL) 1000 MG  CAPS Take 1 capsule by mouth 2 (two) times daily.   . fluticasone (FLONASE) 50 MCG/ACT nasal spray Place 2 sprays into both nostrils daily.  . IRON, FERROUS GLUCONATE, PO Take 1 capsule by mouth daily.  Marland Kitchen levothyroxine (SYNTHROID, LEVOTHROID) 50 MCG tablet take 1 tablet by mouth once daily  . metoprolol tartrate (LOPRESSOR) 25 MG tablet take 1/2 tablet by mouth twice a day MAY TAKE AN EXTRA 1/2 TABLET FOR PALPITATIONS IF NEEDED  . Multiple Vitamin (MULTIVITAMIN) tablet Take 1 tablet by mouth daily.    . Omega-3 Fatty Acids (FISH OIL) 1000 MG CAPS Take 1 capsule by mouth 2 (two) times daily.   . Polyethylene Glycol 3350 (MIRALAX PO) Take 1 Dose by mouth 4 (four) times a week.  . pravastatin (PRAVACHOL) 40 MG tablet Take 1 tablet (40 mg total) by mouth every evening.  . [DISCONTINUED] 0.9 %  sodium chloride infusion    No facility-administered encounter medications on file as of 03/17/2017.     Activities of Daily Living In your present state of health, do you have any difficulty performing the following activities: 03/17/2017 05/25/2016  Hearing? N N  Vision? N N  Difficulty concentrating or making decisions? N N  Walking or climbing stairs? N N  Dressing or bathing? N N  Doing errands, shopping? N N  Preparing Food and eating ? N -  Using the Toilet? N -  Managing your Medications? N -  Managing your Finances? N -  Housekeeping or managing your Housekeeping? N -  Some recent data might be hidden    Patient Care Team: Carollee Herter, Alferd Apa, DO as PCP - General Carlean Purl Ofilia Neas, MD as Consulting Physician (Gastroenterology) Marygrace Drought, MD as Consulting Physician (Ophthalmology) Larey Dresser, MD as Consulting Physician (Cardiology) Ladell Pier, DDS as Consulting Physician (Dentistry)    Assessment:    Physical assessment deferred to PCP.  Exercise Activities and Dietary recommendations Current Exercise Habits: Home exercise routine, Type of exercise: walking;Other - see  comments (dancing). Walks 5000-7000 steps daily, tracking steps on FitBit. Also goes square dancing once weekly.  Diet (meal preparation, eat out, water intake, caffeinated beverages, dairy products, fruits and vegetables): in general, a "healthy" diet  , well balanced, on average, 3 meals per day. Enjoys fruits, veggies, salads. Snacks on nuts and almonds. Eggs and fruit for breakfast. Eats most meals at home and tries to make 'smart' choices when eating out. Drinks water throughout the day.   Goals    . Increase physical activity  Continue current exercise regimen and start going to the [facility] 3x weekly.      Fall Risk Fall Risk  03/17/2017 05/25/2016 10/24/2015 02/21/2015 02/12/2014  Falls in the past year? No No No No No   Depression Screen PHQ 2/9 Scores 03/17/2017 05/25/2016 10/24/2015 02/21/2015  PHQ - 2 Score 0 0 0 0     Cognitive Function MMSE - Mini Mental State Exam 03/17/2017  Orientation to time 5  Orientation to Place 5  Registration 3  Attention/ Calculation 5  Recall 3  Language- name 2 objects 2  Language- repeat 1  Language- follow 3 step command 3  Language- read & follow direction 1  Write a sentence 1  Copy design 1  Total score 30        Immunization History  Administered Date(s) Administered  . Influenza Split 09/17/2011  . Influenza Whole 08/30/2007, 07/31/2008, 09/02/2010, 09/01/2012  . Influenza, High Dose Seasonal PF 07/11/2013, 07/31/2014, 07/22/2015, 07/19/2016  . Pneumococcal Conjugate-13 07/31/2014  . Pneumococcal Polysaccharide-23 09/21/2010, 07/19/2016  . Td 02/07/2008  . Zoster 02/07/2008   Screening Tests Health Maintenance  Topic Date Due  . INFLUENZA VACCINE  05/18/2017  . COLONOSCOPY  12/02/2017  . TETANUS/TDAP  02/06/2018  . MAMMOGRAM  05/04/2018  . DEXA SCAN  Completed  . Hepatitis C Screening  Completed  . PNA vac Low Risk Adult  Completed      Plan:    Follow-up w/ PCP as directed.   Bring an updated copy of your advance  directives to your next office visit.  Continue doing brain stimulating activities (puzzles, reading, adult coloring books, staying active) to keep memory sharp.   I have personally reviewed and noted the following in the patient's chart:   . Medical and social history . Use of alcohol, tobacco or illicit drugs  . Current medications and supplements . Functional ability and status . Nutritional status . Physical activity . Advanced directives . List of other physicians . Vitals . Screenings to include cognitive, depression, and falls . Referrals and appointments  In addition, I have reviewed and discussed with patient certain preventive protocols, quality metrics, and best practice recommendations. A written personalized care plan for preventive services as well as general preventive health recommendations were provided to patient.     Dorrene German, RN  03/17/2017

## 2017-03-16 NOTE — Telephone Encounter (Signed)
Left pt message asking to call Allison back directly at [phone] to schedule AWV. Thanks! °

## 2017-03-16 NOTE — Telephone Encounter (Signed)
Scheduled 03/17/17

## 2017-03-17 ENCOUNTER — Ambulatory Visit (INDEPENDENT_AMBULATORY_CARE_PROVIDER_SITE_OTHER): Payer: Medicare Other | Admitting: Family Medicine

## 2017-03-17 ENCOUNTER — Encounter: Payer: Self-pay | Admitting: Family Medicine

## 2017-03-17 VITALS — BP 130/70 | HR 51 | Temp 97.9°F | Resp 16 | Ht 63.0 in | Wt 139.0 lb

## 2017-03-17 DIAGNOSIS — Z780 Asymptomatic menopausal state: Secondary | ICD-10-CM | POA: Diagnosis not present

## 2017-03-17 DIAGNOSIS — E785 Hyperlipidemia, unspecified: Secondary | ICD-10-CM

## 2017-03-17 DIAGNOSIS — Z0000 Encounter for general adult medical examination without abnormal findings: Secondary | ICD-10-CM | POA: Diagnosis not present

## 2017-03-17 DIAGNOSIS — E039 Hypothyroidism, unspecified: Secondary | ICD-10-CM | POA: Diagnosis not present

## 2017-03-17 DIAGNOSIS — I10 Essential (primary) hypertension: Secondary | ICD-10-CM | POA: Diagnosis not present

## 2017-03-17 MED ORDER — PRAVASTATIN SODIUM 40 MG PO TABS: 40.0000 mg | ORAL_TABLET | Freq: Every evening | ORAL | 3 refills | Status: DC

## 2017-03-17 MED ORDER — METOPROLOL TARTRATE 25 MG PO TABS: ORAL_TABLET | ORAL | 3 refills | Status: DC

## 2017-03-17 MED ORDER — LEVOTHYROXINE SODIUM 50 MCG PO TABS: ORAL_TABLET | ORAL | 3 refills | Status: DC

## 2017-03-17 NOTE — Assessment & Plan Note (Signed)
Check labs 

## 2017-03-17 NOTE — Assessment & Plan Note (Signed)
Tolerating statin, encouraged heart healthy diet, avoid trans fats, minimize simple carbs and saturated fats. Increase exercise as tolerated 

## 2017-03-17 NOTE — Patient Instructions (Addendum)
Nancy Cuevas , Thank you for taking time to come for your Medicare Wellness Visit. I appreciate your ongoing commitment to your health goals. Please review the following plan we discussed and let me know if I can assist you in the future.   Bring a copy of your advance directives to your next office visit.  Continue doing brain stimulating activities (puzzles, reading, adult coloring books, staying active) to keep memory sharp.   These are the goals we discussed: Goals    . Increase physical activity          Start going to the [facility] 3x weekly.       This is a list of the screening recommended for you and due dates:  Health Maintenance  Topic Date Due  . Flu Shot  05/18/2017  . Colon Cancer Screening  12/02/2017  . Tetanus Vaccine  02/06/2018  . Mammogram  05/04/2018  . DEXA scan (bone density measurement)  Completed  .  Hepatitis C: One time screening is recommended by Center for Disease Control  (CDC) for  adults born from [birth year] through 1967.   Completed  . Pneumonia vaccines  Completed    Preventive Care 73 Years and Older, Female Preventive care refers to lifestyle choices and visits with your health care provider that can promote health and wellness. What does preventive care include?  A yearly physical exam. This is also called an annual well check.  Dental exams once or twice a year.  Routine eye exams. Ask your health care provider how often you should have your eyes checked.  Personal lifestyle choices, including: ? Daily care of your teeth and gums. ? Regular physical activity. ? Eating a healthy diet. ? Avoiding tobacco and drug use. ? Limiting alcohol use. ? Practicing safe sex. ? Taking low-dose aspirin every day. ? Taking vitamin and mineral supplements as recommended by your health care provider. What happens during an annual well check? The services and screenings done by your health care provider during your annual well check will depend on your age,  overall health, lifestyle risk factors, and family history of disease. Counseling Your health care provider may ask you questions about your:  Alcohol use.  Tobacco use.  Drug use.  Emotional well-being.  Home and relationship well-being.  Sexual activity.  Eating habits.  History of falls.  Memory and ability to understand (cognition).  Work and work Statistician.  Reproductive health.  Screening You may have the following tests or measurements:  Height, weight, and BMI.  Blood pressure.  Lipid and cholesterol levels. These may be checked every 5 years, or more frequently if you are over 73 years old.  Skin check.  Lung cancer screening. You may have this screening every year starting at age 40 if you have a 30-pack-year history of smoking and currently smoke or have quit within the past 15 years.  Fecal occult blood test (FOBT) of the stool. You may have this test every year starting at age 73.  Flexible sigmoidoscopy or colonoscopy. You may have a sigmoidoscopy every 5 years or a colonoscopy every 10 years starting at age 65.  Hepatitis C blood test.  Hepatitis B blood test.  Sexually transmitted disease (STD) testing.  Diabetes screening. This is done by checking your blood sugar (glucose) after you have not eaten for a while (fasting). You may have this done every 1-3 years.  Bone density scan. This is done to screen for osteoporosis. You may have this done starting at  age 73.  Mammogram. This may be done every 1-2 years. Talk to your health care provider about how often you should have regular mammograms.  Talk with your health care provider about your test results, treatment options, and if necessary, the need for more tests. Vaccines Your health care provider may recommend certain vaccines, such as:  Influenza vaccine. This is recommended every year.  Tetanus, diphtheria, and acellular pertussis (Tdap, Td) vaccine. You may need a Td booster every 10  years.  Varicella vaccine. You may need this if you have not been vaccinated.  Zoster vaccine. You may need this after age 73.  Measles, mumps, and rubella (MMR) vaccine. You may need at least one dose of MMR if you were born in 1957 or later. You may also need a second dose.  Pneumococcal 13-valent conjugate (PCV13) vaccine. One dose is recommended after age 73.  Pneumococcal polysaccharide (PPSV23) vaccine. One dose is recommended after age 73.  Meningococcal vaccine. You may need this if you have certain conditions.  Hepatitis A vaccine. You may need this if you have certain conditions or if you travel or work in places where you may be exposed to hepatitis A.  Hepatitis B vaccine. You may need this if you have certain conditions or if you travel or work in places where you may be exposed to hepatitis B.  Haemophilus influenzae type b (Hib) vaccine. You may need this if you have certain conditions.  Talk to your health care provider about which screenings and vaccines you need and how often you need them. This information is not intended to replace advice given to you by your health care provider. Make sure you discuss any questions you have with your health care provider. Document Released: 10/31/2015 Document Revised: 06/23/2016 Document Reviewed: 08/05/2015 Elsevier Interactive Patient Education  2017 [employer].

## 2017-03-17 NOTE — Progress Notes (Signed)
Subjective:     Nancy Cuevas is a 71 y.o. female and is here for a comprehensive physical exam. The patient reports no problems.  Social History   Social History  . Marital status: Married    Spouse name: N/A  . Number of children: N/A  . Years of education: N/A   Occupational History  . Not on file.   Social History Main Topics  . Smoking status: Never Smoker  . Smokeless tobacco: Never Used  . Alcohol use Yes     Comment: rare  . Drug use: No  . Sexual activity: No   Other Topics Concern  . Not on file   Social History Narrative   Exercise-- not regular    Health Maintenance  Topic Date Due  . INFLUENZA VACCINE  05/18/2017  . COLONOSCOPY  12/02/2017  . TETANUS/TDAP  02/06/2018  . MAMMOGRAM  05/04/2018  . DEXA SCAN  Completed  . Hepatitis C Screening  Completed  . PNA vac Low Risk Adult  Completed    The following portions of the patient's history were reviewed and updated as appropriate:  She  has a past medical history of Benign neoplasm of rectum and anal canal (03/15/2013); Bunion; Contact dermatitis and other eczema, due to unspecified cause; Contact dermatitis and other eczema, due to unspecified cause; Disorder of bone and cartilage, unspecified; Female stress incontinence; HYPERLIPIDEMIA (10/27/2010); Hyperpotassemia; HYPOTHYROIDISM (07/18/2007); Nonspecific (abnormal) findings on radiological and other examination of other intrathoracic organs; OSTEOPENIA (03/24/2008); Palpitations; Personal history of colonic and rectal adenomas (03/22/2013); POSTMENOPAUSAL STATUS (02/07/2008); and Symptomatic menopausal or female climacteric states. She  does not have any pertinent problems on file. She  has a past surgical history that includes left wrist hardware removal 10/12 (10/12); Retinal detachment surgery (2009); Fracture surgery (june 2006); Cataract extraction (2011); Carpal tunnel release (11/15/2011); Hand surgery (oct 2012 and jan 2013); natural birth (1962, 1972);  Abdominal hysterectomy (1988); Colonoscopy (5638,7564); Partial proctectomy by tem (06/22/2013); and Foot surgery (Right, 10/2014). Her family history includes Alzheimer's disease in her cousin; Cancer in her cousin and maternal aunt; Diabetes in her mother; Heart disease in her cousin and father; Hypertension in her mother; Stroke in her mother. She  reports that she has never smoked. She has never used smokeless tobacco. She reports that she drinks alcohol. She reports that she does not use drugs. She has a current medication list which includes the following prescription(s): aspirin, calcium carb-cholecalciferol, flax seed oil, fluticasone, ferrous gluconate, levothyroxine, metoprolol tartrate, multivitamin, fish oil, polyethylene glycol 3350, and pravastatin. Current Outpatient Prescriptions on File Prior to Visit  Medication Sig Dispense Refill  . aspirin 81 MG tablet Take 81 mg by mouth every evening.      . Calcium Carbonate-Vitamin D (CALCIUM 600 + D PO) Take 1 tablet by mouth daily.     . Flaxseed, Linseed, (FLAX SEED OIL) 1000 MG CAPS Take 1 capsule by mouth 2 (two) times daily.     . fluticasone (FLONASE) 50 MCG/ACT nasal spray Place 2 sprays into both nostrils daily. 16 g 1  . IRON, FERROUS GLUCONATE, PO Take 1 capsule by mouth daily.    . Multiple Vitamin (MULTIVITAMIN) tablet Take 1 tablet by mouth daily.      . Omega-3 Fatty Acids (FISH OIL) 1000 MG CAPS Take 1 capsule by mouth 2 (two) times daily.     . Polyethylene Glycol 3350 (MIRALAX PO) Take 1 Dose by mouth 4 (four) times a week.     No current  facility-administered medications on file prior to visit.    She is allergic to sulfonamide derivatives..  Review of Systems Review of Systems  Constitutional: Negative for activity change, appetite change and fatigue.  HENT: Negative for hearing loss, congestion, tinnitus and ear discharge.  dentist q57m Eyes: Negative for visual disturbance (see optho q1y -- vision corrected to 20/20  with glasses).  Respiratory: Negative for cough, chest tightness and shortness of breath.   Cardiovascular: Negative for chest pain, palpitations and leg swelling.  Gastrointestinal: Negative for abdominal pain, diarrhea, constipation and abdominal distention.  Genitourinary: Negative for urgency, frequency, decreased urine volume and difficulty urinating.  Musculoskeletal: Negative for back pain, arthralgias and gait problem.  Skin: Negative for color change, pallor and rash.  Neurological: Negative for dizziness, light-headedness, numbness and headaches.  Hematological: Negative for adenopathy. Does not bruise/bleed easily.  Psychiatric/Behavioral: Negative for suicidal ideas, confusion, sleep disturbance, self-injury, dysphoric mood, decreased concentration and agitation.       Objective:    BP 130/70 (BP Location: Right Arm, Patient Position: Sitting, Cuff Size: Normal)   Pulse (!) 51   Temp 97.9 F (36.6 C) (Oral)   Resp 16   Ht 5\' 3"  (1.6 m)   Wt 139 lb (63 kg)   SpO2 99%   BMI 24.62 kg/m  General appearance: alert, cooperative, appears stated age and no distress Head: Normocephalic, without obvious abnormality, atraumatic Eyes: conjunctivae/corneas clear. PERRL, EOM's intact. Fundi benign. Ears: normal TM's and external ear canals both ears Nose: Nares normal. Septum midline. Mucosa normal. No drainage or sinus tenderness. Throat: lips, mucosa, and tongue normal; teeth and gums normal Neck: no adenopathy, no carotid bruit, no JVD, supple, symmetrical, trachea midline and thyroid not enlarged, symmetric, no tenderness/mass/nodules Back: symmetric, no curvature. ROM normal. No CVA tenderness. Lungs: clear to auscultation bilaterally Breasts: normal appearance, no masses or tenderness Heart: regular rate and rhythm, S1, S2 normal, no murmur, click, rub or gallop Abdomen: soft, non-tender; bowel sounds normal; no masses,  no organomegaly Pelvic: not indicated; status post  hysterectomy, negative ROS Extremities: extremities normal, atraumatic, no cyanosis or edema Pulses: 2+ and symmetric Skin: Skin color, texture, turgor normal. No rashes or lesions Lymph nodes: Cervical, supraclavicular, and axillary nodes normal. Neurologic: Alert and oriented X 3, normal strength and tone. Normal symmetric reflexes. Normal coordination and gait    Assessment:    Healthy female exam.      Plan:    ghm utd Check labs See After Visit Summary for Counseling Recommendations    1. Encounter for Medicare annual wellness exam See above  2. Asymptomatic postmenopausal state   - DG Bone Density; Future  3. Essential hypertension Well controlled, no changes to meds. Encouraged heart healthy diet such as the DASH diet and exercise as tolerated.   - metoprolol tartrate (LOPRESSOR) 25 MG tablet; take 1/2 tablet by mouth twice a day MAY TAKE AN EXTRA 1/2 TABLET FOR PALPITATIONS IF NEEDED  Dispense: 135 tablet; Refill: 3 - POCT urinalysis dipstick; Future - CBC with Differential/Platelet; Future  4. Hypothyroidism (acquired)  - levothyroxine (SYNTHROID, LEVOTHROID) 50 MCG tablet; take 1 tablet by mouth once daily  Dispense: 90 tablet; Refill: 3 - TSH; Future  5. Hyperlipidemia, unspecified hyperlipidemia type Tolerating statin, encouraged heart healthy diet, avoid trans fats, minimize simple carbs and saturated fats. Increase exercise as tolerated - pravastatin (PRAVACHOL) 40 MG tablet; Take 1 tablet (40 mg total) by mouth every evening.  Dispense: 90 tablet; Refill: 3 - CBC with Differential/Platelet; Future  6. Hyperlipidemia LDL goal <100  - pravastatin (PRAVACHOL) 40 MG tablet; Take 1 tablet (40 mg total) by mouth every evening.  Dispense: 90 tablet; Refill: 3 - Lipid panel; Future - Comprehensive metabolic panel; Future  7. Hypothyroidism, unspecified type

## 2017-03-29 ENCOUNTER — Other Ambulatory Visit (INDEPENDENT_AMBULATORY_CARE_PROVIDER_SITE_OTHER): Payer: Medicare Other

## 2017-03-29 DIAGNOSIS — E039 Hypothyroidism, unspecified: Secondary | ICD-10-CM | POA: Diagnosis not present

## 2017-03-29 DIAGNOSIS — E785 Hyperlipidemia, unspecified: Secondary | ICD-10-CM | POA: Diagnosis not present

## 2017-03-29 DIAGNOSIS — I10 Essential (primary) hypertension: Secondary | ICD-10-CM

## 2017-03-29 LAB — CBC WITH DIFFERENTIAL/PLATELET
BASOS ABS: 0 10*3/uL (ref 0.0–0.1)
Basophils Relative: 0.8 % (ref 0.0–3.0)
EOS PCT: 3.1 % (ref 0.0–5.0)
Eosinophils Absolute: 0.2 10*3/uL (ref 0.0–0.7)
HCT: 38.3 % (ref 36.0–46.0)
Hemoglobin: 13.1 g/dL (ref 12.0–15.0)
LYMPHS ABS: 1.7 10*3/uL (ref 0.7–4.0)
Lymphocytes Relative: 30.7 % (ref 12.0–46.0)
MCHC: 34.3 g/dL (ref 30.0–36.0)
MCV: 93.9 fl (ref 78.0–100.0)
MONO ABS: 0.7 10*3/uL (ref 0.1–1.0)
MONOS PCT: 12.7 % — AB (ref 3.0–12.0)
NEUTROS ABS: 2.9 10*3/uL (ref 1.4–7.7)
NEUTROS PCT: 52.7 % (ref 43.0–77.0)
PLATELETS: 269 10*3/uL (ref 150.0–400.0)
RBC: 4.08 Mil/uL (ref 3.87–5.11)
RDW: 12.6 % (ref 11.5–15.5)
WBC: 5.6 10*3/uL (ref 4.0–10.5)

## 2017-03-29 LAB — POCT URINALYSIS DIPSTICK
Bilirubin, UA: NEGATIVE
Glucose, UA: NEGATIVE
KETONES UA: NEGATIVE
Leukocytes, UA: NEGATIVE
Nitrite, UA: NEGATIVE
PROTEIN UA: NEGATIVE
RBC UA: NEGATIVE
SPEC GRAV UA: 1.02 (ref 1.010–1.025)
Urobilinogen, UA: 0.2 E.U./dL
pH, UA: 6 (ref 5.0–8.0)

## 2017-03-29 LAB — COMPREHENSIVE METABOLIC PANEL
ALK PHOS: 55 U/L (ref 39–117)
ALT: 19 U/L (ref 0–35)
AST: 26 U/L (ref 0–37)
Albumin: 4.5 g/dL (ref 3.5–5.2)
BILIRUBIN TOTAL: 0.4 mg/dL (ref 0.2–1.2)
BUN: 18 mg/dL (ref 6–23)
CO2: 32 mEq/L (ref 19–32)
CREATININE: 0.74 mg/dL (ref 0.40–1.20)
Calcium: 10 mg/dL (ref 8.4–10.5)
Chloride: 103 mEq/L (ref 96–112)
GFR: 81.81 mL/min (ref >60.00)
GLUCOSE: 95 mg/dL (ref 70–99)
Potassium: 5.1 mEq/L (ref 3.5–5.1)
SODIUM: 139 meq/L (ref 135–145)
TOTAL PROTEIN: 7.3 g/dL (ref 6.0–8.3)

## 2017-03-29 LAB — LIPID PANEL
Cholesterol: 208 mg/dL — ABNORMAL HIGH (ref 0–200)
HDL: 86.5 mg/dL (ref >39.00)
LDL Cholesterol: 110 mg/dL — ABNORMAL HIGH (ref 0–99)
NONHDL: 121.58
Total CHOL/HDL Ratio: 2
Triglycerides: 59 mg/dL (ref 0.0–149.0)
VLDL: 11.8 mg/dL (ref 0.0–40.0)

## 2017-03-29 LAB — TSH: TSH: 3.52 u[IU]/mL (ref 0.35–4.50)

## 2017-04-06 ENCOUNTER — Other Ambulatory Visit: Payer: Self-pay | Admitting: Family Medicine

## 2017-04-06 DIAGNOSIS — E785 Hyperlipidemia, unspecified: Secondary | ICD-10-CM

## 2017-05-02 ENCOUNTER — Telehealth: Payer: Self-pay

## 2017-05-02 NOTE — Telephone Encounter (Signed)
Left message for patient to call me back.  Per Dr Carlean Purl she needs a colonoscopy set up his hospital week hopefully in August for colon polyp dx. Needs to be done at the hospital because Dr Carlean Purl needs APC.  I want to check with the patient to see if she will be in town then, if not we will try for one of his Tues AM or Friday AM days.

## 2017-05-03 ENCOUNTER — Other Ambulatory Visit: Payer: Self-pay | Admitting: Internal Medicine

## 2017-05-03 DIAGNOSIS — D122 Benign neoplasm of ascending colon: Secondary | ICD-10-CM

## 2017-05-03 NOTE — Telephone Encounter (Signed)
Pt said she is returning your call [phone]

## 2017-05-03 NOTE — Telephone Encounter (Signed)
Spoke to E. Lopez and will call her back tomorrow with a date/time for procedure once I speak with the schedulers and also set her up for a pre-visit.

## 2017-05-04 NOTE — Telephone Encounter (Signed)
Spoke with Nancy Cuevas and I have her set up for pre-visit on 05/18/17 at 11:00AM and she will get her recall colon with APC done at [facility] on 05/26/17 at 2:00pm with Dr Carlean Purl.

## 2017-05-16 ENCOUNTER — Telehealth: Payer: Self-pay | Admitting: Internal Medicine

## 2017-05-16 ENCOUNTER — Other Ambulatory Visit: Payer: Self-pay | Admitting: Family Medicine

## 2017-05-16 DIAGNOSIS — Z1231 Encounter for screening mammogram for malignant neoplasm of breast: Secondary | ICD-10-CM

## 2017-05-16 NOTE — Telephone Encounter (Signed)
Patient notified that we could reschedule to 06/24/17 to arrive at 8:15 for 9:45

## 2017-05-18 ENCOUNTER — Ambulatory Visit (AMBULATORY_SURGERY_CENTER): Payer: Self-pay | Admitting: *Deleted

## 2017-05-18 ENCOUNTER — Ambulatory Visit
Admission: RE | Admit: 2017-05-18 | Discharge: 2017-05-18 | Disposition: A | Discharge status: Home / Self Care | Payer: Medicare Other | Source: Ambulatory Visit | Attending: Family Medicine | Admitting: Family Medicine

## 2017-05-18 VITALS — Ht 64.0 in | Wt 142.8 lb

## 2017-05-18 DIAGNOSIS — Z86010 Personal history of colonic polyps: Secondary | ICD-10-CM

## 2017-05-18 DIAGNOSIS — Z1231 Encounter for screening mammogram for malignant neoplasm of breast: Secondary | ICD-10-CM

## 2017-05-18 NOTE — Progress Notes (Signed)
No allergies to eggs or soy. No problems with anesthesia.  Pt given Emmi instructions for colonoscopy  No oxygen use  No diet drug use  

## 2017-06-06 ENCOUNTER — Other Ambulatory Visit: Payer: Self-pay | Admitting: Family Medicine

## 2017-06-06 DIAGNOSIS — E2839 Other primary ovarian failure: Secondary | ICD-10-CM

## 2017-06-07 ENCOUNTER — Encounter (HOSPITAL_COMMUNITY): Payer: Self-pay | Admitting: *Deleted

## 2017-06-10 ENCOUNTER — Encounter (HOSPITAL_COMMUNITY): Payer: Self-pay | Admitting: *Deleted

## 2017-06-24 ENCOUNTER — Encounter (HOSPITAL_COMMUNITY): Payer: Self-pay | Admitting: *Deleted

## 2017-06-24 ENCOUNTER — Encounter (HOSPITAL_COMMUNITY)
Admission: RE | Disposition: A | Discharge status: Home / Self Care | Payer: Self-pay | Source: Ambulatory Visit | Attending: Internal Medicine

## 2017-06-24 ENCOUNTER — Ambulatory Visit (HOSPITAL_COMMUNITY)
Admission: RE | Admit: 2017-06-24 | Discharge: 2017-06-24 | Disposition: A | Discharge status: Home / Self Care | Payer: Medicare Other | Source: Ambulatory Visit | Attending: Internal Medicine | Admitting: Internal Medicine

## 2017-06-24 ENCOUNTER — Ambulatory Visit (HOSPITAL_COMMUNITY): Payer: Medicare Other | Admitting: Registered Nurse

## 2017-06-24 DIAGNOSIS — Z79899 Other long term (current) drug therapy: Secondary | ICD-10-CM | POA: Diagnosis not present

## 2017-06-24 DIAGNOSIS — Z09 Encounter for follow-up examination after completed treatment for conditions other than malignant neoplasm: Secondary | ICD-10-CM

## 2017-06-24 DIAGNOSIS — Z7982 Long term (current) use of aspirin: Secondary | ICD-10-CM | POA: Diagnosis not present

## 2017-06-24 DIAGNOSIS — E785 Hyperlipidemia, unspecified: Secondary | ICD-10-CM | POA: Insufficient documentation

## 2017-06-24 DIAGNOSIS — Z1211 Encounter for screening for malignant neoplasm of colon: Secondary | ICD-10-CM | POA: Diagnosis not present

## 2017-06-24 DIAGNOSIS — M8580 Other specified disorders of bone density and structure, unspecified site: Secondary | ICD-10-CM | POA: Diagnosis not present

## 2017-06-24 DIAGNOSIS — Z86010 Personal history of colonic polyps: Secondary | ICD-10-CM | POA: Diagnosis not present

## 2017-06-24 DIAGNOSIS — E039 Hypothyroidism, unspecified: Secondary | ICD-10-CM | POA: Insufficient documentation

## 2017-06-24 DIAGNOSIS — D122 Benign neoplasm of ascending colon: Secondary | ICD-10-CM

## 2017-06-24 HISTORY — PX: HOT HEMOSTASIS: SHX5433

## 2017-06-24 HISTORY — PX: COLONOSCOPY WITH PROPOFOL: SHX5780

## 2017-06-24 SURGERY — COLONOSCOPY WITH PROPOFOL
Anesthesia: Monitor Anesthesia Care

## 2017-06-24 MED ORDER — PROPOFOL 500 MG/50ML IV EMUL
INTRAVENOUS | Status: DC | PRN
  Administered 2017-06-24: 200 ug/kg/min via INTRAVENOUS

## 2017-06-24 MED ORDER — SODIUM CHLORIDE 0.9 % IV SOLN: INTRAVENOUS | Status: DC

## 2017-06-24 MED ORDER — PROPOFOL 10 MG/ML IV BOLUS
INTRAVENOUS | Status: AC
  Filled 2017-06-24: qty 40

## 2017-06-24 MED ORDER — LACTATED RINGERS IV SOLN
INTRAVENOUS | Status: DC
  Administered 2017-06-24: 09:00:00 via INTRAVENOUS

## 2017-06-24 SURGICAL SUPPLY — 21 items

## 2017-06-24 NOTE — Discharge Instructions (Signed)
° °  The polyps look like they are gone.  I recommend we look again in 3 years.  I appreciate the opportunity to care for you. Gatha Mayer, MD, FACG]  YOU HAD AN ENDOSCOPIC PROCEDURE TODAY: Refer to the procedure report and other information in the discharge instructions given to you for any specific questions about what was found during the examination. If this information does not answer your questions, please call [place] office at [phone] to clarify.   YOU SHOULD EXPECT: Some feelings of bloating in the abdomen. Passage of more gas than usual. Walking can help get rid of the air that was put into your GI tract during the procedure and reduce the bloating. If you had a lower endoscopy (such as a colonoscopy or flexible sigmoidoscopy) you may notice spotting of blood in your stool or on the toilet paper. Some abdominal soreness may be present for a day or two, also.  DIET: Your first meal following the procedure should be a light meal and then it is ok to progress to your normal diet. A half-sandwich or bowl of soup is an example of a good first meal. Heavy or fried foods are harder to digest and may make you feel nauseous or bloated. Drink plenty of fluids but you should avoid alcoholic beverages for 24 hours.   ACTIVITY: Your care partner should take you home directly after the procedure. You should plan to take it easy, moving slowly for the rest of the day. You can resume normal activity the day after the procedure however YOU SHOULD NOT DRIVE, use power tools, machinery or perform tasks that involve climbing or major physical exertion for 24 hours (because of the sedation medicines used during the test).   SYMPTOMS TO REPORT IMMEDIATELY: A gastroenterologist can be reached at any hour. Please call [phone]  for any of the following symptoms:  Following lower endoscopy (colonoscopy, flexible sigmoidoscopy) Excessive amounts of blood in the stool  Significant tenderness,  worsening of abdominal pains  Swelling of the abdomen that is new, acute  Fever of 100 or higher    FOLLOW UP:  If any biopsies were taken you will be contacted by phone or by letter within the next 1-3 weeks. Call [phone]  if you have not heard about the biopsies in 3 weeks.  Please also call with any specific questions about appointments or follow up tests.

## 2017-06-24 NOTE — Anesthesia Procedure Notes (Signed)
Procedure Name: MAC Date/Time: 06/24/2017 9:17 AM Performed by: Lissa Morales Pre-anesthesia Checklist: Patient identified, Emergency Drugs available, Suction available and Patient being monitored Patient Re-evaluated:Patient Re-evaluated prior to induction Oxygen Delivery Method: Simple face mask Preoxygenation: Pre-oxygenation with 100% oxygen Tube type: Oral Placement Confirmation: positive ETCO2 Tube secured with: Tape Dental Injury: Teeth and Oropharynx as per pre-operative assessment

## 2017-06-24 NOTE — Anesthesia Postprocedure Evaluation (Signed)
Anesthesia Post Note  Patient: Nancy Cuevas  Procedure(s) Performed: Procedure(s) (LRB): COLONOSCOPY WITH PROPOFOL (N/A) HOT HEMOSTASIS (ARGON PLASMA COAGULATION/BICAP) (N/A)     Patient location during evaluation: PACU Anesthesia Type: MAC Level of consciousness: awake and alert Pain management: pain level controlled Vital Signs Assessment: post-procedure vital signs reviewed and stable Respiratory status: spontaneous breathing, nonlabored ventilation and respiratory function stable Cardiovascular status: stable and blood pressure returned to baseline Anesthetic complications: no    Last Vitals:  Vitals:   06/24/17 0950 06/24/17 1000  BP: (!) 102/45 (!) 115/57  Pulse: (!) 46 (!) 53  Resp: 17 16  Temp:    SpO2: 100% 99%    Last Pain:  Vitals:   06/24/17 0944  TempSrc: Oral                 Audry Pili

## 2017-06-24 NOTE — Transfer of Care (Signed)
Immediate Anesthesia Transfer of Care Note  Patient: Nancy Cuevas  Procedure(s) Performed: Procedure(s): COLONOSCOPY WITH PROPOFOL (N/A) HOT HEMOSTASIS (ARGON PLASMA COAGULATION/BICAP) (N/A)  Patient Location: PACU  Anesthesia Type:MAC  Level of Consciousness: awake, alert , oriented and patient cooperative  Airway & Oxygen Therapy: Patient Spontanous Breathing and Patient connected to face mask oxygen  Post-op Assessment: Report given to RN, Post -op Vital signs reviewed and stable and Patient moving all extremities X 4  Post vital signs: stable  Last Vitals:  Vitals:   06/24/17 0834 06/24/17 0944  BP: (!) 137/48 (!) 86/42  Pulse: (!) 47 (!) 44  Resp: 16 15  Temp: 36.5 C   SpO2: 100% 100%    Last Pain:  Vitals:   06/24/17 0944  TempSrc: Oral         Complications: No apparent anesthesia complications

## 2017-06-24 NOTE — H&P (Signed)
[facility] History and Physical   Primary Care Physician:  Ann Held, DO   Reason for Procedure:   Close follow-up of colon polyps removed February - ensure complete removal  Plan:    Colonoscopy, possible biopsy, polypectomy, ablation. The risks and benefits as well as alternatives of endoscopic procedure(s) have been discussed and reviewed. All questions answered. The patient agrees to proceed.   HPI: Nancy Cuevas is a 71 y.o. female s/p colonoscopy 2/18 w/ large ascending and rectal polyps removed - ? Completely. Here for f/u examination of colon and possible therapy as above.   Past Medical History:  Diagnosis Date  . 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 Bunion   . Contact dermatitis and other eczema, due to unspecified cause   . Contact dermatitis and other eczema, due to unspecified cause   . Disorder of bone and cartilage, unspecified   . Female stress incontinence   . HYPERLIPIDEMIA 10/27/2010  . Hyperpotassemia   . HYPOTHYROIDISM 07/18/2007  . Nonspecific (abnormal) findings on radiological and other examination of other intrathoracic organs   . OSTEOPENIA 03/24/2008  . Palpitations    Echo (1/12) EF 06-30%, mild diastolic dysfunction, atrial septal aneurysm. Holter (1/12): short runs of atrial tachycardia versus MAT. ;  Echo (14) EF 55-60%, normal wall motion, mild MR, mild LAE, atrial septal aneurysm  . Personal history of colonic and rectal adenomas 03/22/2013  . POSTMENOPAUSAL STATUS 02/07/2008  . Symptomatic menopausal or female climacteric states     Past Surgical History:  Procedure Laterality Date  . ABDOMINAL HYSTERECTOMY  1988   prolapse uterus  . CARPAL TUNNEL RELEASE  11/15/2011   Procedure: CARPAL TUNNEL RELEASE;  Surgeon: Tennis Must, MD;  Location: [facility];  Service: Orthopedics;  Laterality: Left;  . CATARACT EXTRACTION  2011  . COLONOSCOPY  1601,0932   normal,  . FOOT SURGERY Right 10/2014  .  FRACTURE SURGERY  june 2006   left wrist  . HAND SURGERY  oct 2012 and jan 2013   Dr Dean--[facility]  . left wrist hardware removal 10/12  10/12  . natural birth  1962, 1972  . PARTIAL PROCTECTOMY BY TEM  06/22/2013   Procedure: PARTIAL PROCTECTOMY BY TEM;  Surgeon: Leighton Ruff, MD;  Location: [facility];  Service: General;;  . RETINAL DETACHMENT SURGERY  2009   left    Prior to Admission medications   Medication Sig Start Date End Date Taking? Authorizing Provider  acetaminophen (TYLENOL) 500 MG tablet Take 500 mg by mouth daily as needed for moderate pain or headache.   Yes [provider]  aspirin 81 MG tablet Take 81 mg by mouth every evening.     Yes [provider]  bisacodyl (BISACODYL) 5 MG EC tablet Take 5 mg by mouth as directed. Dulcolax as directed for colonoscopy prep   Yes [provider]  CALCIUM-MAGNESIUM-VITAMIN D PO Take 1 tablet by mouth daily.   Yes [provider]  ferrous sulfate 325 (65 FE) MG tablet Take 325 mg by mouth daily.   Yes [provider]  Flaxseed, Linseed, (FLAX SEED OIL) 1000 MG CAPS Take 1,000 mg by mouth daily.    Yes [provider]  fluticasone (FLONASE) 50 MCG/ACT nasal spray Place 2 sprays into both nostrils daily. Patient taking differently: Place 1 spray into both nostrils daily as needed for allergies.  01/23/16  Yes Saguier, Percell Miller, PA-C  Hypromellose (ARTIFICIAL TEARS OP) Apply 1  drop to eye daily as needed (dry eyes).   Yes [provider]  levothyroxine (SYNTHROID, LEVOTHROID) 50 MCG tablet take 1 tablet by mouth once daily Patient taking differently: Take 50 mcg by mouth daily before breakfast.  03/17/17  Yes Carollee Herter, Alferd Apa, DO  metoprolol tartrate (LOPRESSOR) 25 MG tablet take 1/2 tablet by mouth twice a day MAY TAKE AN EXTRA 1/2 TABLET FOR PALPITATIONS IF NEEDED Patient taking differently: take 1/2 tablet by mouth twice a day 03/17/17  Yes Ann Held, DO   Multiple Vitamin (MULTIVITAMIN) tablet Take 1 tablet by mouth daily.     Yes [provider]  neomycin-bacitracin-polymyxin (NEOSPORIN) ointment Apply 1 application topically daily as needed for wound care.   Yes [provider]  Omega-3 Fatty Acids (FISH OIL) 1000 MG CAPS Take 1 capsule by mouth 2 (two) times daily.    Yes [provider]  Polyethylene Glycol 3350 (MIRALAX PO) Take 17 g by mouth daily as needed (constipation).    Yes [provider]  Polyethylene Glycol 3350 (MIRALAX PO) Take by mouth as directed. Miralax 238 Grams as directed for colonoscopy prep   Yes [provider]  pravastatin (PRAVACHOL) 40 MG tablet Take 1 tablet (40 mg total) by mouth every evening. 03/17/17  Yes Ann Held, DO    Current Facility-Administered Medications  Medication Dose Route Frequency Provider Last Rate Last Dose  . 0.9 %  sodium chloride infusion   Intravenous Continuous Gatha Mayer, MD      . lactated ringers infusion   Intravenous Continuous Gatha Mayer, MD        Allergies as of 05/04/2017 - Review Complete 03/17/2017  Allergen Reaction Noted  . Sulfonamide derivatives Rash     Family History  Problem Relation Age of Onset  . Diabetes Mother        1st degree relative  . Hypertension Mother   . Stroke Mother   . Heart disease Father   . Breast cancer Unknown        1st degree relative <50  . Osteoporosis Unknown        1st degree relative  . Cancer Maternal Aunt   . Cancer Cousin   . Heart disease Cousin   . Alzheimer's disease Cousin   . Colon cancer Neg Hx     Social History   Social History  . Marital status: Married   Social History Main Topics  . Smoking status: Never Smoker  . Smokeless tobacco: Never Used  . Alcohol use No  . Drug use: No  . Sexual activity: No    Social History Narrative   Exercise-- not regular     Review of Systems: All other review of systems negative except as mentioned  in the HPI.  Physical Exam: Vital signs in last 24 hours: Temp:  [97.7 F (36.5 C)] 97.7 F (36.5 C) (09/07 0834) Pulse Rate:  [47] 47 (09/07 0834) Resp:  [16] 16 (09/07 0834) BP: (137)/(48) 137/48 (09/07 0834) SpO2:  [100 %] 100 % (09/07 0834) Weight:  [142 lb (64.4 kg)] 142 lb (64.4 kg) (09/07 0833)   General:   Alert,  Well-developed, well-nourished, pleasant and cooperative in NAD Lungs:  Clear throughout to auscultation.   Heart:  Regular rate and rhythm; no murmurs, clicks, rubs,  or gallops. Abdomen:  Soft, nontender and nondistended. Normal bowel sounds.   Neuro/Psych:  Alert and cooperative. Normal mood and affect. A and O x 3   @  Gatha Mayer, MD, [facility] [phone] (pager) 06/24/2017 8:54 AM@

## 2017-06-24 NOTE — Anesthesia Preprocedure Evaluation (Addendum)
Anesthesia Evaluation  Patient identified by MRN, date of birth, ID band Patient awake    Reviewed: Allergy & Precautions, H&P , NPO status   History of Anesthesia Complications Negative for: history of anesthetic complications  Airway Mallampati: I  TM Distance: >3 FB Neck ROM: Full    Dental  (+) Dental Advisory Given   Pulmonary neg pulmonary ROS,    Pulmonary exam normal breath sounds clear to auscultation       Cardiovascular Normal cardiovascular exam+ dysrhythmias  Rhythm:Regular Rate:Normal - Systolic murmurs TTE 3646 - Left ventricle: Estimatedejection fraction was in the range of 55% to 60%.  - Mitral valve: Mild regurgitation. - Left atrium: The atrium was mildly dilated. - Atrial septum: There was an atrial septal aneurysm.   Neuro/Psych negative neurological ROS  negative psych ROS   GI/Hepatic negative GI ROS, Neg liver ROS,   Endo/Other  Hypothyroidism   Renal/GU negative Renal ROS     Musculoskeletal negative musculoskeletal ROS (+)   Abdominal   Peds  Hematology negative hematology ROS (+)   Anesthesia Other Findings   Reproductive/Obstetrics                            Anesthesia Physical  Anesthesia Plan  ASA: II  Anesthesia Plan: MAC   Post-op Pain Management:    Induction: Intravenous  PONV Risk Score and Plan: Propofol infusion and Treatment may vary due to age or medical condition  Airway Management Planned: Nasal Cannula  Additional Equipment: None  Intra-op Plan:   Post-operative Plan:   Informed Consent: I have reviewed the patients History and Physical, chart, labs and discussed the procedure including the risks, benefits and alternatives for the proposed anesthesia with the patient or authorized representative who has indicated his/her understanding and acceptance.   Dental advisory given  Plan Discussed with: CRNA  Anesthesia Plan  Comments:         Anesthesia Quick Evaluation

## 2017-06-24 NOTE — Op Note (Signed)
[facility] Patient Name: Nancy Cuevas Procedure Date: 06/24/2017 MRN: [id number] Attending MD: Gatha Mayer , MD Date of Birth: 06-30-1946 CSN: [id number] Age: 71 Admit Type: Outpatient Procedure:                Colonoscopy Indications:              Surveillance: Personal history of piecemeal removal                            of large sessile adenoma on last colonoscopy (less                            than 1 year ago) Providers:                Gatha Mayer, MD, Laverta Baltimore RN, RN,                            Alan Mulder, Technician Referring MD:              Medicines:                Propofol per Anesthesia, Monitored Anesthesia Care Complications:            No immediate complications. Estimated Blood Loss:     Estimated blood loss: none. Procedure:                Pre-Anesthesia Assessment:                           - Prior to the procedure, a History and Physical                            was performed, and patient medications and                            allergies were reviewed. The patient's tolerance of                            previous anesthesia was also reviewed. The risks                            and benefits of the procedure and the sedation                            options and risks were discussed with the patient.                            All questions were answered, and informed consent                            was obtained. Prior Anticoagulants: The patient has                            taken no previous anticoagulant or antiplatelet  agents. ASA Grade Assessment: II - A patient with                            mild systemic disease. After reviewing the risks                            and benefits, the patient was deemed in                            satisfactory condition to undergo the procedure.                           After obtaining informed consent, the colonoscope                            was  passed under direct vision. Throughout the                            procedure, the patient's blood pressure, pulse, and                            oxygen saturations were monitored continuously. The                            EC-[id number] ([id number]) scope was introduced through                            the anus and advanced to the the cecum, identified                            by appendiceal orifice and ileocecal valve. The                            colonoscopy was performed without difficulty. The                            patient tolerated the procedure well. The quality                            of the bowel preparation was excellent. The bowel                            preparation used was Miralax. The ileocecal valve,                            appendiceal orifice, and rectum were photographed. Scope In: 9:20:20 AM Scope Out: 9:36:40 AM Scope Withdrawal Time: 0 hours 9 minutes 6 seconds  Total Procedure Duration: 0 hours 16 minutes 20 seconds  Findings:      The perianal and digital rectal examinations were normal.      A medium post polypectomy scar was found in the rectum. The scar tissue       was healthy in appearance. There was no evidence of the previous polyp.      The  exam was otherwise without abnormality on direct and retroflexion       views. Impression:               - Post-polypectomy scar in the rectum. S/p                            transanal excision yrs ago and polypectomy 11/2016.                            No residual polyp seen                           - The examination was otherwise normal on direct                            and retroflexion views. No signs of ascending colon                            polyp removed 11/2016                           - No specimens collected. Moderate Sedation:      N/A- Per Anesthesia Care Recommendation:           - Patient has a contact number available for                            emergencies. The signs and symptoms  of potential                            delayed complications were discussed with the                            patient. Return to normal activities tomorrow.                            Written discharge instructions were provided to the                            patient.                           - Resume previous diet.                           - Continue present medications.                           - Repeat colonoscopy in 3 years for surveillance. Procedure Code(s):        --- Professional ---                           [phone], Colonoscopy, flexible; diagnostic, including                            collection of specimen(s) by brushing or washing,  when performed (separate procedure) Diagnosis Code(s):        --- Professional ---                           [phone], Other specified postprocedural states                           Z09, Encounter for follow-up examination after                            completed treatment for conditions other than                            malignant neoplasm                           Z86.010, Personal history of colonic polyps CPT copyright 2016 [employer]. All rights reserved. The codes documented in this report are preliminary and upon coder review may  be revised to meet current compliance requirements. Gatha Mayer, MD 06/24/2017 9:46:20 AM This report has been signed electronically. Number of Addenda: 0

## 2017-06-27 ENCOUNTER — Encounter (HOSPITAL_COMMUNITY): Payer: Self-pay | Admitting: Internal Medicine

## 2017-07-06 ENCOUNTER — Ambulatory Visit
Admission: RE | Admit: 2017-07-06 | Discharge: 2017-07-06 | Disposition: A | Discharge status: Home / Self Care | Payer: Medicare Other | Source: Ambulatory Visit | Attending: Family Medicine | Admitting: Family Medicine

## 2017-07-06 DIAGNOSIS — E2839 Other primary ovarian failure: Secondary | ICD-10-CM

## 2018-03-16 NOTE — Progress Notes (Deleted)
Subjective:   Nancy Cuevas is a 72 y.o. female who presents for Medicare Annual (Subsequent) preventive examination.  Review of Systems:  No ROS.  Medicare Wellness Visit. Additional risk factors are reflected in the social history.   Sleep patterns:  Home Safety/Smoke Alarms: Feels safe in home. Smoke alarms in place.  Living environment; residence and Firearm Safety:    Female:        Mammo- utd      Dexa scan- utd       CCS- due 06/25/2020  Objective:     Vitals: There were no vitals taken for this visit.  There is no height or weight on file to calculate BMI.  Advanced Directives 06/24/2017 06/10/2017 05/18/2017 03/17/2017 06/15/2013 11/15/2011  Does Patient Have a Medical Advance Directive? Yes Yes Yes Yes Patient has advance directive, copy not in chart -  Type of Advance Directive [place];Living will Living will;Healthcare Power of Attorney Living will;Healthcare Power of [place];Living will [place];Living will -  Does patient want to make changes to medical advance directive? - No - Patient declined - - - -  Copy of [employer] in Chart? No - copy requested No - copy requested - No - copy requested Copy requested from family -  Pre-existing out of facility DNR order (yellow form or pink MOST form) - - - - No No    Tobacco Social History   Tobacco Use  Smoking Status Never Smoker  Smokeless Tobacco Never Used     Counseling given: Not Answered   Clinical Intake:                       Past Medical History:  Diagnosis Date  . 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 Bunion   . Contact dermatitis and other eczema, due to unspecified cause   . Contact dermatitis and other eczema, due to unspecified cause   . Disorder of bone and cartilage, unspecified   . Female stress incontinence   . HYPERLIPIDEMIA 10/27/2010  . Hyperpotassemia   . HYPOTHYROIDISM  07/18/2007  . Nonspecific (abnormal) findings on radiological and other examination of other intrathoracic organs   . OSTEOPENIA 03/24/2008  . Palpitations    Echo (1/12) EF 83-15%, mild diastolic dysfunction, atrial septal aneurysm. Holter (1/12): short runs of atrial tachycardia versus MAT. ;  Echo (14) EF 55-60%, normal wall motion, mild MR, mild LAE, atrial septal aneurysm  . Personal history of colonic and rectal adenomas 03/22/2013  . POSTMENOPAUSAL STATUS 02/07/2008  . Symptomatic menopausal or female climacteric states    Past Surgical History:  Procedure Laterality Date  . ABDOMINAL HYSTERECTOMY  1988   prolapse uterus  . CARPAL TUNNEL RELEASE  11/15/2011   Procedure: CARPAL TUNNEL RELEASE;  Surgeon: Tennis Must, MD;  Location: [facility];  Service: Orthopedics;  Laterality: Left;  . CATARACT EXTRACTION  2011  . COLONOSCOPY  [id number]   normal,  . COLONOSCOPY WITH PROPOFOL N/A 06/24/2017   Procedure: COLONOSCOPY WITH PROPOFOL;  Surgeon: Gatha Mayer, MD;  [facility];  Service: Endoscopy;  Laterality: N/A;  . FOOT SURGERY Right 10/2014  . FRACTURE SURGERY  june 2006   left wrist  . HAND SURGERY  oct 2012 and jan 2013   Dr Dean--[facility]  . HOT HEMOSTASIS N/A 06/24/2017   Procedure: HOT HEMOSTASIS (ARGON PLASMA COAGULATION/BICAP);  Surgeon: Silvano Rusk  E, MD;  [facility];  Service: Endoscopy;  Laterality: N/A;  . left wrist hardware removal 10/12  10/12  . natural birth  1962[birth year] 1972  . PARTIAL PROCTECTOMY BY TEM  06/22/2013   Procedure: PARTIAL PROCTECTOMY BY TEM;  Surgeon: Leighton Ruff, MD;  [facility];  Service: General;;  . RETINAL DETACHMENT SURGERY  2009   left   Family History  Problem Relation Age of Onset  . Diabetes Mother        1st degree relative  . Hypertension Mother   . Stroke Mother   . Heart disease Father   . Breast cancer Unknown        1st degree relative <50  . Osteoporosis Unknown        1st degree  relative  . Cancer Maternal Aunt   . Cancer Cousin   . Heart disease Cousin   . Alzheimer's disease Cousin   . Colon cancer Neg Hx    Social History   Socioeconomic History  . Marital status: Married    Spouse name: Not on file  . Number of children: Not on file  . Years of education: Not on file  . Highest education level: Not on file  Occupational History  . Not on file  Social Needs  . Financial resource strain: Not on file  . Food insecurity:    Worry: Not on file    Inability: Not on file  . Transportation needs:    Medical: Not on file    Non-medical: Not on file  Tobacco Use  . Smoking status: Never Smoker  . Smokeless tobacco: Never Used  Substance and Sexual Activity  . Alcohol use: No  . Drug use: No  . Sexual activity: Never  Lifestyle  . Physical activity:    Days per week: Not on file    Minutes per session: Not on file  . Stress: Not on file  Relationships  . Social connections:    Talks on phone: Not on file    Gets together: Not on file    Attends religious service: Not on file    Active member of club or organization: Not on file    Attends meetings of clubs or organizations: Not on file    Relationship status: Not on file  Other Topics Concern  . Not on file  Social History Narrative   Exercise-- not regular     Outpatient Encounter Medications as of 03/20/2018  Medication Sig  . acetaminophen (TYLENOL) 500 MG tablet Take 500 mg by mouth daily as needed for moderate pain or headache.  Marland Kitchen aspirin 81 MG tablet Take 81 mg by mouth every evening.    Marland Kitchen CALCIUM-MAGNESIUM-VITAMIN D PO Take 1 tablet by mouth daily.  . ferrous sulfate 325 (65 FE) MG tablet Take 325 mg by mouth daily.  . Flaxseed, Linseed, (FLAX SEED OIL) 1000 MG CAPS Take 1,000 mg by mouth daily.   . fluticasone (FLONASE) 50 MCG/ACT nasal spray Place 2 sprays into both nostrils daily. (Patient taking differently: Place 1 spray into both nostrils daily as needed for allergies. )  .  Hypromellose (ARTIFICIAL TEARS OP) Apply 1 drop to eye daily as needed (dry eyes).  Marland Kitchen levothyroxine (SYNTHROID, LEVOTHROID) 50 MCG tablet take 1 tablet by mouth once daily (Patient taking differently: Take 50 mcg by mouth daily before breakfast. )  . metoprolol tartrate (LOPRESSOR) 25 MG tablet take 1/2 tablet by mouth twice a day MAY TAKE AN EXTRA 1/2 TABLET  FOR PALPITATIONS IF NEEDED (Patient taking differently: take 1/2 tablet by mouth twice a day)  . Multiple Vitamin (MULTIVITAMIN) tablet Take 1 tablet by mouth daily.    Marland Kitchen neomycin-bacitracin-polymyxin (NEOSPORIN) ointment Apply 1 application topically daily as needed for wound care.  . Omega-3 Fatty Acids (FISH OIL) 1000 MG CAPS Take 1 capsule by mouth 2 (two) times daily.   . Polyethylene Glycol 3350 (MIRALAX PO) Take 17 g by mouth daily as needed (constipation).   . pravastatin (PRAVACHOL) 40 MG tablet Take 1 tablet (40 mg total) by mouth every evening.   No facility-administered encounter medications on file as of 03/20/2018.     Activities of Daily Living In your present state of health, do you have any difficulty performing the following activities: 03/17/2017  Hearing? N  Vision? N  Difficulty concentrating or making decisions? N  Walking or climbing stairs? N  Dressing or bathing? N  Doing errands, shopping? N  Preparing Food and eating ? N  Using the Toilet? N  Managing your Medications? N  Managing your Finances? N  Housekeeping or managing your Housekeeping? N  Some recent data might be hidden    Patient Care Team: Carollee Herter, Alferd Apa, DO as PCP - General Carlean Purl Ofilia Neas, MD as Consulting Physician (Gastroenterology) Marygrace Drought, MD as Consulting Physician (Ophthalmology) Larey Dresser, MD as Consulting Physician (Cardiology) Ladell Pier, DDS as Consulting Physician (Dentistry)    Assessment:   This is a routine wellness examination for [facility]. Physical assessment deferred to PCP.  Exercise Activities and  Dietary recommendations   Diet (meal preparation, eat out, water intake, caffeinated beverages, dairy products, fruits and vegetables): {Desc; diets:16563} Breakfast: Lunch:  Dinner:      Goals    None      Fall Risk Fall Risk  03/17/2017 05/25/2016 10/24/2015 02/21/2015 02/12/2014  Falls in the past year? No No No No No     Depression Screen PHQ 2/9 Scores 03/17/2017 05/25/2016 10/24/2015 02/21/2015  PHQ - 2 Score 0 0 0 0     Cognitive Function MMSE - Mini Mental State Exam 03/17/2017  Orientation to time 5  Orientation to Place 5  Registration 3  Attention/ Calculation 5  Recall 3  Language- name 2 objects 2  Language- repeat 1  Language- follow 3 step command 3  Language- read & follow direction 1  Write a sentence 1  Copy design 1  Total score 30        Immunization History  Administered Date(s) Administered  . Influenza Split 09/17/2011  . Influenza Whole 08/30/2007, 07/31/2008, 09/02/2010, 09/01/2012  . Influenza, High Dose Seasonal PF 07/11/2013, 07/31/2014, 07/22/2015, 07/19/2016, 07/09/2017  . Pneumococcal Conjugate-13 07/31/2014  . Pneumococcal Polysaccharide-23 09/21/2010, 07/19/2016  . Td 02/07/2008  . Zoster 02/07/2008    Screening Tests Health Maintenance  Topic Date Due  . TETANUS/TDAP  02/06/2018  . INFLUENZA VACCINE  05/18/2018  . COLONOSCOPY  06/24/2018  . MAMMOGRAM  05/19/2019  . DEXA SCAN  Completed  . Hepatitis C Screening  Completed  . PNA vac Low Risk Adult  Completed   Plan:   ***   I have personally reviewed and noted the following in the patient's chart:   . Medical and social history . Use of alcohol, tobacco or illicit drugs  . Current medications and supplements . Functional ability and status . Nutritional status . Physical activity . Advanced directives . List of other physicians . Hospitalizations, surgeries, and ER visits in previous 12 months .  Vitals . Screenings to include cognitive, depression, and falls . Referrals  and appointments  In addition, I have reviewed and discussed with patient certain preventive protocols, quality metrics, and best practice recommendations. A written personalized care plan for preventive services as well as general preventive health recommendations were provided to patient.     Shela Nevin, [place]  03/16/2018

## 2018-03-20 ENCOUNTER — Ambulatory Visit: Payer: Medicare Other | Admitting: *Deleted

## 2018-03-20 ENCOUNTER — Encounter: Payer: Medicare Other | Admitting: Family Medicine

## 2018-04-14 ENCOUNTER — Other Ambulatory Visit: Payer: Self-pay | Admitting: Family Medicine

## 2018-04-14 DIAGNOSIS — E039 Hypothyroidism, unspecified: Secondary | ICD-10-CM

## 2018-04-14 DIAGNOSIS — E785 Hyperlipidemia, unspecified: Secondary | ICD-10-CM

## 2018-05-17 ENCOUNTER — Other Ambulatory Visit: Payer: Self-pay | Admitting: Family Medicine

## 2018-05-17 DIAGNOSIS — Z1231 Encounter for screening mammogram for malignant neoplasm of breast: Secondary | ICD-10-CM

## 2018-05-30 ENCOUNTER — Telehealth: Payer: Self-pay | Admitting: Family Medicine

## 2018-05-30 NOTE — Telephone Encounter (Signed)
Fine with me

## 2018-05-30 NOTE — Telephone Encounter (Signed)
Dr. Etter Sjogren  Ms Nancy Cuevas ([id number]-MRN [id number]) came in the office today to [facility] from your location to [place] to be seen by Dr. Deborra Medina because it is closer to her home.  She advise that she  will still come in for her appt with you on Monday but going [place] she would like to be seen at [facility].  Please advise if it is ok for the TOC.   Thanks

## 2018-05-31 NOTE — Telephone Encounter (Signed)
Okay with me 

## 2018-05-31 NOTE — Telephone Encounter (Signed)
Patient has been approved to [facility] Debor[facility] Med[facility]. I called and left message on patient voicemail to call office and schedule appointment to see Dr. Deborra Medina. *[facility] - ok to schedule appointment when patient returns call, patient will be a TOC appointment with Dr. Deborra Medina.

## 2018-05-31 NOTE — Telephone Encounter (Signed)
Okay to schedule

## 2018-06-02 NOTE — Progress Notes (Signed)
Subjective:   Nancy Cuevas is a 72 y.o. female who presents for Medicare Annual (Subsequent) preventive examination.  Pt enjoys square dancing and church activities.  Review of Systems: No ROS.  Medicare Wellness Visit. Additional risk factors are reflected in the social history. Cardiac Risk Factors include: advanced age (>28mn, >>21women);dyslipidemia Sleep patterns: Slightly restless sleep. Feels rested. Home Safety/Smoke Alarms: Feels safe in home. Smoke alarms in place.  Living environment; residence and Firearm Safety: Lives on one story story with husband.   Female:         Mammo-scheduled 06/12/18       Dexa scan-utd CCS- due 2021     Objective:     Vitals: BP (!) 130/58 (BP Location: Right Arm, Patient Position: Sitting, Cuff Size: Normal)   Pulse (!) 58   Ht _0  (1.626 m)   Wt 143 lb (64.9 kg)   SpO2 99%   BMI 24.55 kg/m   Body mass index is 24.55 kg/m.  Advanced Directives 06/05/2018 06/24/2017 06/10/2017 05/18/2017 03/17/2017 06/15/2013 11/15/2011  Does Patient Have a Medical Advance Directive? _1  Patient has advance directive, copy not in chart -  [place];Healthcare Power of Attorney Living will;[place]care [place] -  Does patient want to make changes to medical advance directive? - - No - Patient declined - - - -  [place] of [place]? Yes No - copy requested No - copy requested - No - copy requested Copy requested from family -  Pre-existing out of facility DNR order (yellow form or pink MOST form) - - - - - No No    Tobacco Social History   Tobacco Use  Smoking Status Never Smoker  Smokeless Tobacco Never Used     Counseling given: Not Answered   Clinical Intake: Pain : No/denies pain      Past Medical  History:  Diagnosis Date  . 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 Bunion   . Contact dermatitis and other eczema, due to unspecified cause   . Contact dermatitis and other eczema, due to unspecified cause   . Disorder of bone and cartilage, unspecified   . Female stress incontinence   . HYPERLIPIDEMIA 10/27/2010  . Hyperpotassemia   . HYPOTHYROIDISM 07/18/2007  . Nonspecific (abnormal) findings on radiological and other examination of other intrathoracic organs   . OSTEOPENIA 03/24/2008  . Palpitations    Echo (1/12) EF 574-12% mild diastolic dysfunction, atrial septal aneurysm. Holter (1/12): short runs of atrial tachycardia versus MAT. ;  Echo (14) EF 55-60%, normal wall motion, mild MR, mild LAE, atrial septal aneurysm  . Personal history of colonic and rectal adenomas 03/22/2013  . POSTMENOPAUSAL STATUS 02/07/2008  . Symptomatic menopausal or female climacteric states    Past Surgical History:  Procedure Laterality Date  . ABDOMINAL HYSTERECTOMY  1988   prolapse uterus  . CARPAL TUNNEL RELEASE  11/15/2011   Procedure: CARPAL TUNNEL RELEASE;  Surgeon: KTennis Must MD;  [facility]  Service: Orthopedics;  Laterality: Left;  . CATARACT EXTRACTION  2011  . COLONOSCOPY  [id number]  normal,  . COLONOSCOPY WITH PROPOFOL N/A 06/24/2017   Procedure: COLONOSCOPY WITH PROPOFOL;  Surgeon: GGatha Mayer MD;  [facility];  Service: Endoscopy;  Laterality: N/A;  .  FOOT SURGERY Right 10/2014  . FRACTURE SURGERY  june 2006   left wrist  . HAND SURGERY  oct 2012 and jan 2013   Dr Dean--[facility]  . HOT HEMOSTASIS N/A 06/24/2017   Procedure: HOT HEMOSTASIS (ARGON PLASMA COAGULATION/BICAP);  Surgeon: Gatha Mayer, MD;  Location: [facility];  Service: Endoscopy;  Laterality: N/A;  . left wrist hardware removal 10/12  10/12  . natural birth  1962[birth year] 1972  . PARTIAL PROCTECTOMY BY TEM  06/22/2013   Procedure: PARTIAL PROCTECTOMY BY TEM;   Surgeon: Leighton Ruff, MD;  [facility];  Service: General;;  . RETINAL DETACHMENT SURGERY  2009   left   Family History  Problem Relation Age of Onset  . Diabetes Mother        1st degree relative  . Hypertension Mother   . Stroke Mother   . Heart disease Father   . Breast cancer Unknown        1st degree relative <50  . Osteoporosis Unknown        1st degree relative  . Cancer Maternal Aunt   . Cancer Cousin   . Heart disease Cousin   . Alzheimer's disease Cousin   . Colon cancer Neg Hx    Social History   Socioeconomic History  . Marital status: Married    Spouse name: Not on file  . Number of children: Not on file  . Years of education: Not on file  . Highest education level: Not on file  Occupational History  . Not on file  Social Needs  . Financial resource strain: Not on file  . Food insecurity:    Worry: Not on file    Inability: Not on file  . Transportation needs:    Medical: Not on file    Non-medical: Not on file  Tobacco Use  . Smoking status: Never Smoker  . Smokeless tobacco: Never Used  Substance and Sexual Activity  . Alcohol use: No  . Drug use: No  . Sexual activity: Not Currently  Lifestyle  . Physical activity:    Days per week: Not on file    Minutes per session: Not on file  . Stress: Not on file  Relationships  . Social connections:    Talks on phone: Not on file    Gets together: Not on file    Attends religious service: Not on file    Active member of club or organization: Not on file    Attends meetings of clubs or organizations: Not on file    Relationship status: Not on file  Other Topics Concern  . Not on file  Social History Narrative   Exercise-- not regular     Outpatient Encounter Medications as of 06/05/2018  Medication Sig  . acetaminophen (TYLENOL) 500 MG tablet Take 500 mg by mouth daily as needed for moderate pain or headache.  Marland Kitchen aspirin 81 MG tablet Take 81 mg by mouth every evening.    Marland Kitchen  CALCIUM-MAGNESIUM-VITAMIN D PO Take 1 tablet by mouth daily.  . ferrous sulfate 325 (65 FE) MG tablet Take 325 mg by mouth daily.  . Flaxseed, Linseed, (FLAX SEED OIL) 1000 MG CAPS Take 1,000 mg by mouth daily.   . fluticasone (FLONASE) 50 MCG/ACT nasal spray Place 2 sprays into both nostrils daily. (Patient taking differently: Place 1 spray into both nostrils daily as needed for allergies. )  . Hypromellose (ARTIFICIAL TEARS OP) Apply 1 drop to eye daily as needed (dry eyes).  Marland Kitchen  levothyroxine (SYNTHROID, LEVOTHROID) 50 MCG tablet TAKE 1 TABLET BY MOUTH ONCE DAILY  . metoprolol tartrate (LOPRESSOR) 25 MG tablet take 1/2 tablet by mouth twice a day MAY TAKE AN EXTRA 1/2 TABLET FOR PALPITATIONS IF NEEDED (Patient taking differently: take 1/2 tablet by mouth twice a day)  . Multiple Vitamin (MULTIVITAMIN) tablet Take 1 tablet by mouth daily.    Marland Kitchen neomycin-bacitracin-polymyxin (NEOSPORIN) ointment Apply 1 application topically daily as needed for wound care.  . Omega-3 Fatty Acids (FISH OIL) 1000 MG CAPS Take 1 capsule by mouth 2 (two) times daily.   . Polyethylene Glycol 3350 (MIRALAX PO) Take 17 g by mouth daily as needed (constipation).   . pravastatin (PRAVACHOL) 40 MG tablet TAKE 1 TABLET BY MOUTH EVERY EVENING   No facility-administered encounter medications on file as of 06/05/2018.     Activities of Daily Living In your present state of health, do you have any difficulty performing the following activities: 06/05/2018  Hearing? N  Vision? N  Difficulty concentrating or making decisions? N  Walking or climbing stairs? N  Dressing or bathing? N  Doing errands, shopping? N  Preparing Food and eating ? N  Using the Toilet? N  In the past six months, have you accidently leaked urine? N  Do you have problems with loss of bowel control? N  Managing your Medications? N  Managing your Finances? N  Housekeeping or managing your Housekeeping? N  Some recent data might be hidden    Patient  Care Team: Carollee Herter, Alferd Apa, DO as PCP - General Carlean Purl Ofilia Neas, MD as Consulting Physician (Gastroenterology) Marygrace Drought, MD as Consulting Physician (Ophthalmology) Larey Dresser, MD as Consulting Physician (Cardiology) Ladell Pier, DDS as Consulting Physician (Dentistry)    Assessment:   This is a routine wellness examination for [facility]. Physical assessment deferred to PCP.  Exercise Activities and Dietary recommendations Current Exercise Habits: The patient does not participate in regular exercise at present, Exercise limited by: None identified Diet (meal preparation, eat out, water intake, caffeinated beverages, dairy products, fruits and vegetables):  Breakfast: eggs, meat, toast Lunch: Grilled chicken and veggies Dinner: Big Mac  Goals    . Increase physical activity     Continue current exercise regimen and start going to the [facility] 3x weekly.       Fall Risk Fall Risk  06/05/2018 03/17/2017 05/25/2016 10/24/2015 02/21/2015  Falls in the past year? _0    Depression Screen PHQ 2/9 Scores 06/05/2018 03/17/2017 05/25/2016 10/24/2015  PHQ - 2 Score 0 0 0 0     Cognitive Function Ad8 score reviewed for issues:  Issues making decisions:no  Less interest in hobbies / activities:no  Repeats questions, stories (family complaining):no  Trouble using ordinary gadgets (microwave, computer, phone):no  Forgets the month or year: no  Mismanaging finances: no  Remembering appts:no  Daily problems with thinking and/or memory:no Ad8 score is=0     MMSE - Mini Mental State Exam 03/17/2017  Orientation to time 5  Orientation to Place 5  Registration 3  Attention/ Calculation 5  Recall 3  Language- name 2 objects 2  Language- repeat 1  Language- follow 3 step command 3  Language- read & follow direction 1  Write a sentence 1  Copy design 1  Total score 30        Immunization History  Administered Date(s) Administered  . Influenza Split 09/17/2011    . Influenza Whole 08/30/2007, 07/31/2008, 09/02/2010, 09/01/2012  . Influenza,  High Dose Seasonal PF 07/11/2013, 07/31/2014, 07/22/2015, 07/19/2016, 07/09/2017  . Pneumococcal Conjugate-13 07/31/2014  . Pneumococcal Polysaccharide-23 09/21/2010, 07/19/2016  . Td 02/07/2008  . Zoster 02/07/2008    Screening Tests Health Maintenance  Topic Date Due  . TETANUS/TDAP  02/06/2018  . MAMMOGRAM  05/18/2018  . INFLUENZA VACCINE  05/18/2018  . COLONOSCOPY  06/24/2020  . DEXA SCAN  Completed  . Hepatitis C Screening  Completed  . PNA vac Low Risk Adult  Completed       Plan:    Please schedule your next medicare wellness visit with me in 1 yr.  Continue to eat heart healthy diet (full of fruits, vegetables, whole grains, lean protein, water--limit salt, fat, and sugar intake) and increase physical activity as tolerated.  Please talk to your pharmacy about Shingrix and Tdap.   I have personally reviewed and noted the following in the patient's chart:   . Medical and social history . Use of alcohol, tobacco or illicit drugs  . Current medications and supplements . Functional ability and status . Nutritional status . Physical activity . Advanced directives . List of other physicians . Hospitalizations, surgeries, and ER visits in previous 12 months . Vitals . Screenings to include cognitive, depression, and falls . Referrals and appointments  In addition, I have reviewed and discussed with patient certain preventive protocols, quality metrics, and best practice recommendations. A written personalized care plan for preventive services as well as general preventive health recommendations were provided to patient.     Shela Nevin, [place]  06/05/2018

## 2018-06-05 ENCOUNTER — Encounter: Payer: Self-pay | Admitting: *Deleted

## 2018-06-05 ENCOUNTER — Ambulatory Visit (INDEPENDENT_AMBULATORY_CARE_PROVIDER_SITE_OTHER): Payer: Medicare Other | Admitting: *Deleted

## 2018-06-05 ENCOUNTER — Ambulatory Visit: Payer: Medicare Other | Admitting: Family Medicine

## 2018-06-05 ENCOUNTER — Encounter: Payer: Self-pay | Admitting: Family Medicine

## 2018-06-05 VITALS — BP 130/58 | HR 58 | Temp 98.5°F | Resp 16 | Ht 64.0 in | Wt 141.8 lb

## 2018-06-05 VITALS — BP 130/58 | HR 58 | Ht 64.0 in | Wt 143.0 lb

## 2018-06-05 DIAGNOSIS — Z0000 Encounter for general adult medical examination without abnormal findings: Secondary | ICD-10-CM

## 2018-06-05 DIAGNOSIS — E039 Hypothyroidism, unspecified: Secondary | ICD-10-CM | POA: Diagnosis not present

## 2018-06-05 DIAGNOSIS — R1310 Dysphagia, unspecified: Secondary | ICD-10-CM

## 2018-06-05 DIAGNOSIS — Z0001 Encounter for general adult medical examination with abnormal findings: Secondary | ICD-10-CM | POA: Diagnosis not present

## 2018-06-05 DIAGNOSIS — E785 Hyperlipidemia, unspecified: Secondary | ICD-10-CM

## 2018-06-05 MED ORDER — OMEPRAZOLE 20 MG PO CPDR: 20.0000 mg | DELAYED_RELEASE_CAPSULE | Freq: Every day | ORAL | 3 refills | Status: DC

## 2018-06-05 NOTE — Patient Instructions (Signed)
Please schedule your next medicare wellness visit with me in 1 yr.  Continue to eat heart healthy diet (full of fruits, vegetables, whole grains, lean protein, water--limit salt, fat, and sugar intake) and increase physical activity as tolerated.  Please talk to your pharmacy about Shingrix and Tdap.   Nancy Cuevas , Thank you for taking time to come for your Medicare Wellness Visit. I appreciate your ongoing commitment to your health goals. Please review the following plan we discussed and let me know if I can assist you in the future.   These are the goals we discussed: Goals    . Increase physical activity     Continue current exercise regimen and start going to the [facility] 3x weekly.       This is a list of the screening recommended for you and due dates:  Health Maintenance  Topic Date Due  . Tetanus Vaccine  02/06/2018  . Mammogram  05/18/2018  . Flu Shot  05/18/2018  . Colon Cancer Screening  06/24/2020  . DEXA scan (bone density measurement)  Completed  .  Hepatitis C: One time screening is recommended by Center for Disease Control  (CDC) for  adults born from [birth year] through 1967.   Completed  . Pneumonia vaccines  Completed    Health Maintenance for Postmenopausal Women Menopause is a normal process in which your reproductive ability comes to an end. This process happens gradually over a span of months to years, usually between the ages of 21 and 51. Menopause is complete when you have missed 12 consecutive menstrual periods. It is important to talk with your health care provider about some of the most common conditions that affect postmenopausal women, such as heart disease, cancer, and bone loss (osteoporosis). Adopting a healthy lifestyle and getting preventive care can help to promote your health and wellness. Those actions can also lower your chances of developing some of these common conditions. What should I know about menopause? During menopause, you may experience a  number of symptoms, such as:  Moderate-to-severe hot flashes.  Night sweats.  Decrease in sex drive.  Mood swings.  Headaches.  Tiredness.  Irritability.  Memory problems.  Insomnia.  Choosing to treat or not to treat menopausal changes is an individual decision that you make with your health care provider. What should I know about hormone replacement therapy and supplements? Hormone therapy products are effective for treating symptoms that are associated with menopause, such as hot flashes and night sweats. Hormone replacement carries certain risks, especially as you become older. If you are thinking about using estrogen or estrogen with progestin treatments, discuss the benefits and risks with your health care provider. What should I know about heart disease and stroke? Heart disease, heart attack, and stroke become more likely as you age. This may be due, in part, to the hormonal changes that your body experiences during menopause. These can affect how your body processes dietary fats, triglycerides, and cholesterol. Heart attack and stroke are both medical emergencies. There are many things that you can do to help prevent heart disease and stroke:  Have your blood pressure checked at least every 1-2 years. High blood pressure causes heart disease and increases the risk of stroke.  If you are 28-42 years old, ask your health care provider if you should take aspirin to prevent a heart attack or a stroke.  Do not use any tobacco products, including cigarettes, chewing tobacco, or electronic cigarettes. If you need help quitting, ask your  health care provider.  It is important to eat a healthy diet and maintain a healthy weight. ? Be sure to include plenty of vegetables, fruits, low-fat dairy products, and lean protein. ? Avoid eating foods that are high in solid fats, added sugars, or salt (sodium).  Get regular exercise. This is one of the most important things that you can do  for your health. ? Try to exercise for at least 150 minutes each week. The type of exercise that you do should increase your heart rate and make you sweat. This is known as moderate-intensity exercise. ? Try to do strengthening exercises at least twice each week. Do these in addition to the moderate-intensity exercise.  Know your numbers.Ask your health care provider to check your cholesterol and your blood glucose. Continue to have your blood tested as directed by your health care provider.  What should I know about cancer screening? There are several types of cancer. Take the following steps to reduce your risk and to catch any cancer development as early as possible. Breast Cancer  Practice breast self-awareness. ? This means understanding how your breasts normally appear and feel. ? It also means doing regular breast self-exams. Let your health care provider know about any changes, no matter how small.  If you are 51 or older, have a clinician do a breast exam (clinical breast exam or CBE) every year. Depending on your age, family history, and medical history, it may be recommended that you also have a yearly breast X-ray (mammogram).  If you have a family history of breast cancer, talk with your health care provider about genetic screening.  If you are at high risk for breast cancer, talk with your health care provider about having an MRI and a mammogram every year.  Breast cancer (BRCA) gene test is recommended for women who have family members with BRCA-related cancers. Results of the assessment will determine the need for genetic counseling and BRCA1 and for BRCA2 testing. BRCA-related cancers include these types: ? Breast. This occurs in males or females. ? Ovarian. ? Tubal. This may also be called fallopian tube cancer. ? Cancer of the abdominal or pelvic lining (peritoneal cancer). ? Prostate. ? Pancreatic.  Cervical, Uterine, and Ovarian Cancer Your health care provider may  recommend that you be screened regularly for cancer of the pelvic organs. These include your ovaries, uterus, and vagina. This screening involves a pelvic exam, which includes checking for microscopic changes to the surface of your cervix (Pap test).  For women ages 21-65, health care providers may recommend a pelvic exam and a Pap test every three years. For women ages 9-65, they may recommend the Pap test and pelvic exam, combined with testing for human papilloma virus (HPV), every five years. Some types of HPV increase your risk of cervical cancer. Testing for HPV may also be done on women of any age who have unclear Pap test results.  Other health care providers may not recommend any screening for nonpregnant women who are considered low risk for pelvic cancer and have no symptoms. Ask your health care provider if a screening pelvic exam is right for you.  If you have had past treatment for cervical cancer or a condition that could lead to cancer, you need Pap tests and screening for cancer for at least 20 years after your treatment. If Pap tests have been discontinued for you, your risk factors (such as having a new sexual partner) need to be reassessed to determine if you  should start having screenings again. Some women have medical problems that increase the chance of getting cervical cancer. In these cases, your health care provider may recommend that you have screening and Pap tests more often.  If you have a family history of uterine cancer or ovarian cancer, talk with your health care provider about genetic screening.  If you have vaginal bleeding after reaching menopause, tell your health care provider.  There are currently no reliable tests available to screen for ovarian cancer.  Lung Cancer Lung cancer screening is recommended for adults 40-52 years old who are at high risk for lung cancer because of a history of smoking. A yearly low-dose CT scan of the lungs is recommended if  you:  Currently smoke.  Have a history of at least 30 pack-years of smoking and you currently smoke or have quit within the past 15 years. A pack-year is smoking an average of one pack of cigarettes per day for one year.  Yearly screening should:  Continue until it has been 15 years since you quit.  Stop if you develop a health problem that would prevent you from having lung cancer treatment.  Colorectal Cancer  This type of cancer can be detected and can often be prevented.  Routine colorectal cancer screening usually begins at age 68 and continues through age 70.  If you have risk factors for colon cancer, your health care provider may recommend that you be screened at an earlier age.  If you have a family history of colorectal cancer, talk with your health care provider about genetic screening.  Your health care provider may also recommend using home test kits to check for hidden blood in your stool.  A small camera at the end of a tube can be used to examine your colon directly (sigmoidoscopy or colonoscopy). This is done to check for the earliest forms of colorectal cancer.  Direct examination of the colon should be repeated every 5-10 years until age 67. However, if early forms of precancerous polyps or small growths are found or if you have a family history or genetic risk for colorectal cancer, you may need to be screened more often.  Skin Cancer  Check your skin from head to toe regularly.  Monitor any moles. Be sure to tell your health care provider: ? About any new moles or changes in moles, especially if there is a change in a mole's shape or color. ? If you have a mole that is larger than the size of a pencil eraser.  If any of your family members has a history of skin cancer, especially at a young age, talk with your health care provider about genetic screening.  Always use sunscreen. Apply sunscreen liberally and repeatedly throughout the day.  Whenever you are  outside, protect yourself by wearing long sleeves, pants, a wide-brimmed hat, and sunglasses.  What should I know about osteoporosis? Osteoporosis is a condition in which bone destruction happens more quickly than new bone creation. After menopause, you may be at an increased risk for osteoporosis. To help prevent osteoporosis or the bone fractures that can happen because of osteoporosis, the following is recommended:  If you are 65-62 years old, get at least 1,000 mg of calcium and at least 600 mg of vitamin D per day.  If you are older than age 75 but younger than age 65, get at least 1,200 mg of calcium and at least 600 mg of vitamin D per day.  If you are  older than age 24, get at least 1,200 mg of calcium and at least 800 mg of vitamin D per day.  Smoking and excessive alcohol intake increase the risk of osteoporosis. Eat foods that are rich in calcium and vitamin D, and do weight-bearing exercises several times each week as directed by your health care provider. What should I know about how menopause affects my mental health? Depression may occur at any age, but it is more common as you become older. Common symptoms of depression include:  Low or sad mood.  Changes in sleep patterns.  Changes in appetite or eating patterns.  Feeling an overall lack of motivation or enjoyment of activities that you previously enjoyed.  Frequent crying spells.  Talk with your health care provider if you think that you are experiencing depression. What should I know about immunizations? It is important that you get and maintain your immunizations. These include:  Tetanus, diphtheria, and pertussis (Tdap) booster vaccine.  Influenza every year before the flu season begins.  Pneumonia vaccine.  Shingles vaccine.  Your health care provider may also recommend other immunizations. This information is not intended to replace advice given to you by your health care provider. Make sure you discuss  any questions you have with your health care provider. Document Released: 11/26/2005 Document Revised: 04/23/2016 Document Reviewed: 07/08/2015 Elsevier Interactive Patient Education  2018 [employer].

## 2018-06-05 NOTE — Progress Notes (Signed)
Subjective:      Nancy Cuevas is a 72 y.o. female and is here for a comprehensive physical exam. The patient report trouble swallowing a-- food gets stuck.    She is also here to f/u thyroid, bp, and cholesterol.     Social History   Socioeconomic History  . Marital status: Married    Spouse name: Not on file  . Number of children: Not on file  . Years of education: Not on file  . Highest education level: Not on file  Occupational History  . Not on file  Social Needs  . Financial resource strain: Not on file  . Food insecurity:    Worry: Not on file    Inability: Not on file  . Transportation needs:    Medical: Not on file    Non-medical: Not on file  Tobacco Use  . Smoking status: Never Smoker  . Smokeless tobacco: Never Used  Substance and Sexual Activity  . Alcohol use: No  . Drug use: No  . Sexual activity: Not Currently  Lifestyle  . Physical activity:    Days per week: Not on file    Minutes per session: Not on file  . Stress: Not on file  Relationships  . Social connections:    Talks on phone: Not on file    Gets together: Not on file    Attends religious service: Not on file    Active member of club or organization: Not on file    Attends meetings of clubs or organizations: Not on file    Relationship status: Not on file  . Intimate partner violence:    Fear of current or ex partner: Not on file    Emotionally abused: Not on file    Physically abused: Not on file    Forced sexual activity: Not on file  Other Topics Concern  . Not on file  Social History Narrative   Exercise-- not regular    Health Maintenance  Topic Date Due  . Samul Dada  02/06/2018  . MAMMOGRAM  05/18/2018  . INFLUENZA VACCINE  05/18/2018  . COLONOSCOPY  06/24/2020  . DEXA SCAN  Completed  . Hepatitis C Screening  Completed  . 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 She does not have any pertinent problems on file. 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 Her family history includes Alzheimer's disease in her cousin; Breast cancer in her unknown relative; Cancer in her cousin and maternal aunt; Diabetes in her mother; Heart disease in her cousin and father; Hypertension in her mother; Osteoporosis  in her unknown relative; Stroke in her mother. She  reports that she has never smoked. She has never used smokeless tobacco. She reports that she does not drink alcohol or use drugs. She has a current medication list which includes the following prescription(s): acetaminophen, aspirin, calcium-magnesium-vitamin d, ferrous sulfate, flax seed oil, fluticasone, hypromellose, levothyroxine, metoprolol tartrate, multivitamin, neomycin-bacitracin-polymyxin, fish oil, polyethylene glycol 3350, and  pravastatin. Current Outpatient Medications on File Prior to Visit  Medication Sig Dispense Refill  . acetaminophen (TYLENOL) 500 MG tablet Take 500 mg by mouth daily as needed for moderate pain or headache.    Marland Kitchen aspirin 81 MG tablet Take 81 mg by mouth every evening.      Marland Kitchen CALCIUM-MAGNESIUM-VITAMIN D PO Take 1 tablet by mouth daily.    . ferrous sulfate 325 (65 FE) MG tablet Take 325 mg by mouth daily.    . Flaxseed, Linseed, (FLAX SEED OIL) 1000 MG CAPS Take 1,000 mg by mouth daily.     . fluticasone (FLONASE) 50 MCG/ACT nasal spray Place 2 sprays into both nostrils daily. (Patient taking differently: Place 1 spray into both nostrils daily as needed for allergies. ) 16 g 1  . Hypromellose (ARTIFICIAL TEARS OP) Apply 1 drop to eye daily as needed (dry eyes).    Marland Kitchen levothyroxine (SYNTHROID, LEVOTHROID) 50 MCG tablet TAKE 1 TABLET BY MOUTH ONCE DAILY 90 tablet 0  . metoprolol tartrate (LOPRESSOR) 25 MG tablet take 1/2 tablet by mouth twice a day MAY TAKE AN EXTRA 1/2 TABLET FOR PALPITATIONS IF NEEDED (Patient taking differently: take 1/2 tablet by mouth twice a day) 135 tablet 3  . Multiple Vitamin (MULTIVITAMIN) tablet Take 1 tablet by mouth daily.      Marland Kitchen neomycin-bacitracin-polymyxin (NEOSPORIN) ointment Apply 1 application topically daily as needed for wound care.    . Omega-3 Fatty Acids (FISH OIL) 1000 MG CAPS Take 1 capsule by mouth 2 (two) times daily.     . Polyethylene Glycol 3350 (MIRALAX PO) Take 17 g by mouth daily as needed (constipation).     . pravastatin (PRAVACHOL) 40 MG tablet TAKE 1 TABLET BY MOUTH EVERY EVENING 90 tablet 0   No current facility-administered medications on file prior to visit.    She is allergic to sulfonamide derivatives..  Review of Systems Review of Systems  Constitutional: Negative for activity change, appetite change and fatigue.  HENT: Negative for hearing loss, congestion, tinnitus and ear discharge.  dentist q75m Eyes: Negative for visual  disturbance (see optho q1y -- vision corrected to 20/20 with glasses).  Respiratory: Negative for cough, chest tightness and shortness of breath.   Cardiovascular: Negative for chest pain, palpitations and leg swelling.  Gastrointestinal: Negative for abdominal pain, diarrhea, constipation and abdominal distention.  Genitourinary: Negative for urgency, frequency, decreased urine volume and difficulty urinating.  Musculoskeletal: Negative for back pain, arthralgias and gait problem.  Skin: Negative for color change, pallor and rash.  Neurological: Negative for dizziness, light-headedness, numbness and headaches.  Hematological: Negative for adenopathy. Does not bruise/bleed easily.  Psychiatric/Behavioral: Negative for suicidal ideas, confusion, sleep disturbance, self-injury, dysphoric mood, decreased concentration and agitation.      Objective:    BP (!) 130/58 (BP Location: Right Arm, Cuff Size: Normal)   Pulse (!) 58   Temp 98.5 F (36.9 C) (Oral)   Resp 16   Ht 5\' 4"  (1.626 m)   Wt 141 lb 12.8 oz (64.3 kg)   SpO2 99%   BMI 24.34 kg/m  General appearance: alert, cooperative,  appears stated age and no distress Head: Normocephalic, without obvious abnormality, atraumatic Eyes: negative findings: lids and lashes normal and pupils equal, round, reactive to light and accomodation Ears: normal TM's and external ear canals both ears Nose: Nares normal. Septum midline. Mucosa normal. No drainage or sinus tenderness. Throat: lips, mucosa, and tongue normal; teeth and gums normal Neck: no adenopathy, no carotid bruit, no JVD, supple, symmetrical, trachea midline and thyroid not enlarged, symmetric, no tenderness/mass/nodules Back: symmetric, no curvature. ROM normal. No CVA tenderness. Lungs: clear to auscultation bilaterally Breasts: normal appearance, no masses or tenderness Heart: regular rate and rhythm, S1, S2 normal, no murmur, click, rub or gallop Abdomen: soft, non-tender; bowel  sounds normal; no masses,  no organomegaly Pelvic: not indicated; status post hysterectomy, negative ROS Extremities: extremities normal, atraumatic, no cyanosis or edema Pulses: 2+ and symmetric Skin: Skin color, texture, turgor normal. No rashes or lesions Lymph nodes: Cervical, supraclavicular, and axillary nodes normal. Neurologic: Alert and oriented X 3, normal strength and tone. Normal symmetric reflexes. Normal coordination and gait    Assessment:    Healthy female exam.      Plan:    ghm utd Check labs  See After Visit Summary for Counseling Recommendations    1. Hypothyroidism, unspecified type con't meds - TSH  2. Hyperlipidemia LDL goal <100 Encouraged heart healthy diet, increase exercise, avoid trans fats, consider a krill oil cap daily - Comprehensive metabolic panel - CBC with Differential/Platelet - Lipid panel  3. Preventative health care See above See AVS  4. Dysphagia, unspecified type   - omeprazole (PRILOSEC) 20 MG capsule; Take 1 capsule (20 mg total) by mouth daily.  Dispense: 30 capsule; Refill: 3 - Ambulatory referral to Gastroenterology

## 2018-06-05 NOTE — Patient Instructions (Signed)
Preventive Care 74 Years and Older, Female Preventive care refers to lifestyle choices and visits with your health care provider that can promote health and wellness. What does preventive care include?  A yearly physical exam. This is also called an annual well check.  Dental exams once or twice a year.  Routine eye exams. Ask your health care provider how often you should have your eyes checked.  Personal lifestyle choices, including: ? Daily care of your teeth and gums. ? Regular physical activity. ? Eating a healthy diet. ? Avoiding tobacco and drug use. ? Limiting alcohol use. ? Practicing safe sex. ? Taking low-dose aspirin every day. ? Taking vitamin and mineral supplements as recommended by your health care provider. What happens during an annual well check? The services and screenings done by your health care provider during your annual well check will depend on your age, overall health, lifestyle risk factors, and family history of disease. Counseling Your health care provider may ask you questions about your:  Alcohol use.  Tobacco use.  Drug use.  Emotional well-being.  Home and relationship well-being.  Sexual activity.  Eating habits.  History of falls.  Memory and ability to understand (cognition).  Work and work environment.  Reproductive health.  Screening You may have the following tests or measurements:  Height, weight, and BMI.  Blood pressure.  Lipid and cholesterol levels. These may be checked every 5 years, or more frequently if you are over 50 years old.  Skin check.  Lung cancer screening. You may have this screening every year starting at age 55 if you have a 30-pack-year history of smoking and currently smoke or have quit within the past 15 years.  Fecal occult blood test (FOBT) of the stool. You may have this test every year starting at age 50.  Flexible sigmoidoscopy or colonoscopy. You may have a sigmoidoscopy every 5 years or  a colonoscopy every 10 years starting at age 50.  Hepatitis C blood test.  Hepatitis B blood test.  Sexually transmitted disease (STD) testing.  Diabetes screening. This is done by checking your blood sugar (glucose) after you have not eaten for a while (fasting). You may have this done every 1-3 years.  Bone density scan. This is done to screen for osteoporosis. You may have this done starting at age 74.  Mammogram. This may be done every 1-2 years. Talk to your health care provider about how often you should have regular mammograms.  Talk with your health care provider about your test results, treatment options, and if necessary, the need for more tests. Vaccines Your health care provider may recommend certain vaccines, such as:  Influenza vaccine. This is recommended every year.  Tetanus, diphtheria, and acellular pertussis (Tdap, Td) vaccine. You may need a Td booster every 10 years.  Varicella vaccine. You may need this if you have not been vaccinated.  Zoster vaccine. You may need this after age 60.  Measles, mumps, and rubella (MMR) vaccine. You may need at least one dose of MMR if you were born in 1957 or later. You may also need a second dose.  Pneumococcal 13-valent conjugate (PCV13) vaccine. One dose is recommended after age 74.  Pneumococcal polysaccharide (PPSV23) vaccine. One dose is recommended after age 74.  Meningococcal vaccine. You may need this if you have certain conditions.  Hepatitis A vaccine. You may need this if you have certain conditions or if you travel or work in places where you may be exposed to hepatitis   A.  Hepatitis B vaccine. You may need this if you have certain conditions or if you travel or work in places where you may be exposed to hepatitis B.  Haemophilus influenzae type b (Hib) vaccine. You may need this if you have certain conditions.  Talk to your health care provider about which screenings and vaccines you need and how often you  need them. This information is not intended to replace advice given to you by your health care provider. Make sure you discuss any questions you have with your health care provider. Document Released: 10/31/2015 Document Revised: 06/23/2016 Document Reviewed: 08/05/2015 Elsevier Interactive Patient Education  2018 Elsevier Inc[employer]  

## 2018-06-06 LAB — CBC WITH DIFFERENTIAL/PLATELET
Basophils Absolute: 0.1 10*3/uL (ref 0.0–0.1)
Basophils Relative: 1.2 % (ref 0.0–3.0)
Eosinophils Absolute: 0.2 10*3/uL (ref 0.0–0.7)
Eosinophils Relative: 3.1 % (ref 0.0–5.0)
HEMATOCRIT: 38.5 % (ref 36.0–46.0)
HEMOGLOBIN: 12.7 g/dL (ref 12.0–15.0)
LYMPHS PCT: 32.4 % (ref 12.0–46.0)
Lymphs Abs: 2.1 10*3/uL (ref 0.7–4.0)
MCHC: 33 g/dL (ref 30.0–36.0)
MCV: 96 fl (ref 78.0–100.0)
MONO ABS: 0.7 10*3/uL (ref 0.1–1.0)
Monocytes Relative: 10.9 % (ref 3.0–12.0)
Neutro Abs: 3.4 10*3/uL (ref 1.4–7.7)
Neutrophils Relative %: 52.4 % (ref 43.0–77.0)
Platelets: 263 10*3/uL (ref 150.0–400.0)
RBC: 4.01 Mil/uL (ref 3.87–5.11)
RDW: 13.2 % (ref 11.5–15.5)
WBC: 6.4 10*3/uL (ref 4.0–10.5)

## 2018-06-06 LAB — COMPREHENSIVE METABOLIC PANEL
ALBUMIN: 4.7 g/dL (ref 3.5–5.2)
ALT: 19 U/L (ref 0–35)
AST: 24 U/L (ref 0–37)
Alkaline Phosphatase: 76 U/L (ref 39–117)
BUN: 22 mg/dL (ref 6–23)
CO2: 30 mEq/L (ref 19–32)
Calcium: 10.5 mg/dL (ref 8.4–10.5)
Chloride: 104 mEq/L (ref 96–112)
Creatinine, Ser: 0.86 mg/dL (ref 0.40–1.20)
GFR: 68.56 mL/min (ref >60.00)
Glucose, Bld: 107 mg/dL — ABNORMAL HIGH (ref 70–99)
Potassium: 4.7 mEq/L (ref 3.5–5.1)
SODIUM: 144 meq/L (ref 135–145)
Total Bilirubin: 0.5 mg/dL (ref 0.2–1.2)
Total Protein: 7.5 g/dL (ref 6.0–8.3)

## 2018-06-06 LAB — LIPID PANEL
CHOL/HDL RATIO: 3
Cholesterol: 213 mg/dL — ABNORMAL HIGH (ref 0–200)
HDL: 81.3 mg/dL (ref >39.00)
LDL CALC: 113 mg/dL — AB (ref 0–99)
NONHDL: 131.69
Triglycerides: 95 mg/dL (ref 0.0–149.0)
VLDL: 19 mg/dL (ref 0.0–40.0)

## 2018-06-06 LAB — TSH: TSH: 2.28 u[IU]/mL (ref 0.35–4.50)

## 2018-06-06 NOTE — Progress Notes (Signed)
Reviewed  Yvonne R Lowne Chase, DO  

## 2018-06-07 ENCOUNTER — Encounter: Payer: Self-pay | Admitting: *Deleted

## 2018-06-07 ENCOUNTER — Other Ambulatory Visit: Payer: Self-pay | Admitting: *Deleted

## 2018-06-07 DIAGNOSIS — I10 Essential (primary) hypertension: Secondary | ICD-10-CM

## 2018-06-07 DIAGNOSIS — E039 Hypothyroidism, unspecified: Secondary | ICD-10-CM

## 2018-06-07 DIAGNOSIS — E785 Hyperlipidemia, unspecified: Secondary | ICD-10-CM

## 2018-06-09 ENCOUNTER — Encounter: Payer: Self-pay | Admitting: Family Medicine

## 2018-06-12 ENCOUNTER — Ambulatory Visit
Admission: RE | Admit: 2018-06-12 | Discharge: 2018-06-12 | Disposition: A | Discharge status: Home / Self Care | Payer: Medicare Other | Source: Ambulatory Visit | Attending: Family Medicine | Admitting: Family Medicine

## 2018-06-12 DIAGNOSIS — Z1231 Encounter for screening mammogram for malignant neoplasm of breast: Secondary | ICD-10-CM

## 2018-06-13 ENCOUNTER — Other Ambulatory Visit: Payer: Self-pay | Admitting: Family Medicine

## 2018-06-13 DIAGNOSIS — I10 Essential (primary) hypertension: Secondary | ICD-10-CM

## 2018-07-17 ENCOUNTER — Other Ambulatory Visit: Payer: Self-pay | Admitting: Family Medicine

## 2018-07-17 DIAGNOSIS — E039 Hypothyroidism, unspecified: Secondary | ICD-10-CM

## 2018-07-17 DIAGNOSIS — E785 Hyperlipidemia, unspecified: Secondary | ICD-10-CM

## 2018-07-26 ENCOUNTER — Ambulatory Visit: Payer: Medicare Other | Admitting: Internal Medicine

## 2018-08-31 ENCOUNTER — Ambulatory Visit: Payer: Medicare Other | Admitting: Family Medicine

## 2018-08-31 ENCOUNTER — Encounter: Payer: Self-pay | Admitting: Family Medicine

## 2018-08-31 VITALS — BP 128/68 | HR 55 | Temp 98.6°F | Ht 64.0 in | Wt 143.0 lb

## 2018-08-31 DIAGNOSIS — E039 Hypothyroidism, unspecified: Secondary | ICD-10-CM | POA: Diagnosis not present

## 2018-08-31 DIAGNOSIS — E785 Hyperlipidemia, unspecified: Secondary | ICD-10-CM | POA: Diagnosis not present

## 2018-08-31 DIAGNOSIS — R1310 Dysphagia, unspecified: Secondary | ICD-10-CM | POA: Diagnosis not present

## 2018-08-31 DIAGNOSIS — R002 Palpitations: Secondary | ICD-10-CM

## 2018-08-31 NOTE — Progress Notes (Signed)
Subjective:   Patient ID: Nancy Cuevas, female    DOB: 1946-05-02, 72 y.o.   MRN: [id number]  Nancy Cuevas is a pleasant 72 y.o. year old female who presents to clinic today with [place] (TOC-She is scheduled for an Upper GI next week-due to dysphagia.)  on 08/31/2018  HPI:  Establishing care from Dr. Etter Sjogren. Chart reviewed. Saw previous PCP on 06/05/18.  Hypothyroidism- has been clinically euthyroid on synthroid 50 mcg daily.  Has been on this dose for over 10 years. Denies any symptoms of  Hypo or hyperthyroidism. Lab Results  Component Value Date   TSH 2.28 06/05/2018   HLD- compliant with pravachol 40 mg daily. Lab Results  Component Value Date   CHOL 213 (H) 06/05/2018   HDL 81.30 06/05/2018   LDLCALC 113 (H) 06/05/2018   LDLDIRECT 112.9 03/23/2012   TRIG 95.0 06/05/2018   CHOLHDL 3 06/05/2018   Lab Results  Component Value Date   ALT 19 06/05/2018   AST 24 06/05/2018   ALKPHOS 76 06/05/2018   BILITOT 0.5 06/05/2018   Dysphagia-  Food getting stuck, it is becoming more frequent in occurance- previous PCP started her on prilosec 20 mg daily and referred her to GI when she last saw her.  She has appointment scheduled with GI next week for endoscopy.   Palpitations- on metoprolol 1/2 tab twice daily.  Has been taking this since 08/07/2013 - started by Dr. Aundra Dubin.  Note reviewed. She has been taking it twice daily scheduled, not as needed as it is written.   Current Outpatient Medications on File Prior to Visit  Medication Sig Dispense Refill  . acetaminophen (TYLENOL) 500 MG tablet Take 500 mg by mouth daily as needed for moderate pain or headache.    Marland Kitchen aspirin 81 MG tablet Take 81 mg by mouth every evening.      Marland Kitchen CALCIUM-MAGNESIUM-VITAMIN D PO Take 1 tablet by mouth daily.    . ferrous sulfate 325 (65 FE) MG tablet Take 325 mg by mouth daily.    . Flaxseed, Linseed, (FLAX SEED OIL) 1000 MG CAPS Take 1,000 mg by mouth daily.     . fluticasone (FLONASE) 50  MCG/ACT nasal spray Place 2 sprays into both nostrils daily. (Patient taking differently: Place 1 spray into both nostrils daily as needed for allergies. ) 16 g 1  . Hypromellose (ARTIFICIAL TEARS OP) Apply 1 drop to eye daily as needed (dry eyes).    Marland Kitchen levothyroxine (SYNTHROID, LEVOTHROID) 50 MCG tablet TAKE 1 TABLET BY MOUTH EVERY DAY 90 tablet 0  . metoprolol tartrate (LOPRESSOR) 25 MG tablet TAKE 1/2 TABLET BY MOUTH TWICE A DAY MAY TAKE AN EXTRA 1/2 TABLET BY MOUTH FOR PALPITATIONS IF NEEDED 135 tablet 1  . Multiple Vitamin (MULTIVITAMIN) tablet Take 1 tablet by mouth daily.      Marland Kitchen neomycin-bacitracin-polymyxin (NEOSPORIN) ointment Apply 1 application topically daily as needed for wound care.    . Omega-3 Fatty Acids (FISH OIL) 1000 MG CAPS Take 1 capsule by mouth 2 (two) times daily.     . Polyethylene Glycol 3350 (MIRALAX PO) Take 17 g by mouth daily as needed (constipation).     . pravastatin (PRAVACHOL) 40 MG tablet TAKE 1 TABLET BY MOUTH EVERY EVENING 90 tablet 0  . FLUAD 0.5 ML SUSY ADM 0.5ML IM UTD  0  . omeprazole (PRILOSEC) 20 MG capsule Take 1 capsule (20 mg total) by mouth daily. (Patient not taking: Reported on 08/31/2018) 30 capsule 3  No current facility-administered medications on file prior to visit.     Allergies  Allergen Reactions  . Sulfonamide Derivatives Rash    Past Medical History:  Diagnosis Date  . 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 Bunion   . Contact dermatitis and other eczema, due to unspecified cause   . Contact dermatitis and other eczema, due to unspecified cause   . Disorder of bone and cartilage, unspecified   . Female stress incontinence   . HYPERLIPIDEMIA 10/27/2010  . Hyperpotassemia   . HYPOTHYROIDISM 07/18/2007  . Nonspecific (abnormal) findings on radiological and other examination of other intrathoracic organs   . OSTEOPENIA 03/24/2008  . Palpitations    Echo (1/12) EF 50-53%, mild diastolic dysfunction, atrial septal  aneurysm. Holter (1/12): short runs of atrial tachycardia versus MAT. ;  Echo (14) EF 55-60%, normal wall motion, mild MR, mild LAE, atrial septal aneurysm  . Personal history of colonic and rectal adenomas 03/22/2013  . POSTMENOPAUSAL STATUS 02/07/2008  . Symptomatic menopausal or female climacteric states     Past Surgical History:  Procedure Laterality Date  . ABDOMINAL HYSTERECTOMY  1988   prolapse uterus  . CARPAL TUNNEL RELEASE  11/15/2011   Procedure: CARPAL TUNNEL RELEASE;  Surgeon: Tennis Must, MD;  Location: [place];  Service: Orthopedics;  Laterality: Left;  . CATARACT EXTRACTION  2011  . COLONOSCOPY  [id number]   normal,  . COLONOSCOPY WITH PROPOFOL N/A 06/24/2017   Procedure: COLONOSCOPY WITH PROPOFOL;  Surgeon: Gatha Mayer, MD;  Location: [facility];  Service: Endoscopy;  Laterality: N/A;  . FOOT SURGERY Right 10/2014  . FRACTURE SURGERY  june 2006   left wrist  . HAND SURGERY  oct 2012 and jan 2013   Dr Dean--[facility]  . HOT HEMOSTASIS N/A 06/24/2017   Procedure: HOT HEMOSTASIS (ARGON PLASMA COAGULATION/BICAP);  Surgeon: Gatha Mayer, MD;  Location: [facility];  Service: Endoscopy;  Laterality: N/A;  . left wrist hardware removal 10/12  10/12  . natural birth  1962, 1972  . PARTIAL PROCTECTOMY BY TEM  06/22/2013   Procedure: PARTIAL PROCTECTOMY BY TEM;  Surgeon: Leighton Ruff, MD;  Location: [facility];  Service: General;;  . RETINAL DETACHMENT SURGERY  2009   left    Family History  Problem Relation Age of Onset  . Diabetes Mother        1st degree relative  . Hypertension Mother   . Stroke Mother   . Heart disease Father   . Breast cancer Unknown        1st degree relative <50  . Osteoporosis Unknown        1st degree relative  . Cancer Maternal Aunt   . Cancer Cousin   . Heart disease Cousin   . Alzheimer's disease Cousin   . Colon cancer Neg Hx     Social History   Socioeconomic History  . Marital status: Married    Spouse  name: Not on file  . Number of children: Not on file  . Years of education: Not on file  . Highest education level: Not on file  Occupational History  . Not on file  Social Needs  . Financial resource strain: Not on file  . Food insecurity:    Worry: Not on file    Inability: Not on file  . Transportation needs:    Medical: Not on file    Non-medical: Not on file  Tobacco Use  . Smoking status:  Never Smoker  . Smokeless tobacco: Never Used  Substance and Sexual Activity  . Alcohol use: No  . Drug use: No  . Sexual activity: Not Currently  Lifestyle  . Physical activity:    Days per week: Not on file    Minutes per session: Not on file  . Stress: Not on file  Relationships  . Social connections:    Talks on phone: Not on file    Gets together: Not on file    Attends religious service: Not on file    Active member of club or organization: Not on file    Attends meetings of clubs or organizations: Not on file    Relationship status: Not on file  . Intimate partner violence:    Fear of current or ex partner: Not on file    Emotionally abused: Not on file    Physically abused: Not on file    Forced sexual activity: Not on file  Other Topics Concern  . Not on file  Social History Narrative   Exercise-- not regular    The PMH, PSH, Social History, Family History, Medications, and allergies have been reviewed in [facility], and have been updated if relevant.   Review of Systems  Constitutional: Negative.   HENT: Negative.   Eyes: Negative.   Respiratory: Negative.   Cardiovascular: Negative.   Gastrointestinal: Negative.   Endocrine: Negative.   Genitourinary: Negative.   Musculoskeletal: Negative.   Skin: Negative.   Allergic/Immunologic: Negative.   Neurological: Negative.   Hematological: Negative.   Psychiatric/Behavioral: Negative.   All other systems reviewed and are negative.      Objective:    BP 128/68   Pulse (!) 55   Temp 98.6 F (37 C) (Oral)   Ht  5\' 4"  (1.626 m)   Wt 143 lb (64.9 kg)   SpO2 98%   BMI 24.55 kg/m    Physical Exam  Constitutional: She is oriented to person, place, and time. She appears well-developed and well-nourished. No distress.  HENT:  Head: Normocephalic and atraumatic.  Eyes: EOM are normal.  Neck: Normal range of motion.  Cardiovascular: Regular rhythm. Bradycardia present.  Pulmonary/Chest: Effort normal and breath sounds normal.  Abdominal: Soft. Bowel sounds are normal.  Musculoskeletal: Normal range of motion. She exhibits no edema.  Neurological: She is alert and oriented to person, place, and time. No cranial nerve deficit.  Skin: Skin is warm and dry. She is not diaphoretic.  Psychiatric: She has a normal mood and affect. Her behavior is normal. Judgment and thought content normal.  Nursing note and vitals reviewed.         Assessment & Plan:   Hypothyroidism, unspecified type  Dysphagia, unspecified type  PALPITATIONS, OCCASIONAL  Hyperlipidemia, unspecified hyperlipidemia type No follow-ups on file.

## 2018-08-31 NOTE — Patient Instructions (Signed)
Great to meet you. Please decrease your metoprolol to 12.5 mg once daily.  Please keep a check on your blood pressure and pulse and keep me updated.

## 2018-08-31 NOTE — Assessment & Plan Note (Signed)
>  25 minutes spent in face to face time with patient, >50% spent in counselling or coordination of care  Advised a trial of decreasing metoprolol to 12.5 mg daily and if she tolerates that, to try taking it only as needed since she is bradycardic today.  She also has not tried stopping it to see if she still does have intermittent palpitations.  She has BP cuff at home and will check her BP and pulse for me and keep me updated.  She will also alert me if she does develop palpitations with the change in dosage.  The patient indicates understanding of these issues and agrees with the plan.

## 2018-08-31 NOTE — Assessment & Plan Note (Signed)
Well controlled on current dose of Pravachol. No changes made to rxs. Labs are up to date.

## 2018-08-31 NOTE — Assessment & Plan Note (Signed)
Euthyroid on current dose of synthroid. No changes made today, labs up to date.

## 2018-08-31 NOTE — Assessment & Plan Note (Signed)
?   Esophogeal stricture.  She has appointment with Dr. Carlean Purl next week and will likely undergo endoscopy.

## 2018-09-05 ENCOUNTER — Ambulatory Visit: Payer: Medicare Other | Admitting: Internal Medicine

## 2018-09-05 ENCOUNTER — Encounter: Payer: Self-pay | Admitting: Internal Medicine

## 2018-09-05 VITALS — BP 130/62 | HR 64 | Ht 62.75 in | Wt 145.1 lb

## 2018-09-05 DIAGNOSIS — R1310 Dysphagia, unspecified: Secondary | ICD-10-CM

## 2018-09-05 DIAGNOSIS — R1319 Other dysphagia: Secondary | ICD-10-CM

## 2018-09-05 NOTE — Progress Notes (Signed)
Nancy Cuevas 72 y.o. 08/24/1946 [id number]  Assessment & Plan:   Encounter Diagnosis  Name Primary?  . Esophageal dysphagia Yes   Evaluate and treat with upper endoscopy and esophageal dilation. The risks and benefits as well as alternatives of endoscopic procedure(s) have been discussed and reviewed. All questions answered. The patient agrees to proceed.    Subjective:   Chief Complaint: Dysphagia  HPI Nancy Cuevas is here with complaints of solid food dysphagia.  The first instance was at 2 years ago with a [employer] hamburger.  She went a long time without problems but over the last several months or more there is been intermittent solid food dysphagia with a midsternal sticking point to bread.  Other solid foods as well.  She saw Dr. Cheri Rous in the summer and was started on Prilosec because of this.  Symptoms have persisted and perhaps more frequent.  She does not recall having much heartburn difficulty.  No unintentional weight loss.  More recently at a church cookout she had fried apple pie and had a terrible impaction and thought she might need to regurgitate but waited it out and did not.  She has been very careful about chewing and eating slowly.  She has never had an upper endoscopy or dilation.  No barium studies.  He was last seen by me for a colonoscopy in September 2018 for follow-up of polyps that was negative after piecemeal polypectomy earlier in 2018.  She told me about a river boat cruise to [place] that she took with her husband this summer and very much enjoyed it and how she would like to go to [place] Allergies  Allergen Reactions  . Sulfonamide Derivatives Rash   Current Meds  Medication Sig  . acetaminophen (TYLENOL) 500 MG tablet Take 500 mg by mouth daily as needed for moderate pain or headache.  Marland Kitchen aspirin 81 MG tablet Take 81 mg by mouth every evening.    Marland Kitchen CALCIUM-MAGNESIUM-VITAMIN D PO Take 1 tablet by mouth daily.  . ferrous sulfate 325 (65 FE) MG  tablet Take 325 mg by mouth daily.  . Flaxseed, Linseed, (FLAX SEED OIL) 1000 MG CAPS Take 1,000 mg by mouth daily.   Marland Kitchen FLUAD 0.5 ML SUSY ADM 0.5ML IM UTD  . fluticasone (FLONASE) 50 MCG/ACT nasal spray Place 2 sprays into both nostrils daily. (Patient taking differently: Place 1 spray into both nostrils daily as needed for allergies. )  . Hypromellose (ARTIFICIAL TEARS OP) Apply 1 drop to eye daily as needed (dry eyes).  Marland Kitchen levothyroxine (SYNTHROID, LEVOTHROID) 50 MCG tablet TAKE 1 TABLET BY MOUTH EVERY DAY  . metoprolol tartrate (LOPRESSOR) 25 MG tablet TAKE 1/2 TABLET BY MOUTH TWICE A DAY MAY TAKE AN EXTRA 1/2 TABLET BY MOUTH FOR PALPITATIONS IF NEEDED  . Multiple Vitamin (MULTIVITAMIN) tablet Take 1 tablet by mouth daily.    Marland Kitchen neomycin-bacitracin-polymyxin (NEOSPORIN) ointment Apply 1 application topically daily as needed for wound care.  . Omega-3 Fatty Acids (FISH OIL) 1000 MG CAPS Take 1 capsule by mouth 2 (two) times daily.   . Polyethylene Glycol 3350 (MIRALAX PO) Take 17 g by mouth daily as needed (constipation).   . pravastatin (PRAVACHOL) 40 MG tablet TAKE 1 TABLET BY MOUTH EVERY EVENING   Past Medical History:  Diagnosis Date  . 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 Bunion   . Contact dermatitis and other eczema, due to unspecified cause   . Contact dermatitis and other eczema, due to unspecified  cause   . Disorder of bone and cartilage, unspecified   . Female stress incontinence   . HYPERLIPIDEMIA 10/27/2010  . Hyperpotassemia   . HYPOTHYROIDISM 07/18/2007  . Nonspecific (abnormal) findings on radiological and other examination of other intrathoracic organs   . OSTEOPENIA 03/24/2008  . Palpitations    Echo (1/12) EF 29-51%, mild diastolic dysfunction, atrial septal aneurysm. Holter (1/12): short runs of atrial tachycardia versus MAT. ;  Echo (14) EF 55-60%, normal wall motion, mild MR, mild LAE, atrial septal aneurysm  . Personal history of colonic and rectal  adenomas 03/22/2013  . POSTMENOPAUSAL STATUS 02/07/2008  . Symptomatic menopausal or female climacteric states    Past Surgical History:  Procedure Laterality Date  . ABDOMINAL HYSTERECTOMY  1988   prolapse uterus  . CARPAL TUNNEL RELEASE  11/15/2011   Procedure: CARPAL TUNNEL RELEASE;  Surgeon: Tennis Must, MD;  Location: [place];  Service: Orthopedics;  Laterality: Left;  . CATARACT EXTRACTION  2011  . COLONOSCOPY  [id number]   normal,  . COLONOSCOPY WITH PROPOFOL N/A 06/24/2017   Procedure: COLONOSCOPY WITH PROPOFOL;  Surgeon: Gatha Mayer, MD;  Location: [facility];  Service: Endoscopy;  Laterality: N/A;  . FOOT SURGERY Right 10/2014  . FRACTURE SURGERY  june 2006   left wrist  . HAND SURGERY  oct 2012 and jan 2013   Dr Dean--[facility]  . HOT HEMOSTASIS N/A 06/24/2017   Procedure: HOT HEMOSTASIS (ARGON PLASMA COAGULATION/BICAP);  Surgeon: Gatha Mayer, MD;  Location: [facility];  Service: Endoscopy;  Laterality: N/A;  . left wrist hardware removal 10/12  10/12  . natural birth  1962, 1974  . PARTIAL PROCTECTOMY BY TEM  06/22/2013   Procedure: PARTIAL PROCTECTOMY BY TEM;  Surgeon: Leighton Ruff, MD;  Location: [facility];  Service: General;;  . RETINAL DETACHMENT SURGERY  2009   left   Social History   Social History Narrative   Exercise-- not regular    family history includes Alzheimer's disease in her cousin; Breast cancer in her unknown relative; Cancer in her cousin and maternal aunt; Diabetes in her mother; Heart disease in her cousin and father; Hypertension in her mother; Osteoporosis in her unknown relative; Stroke in her mother.   Review of Systems  See HPI Objective:   Physical Exam BP 130/62 (BP Location: Left Arm, Patient Position: Sitting, Cuff Size: Normal)   Pulse 64   Ht 5' 2.75" (1.594 m) Comment: height measured without shoes  Wt 145 lb 2 oz (65.8 kg)   BMI 25.91 kg/m  No acute distress Eyes are anicteric Lungs are  clear Heart sounds are normal  Mood and affect are appropriate  Data reviewed, primary care notes prior GI endoscopy reports as per HPI

## 2018-09-05 NOTE — Patient Instructions (Signed)

## 2018-09-18 ENCOUNTER — Ambulatory Visit (AMBULATORY_SURGERY_CENTER): Payer: Medicare Other | Admitting: Internal Medicine

## 2018-09-18 ENCOUNTER — Encounter: Payer: Self-pay | Admitting: Internal Medicine

## 2018-09-18 VITALS — BP 127/65 | HR 65 | Temp 97.3°F | Resp 11 | Ht 62.75 in | Wt 145.0 lb

## 2018-09-18 DIAGNOSIS — K219 Gastro-esophageal reflux disease without esophagitis: Secondary | ICD-10-CM

## 2018-09-18 DIAGNOSIS — R1310 Dysphagia, unspecified: Secondary | ICD-10-CM

## 2018-09-18 DIAGNOSIS — K449 Diaphragmatic hernia without obstruction or gangrene: Secondary | ICD-10-CM

## 2018-09-18 DIAGNOSIS — K222 Esophageal obstruction: Secondary | ICD-10-CM | POA: Diagnosis not present

## 2018-09-18 MED ORDER — SODIUM CHLORIDE 0.9 % IV SOLN: 500.0000 mL | Freq: Once | INTRAVENOUS | Status: DC

## 2018-09-18 NOTE — Op Note (Signed)
[facility] Patient Name: Nancy Cuevas Procedure Date: 09/18/2018 3:18 PM MRN: [id number] Endoscopist: Gatha Mayer , MD Age: 72 Referring MD:  Date of Birth: 1946/06/07 Gender: Female Account #: [id number] Procedure:                Upper GI endoscopy Indications:              Dysphagia Medicines:                Propofol per Anesthesia, Monitored Anesthesia Care Procedure:                Pre-Anesthesia Assessment:                           - Prior to the procedure, a History and Physical                            was performed, and patient medications and                            allergies were reviewed. The patient's tolerance of                            previous anesthesia was also reviewed. The risks                            and benefits of the procedure and the sedation                            options and risks were discussed with the patient.                            All questions were answered, and informed consent                            was obtained. Prior Anticoagulants: The patient has                            taken no previous anticoagulant or antiplatelet                            agents. ASA Grade Assessment: II - A patient with                            mild systemic disease. After reviewing the risks                            and benefits, the patient was deemed in                            satisfactory condition to undergo the procedure.                           After obtaining informed consent, the endoscope was  passed under direct vision. Throughout the                            procedure, the patient's blood pressure, pulse, and                            oxygen saturations were monitored continuously. The                            Endoscope was introduced through the mouth, and                            advanced to the second part of duodenum. The upper                            GI endoscopy was  accomplished without difficulty.                            The patient tolerated the procedure well. Scope In: Scope Out: Findings:                 One benign-appearing, intrinsic moderate stenosis                            was found at the gastroesophageal junction. This                            stenosis measured 1.6 cm (inner diameter). The                            stenosis was traversed. A TTS dilator was passed                            through the scope. Dilation with an 18-19-20 mm                            balloon dilator was performed to 20 mm. The                            dilation site was examined and showed mild mucosal                            disruption. Estimated blood loss was minimal.                           A 5 cm hiatal hernia was present.                           The exam was otherwise without abnormality.                           The cardia and gastric fundus were normal on  retroflexion. Complications:            No immediate complications. Estimated Blood Loss:     Estimated blood loss was minimal. Impression:               - Benign-appearing esophageal stenosis. Dilated.                           - 5 cm hiatal hernia.                           - The examination was otherwise normal.                           - No specimens collected. Recommendation:           - Patient has a contact number available for                            emergencies. The signs and symptoms of potential                            delayed complications were discussed with the                            patient. Return to normal activities tomorrow.                            Written discharge instructions were provided to the                            patient.                           - Resume previous diet.                           - Continue present medications.                           - Clear liquids x 1 hour then soft foods rest of                             day. Start prior diet tomorrow.                           - Continue present medications.                           - STAY ON OMEPRAZOLE TO REDUCE CHANCE OF RECURRENT                            STRICTURE AND SWALLOWING DIFFICULTY                           IF THIS DILATION DOES NOT RESOLVE PROBLEMS SHE IS  TO CALL BACK/SCHEDULE FOLLOW-UP Gatha Mayer, MD 09/18/2018 3:47:49 PM This report has been signed electronically.

## 2018-09-18 NOTE — Progress Notes (Signed)
Called to room to assist during endoscopic procedure.  Patient ID and intended procedure confirmed with present staff. Received instructions for my participation in the procedure from the performing physician.  

## 2018-09-18 NOTE — Progress Notes (Signed)
To recovery, report to RN

## 2018-09-18 NOTE — Patient Instructions (Addendum)
I found a stricture - scar tissue from acid reflux - and dilated it. You should be able to swallow better but research and experience tell us the best way to prevent problems again is to take acid blocking medication. You also have a hiatal hernia - part of stomach moves up into chest.  Please take the omeprazole (Prilosec) every day.  If this does not resolve your swallowing problems pleaase let me know. I appreciate the opportunity to care for you. Gatha Mayer, MD, FACG  YOU HAD AN ENDOSCOPIC PROCEDURE TODAY AT [facility]:   Refer to the procedure report that was given to you for any specific questions about what was found during the examination.  If the procedure report does not answer your questions, please call your gastroenterologist to clarify.  If you requested that your care partner not be given the details of your procedure findings, then the procedure report has been included in a sealed envelope for you to review at your convenience later.  YOU SHOULD EXPECT: Some feelings of bloating in the abdomen. Passage of more gas than usual.  Walking can help get rid of the air that was put into your GI tract during the procedure and reduce the bloating. If you had a lower endoscopy (such as a colonoscopy or flexible sigmoidoscopy) you may notice spotting of blood in your stool or on the toilet paper. If you underwent a bowel prep for your procedure, you may not have a normal bowel movement for a few days.  Please Note:  You might notice some irritation and congestion in your nose or some drainage.  This is from the oxygen used during your procedure.  There is no need for concern and it should clear up in a day or so.  SYMPTOMS TO REPORT IMMEDIATELY:   Following upper endoscopy (EGD)  Vomiting of blood or coffee ground material  New chest pain or pain under the shoulder blades  Painful or persistently difficult swallowing  New shortness of breath  Fever of  100F or higher  Black, tarry-looking stools  For urgent or emergent issues, a gastroenterologist can be reached at any hour by calling [phone].   DIET:  We do recommend a small meal at first, but then you may proceed to your regular diet.  Drink plenty of fluids but you should avoid alcoholic beverages for 24 hours.  ACTIVITY:  You should plan to take it easy for the rest of today and you should NOT DRIVE or use heavy machinery until tomorrow (because of the sedation medicines used during the test).    FOLLOW UP: Our staff will call the number listed on your records the next business day following your procedure to check on you and address any questions or concerns that you may have regarding the information given to you following your procedure. If we do not reach you, we will leave a message.  However, if you are feeling well and you are not experiencing any problems, there is no need to return our call.  We will assume that you have returned to your regular daily activities without incident.  If any biopsies were taken you will be contacted by phone or by letter within the next 1-3 weeks.  Please call us at [phone] if you have not heard about the biopsies in 3 weeks.    SIGNATURES/CONFIDENTIALITY: You and/or your care partner have signed paperwork which will be entered into your electronic medical record.  These signatures attest to the fact that that the information above on your After Visit Summary has been reviewed and is understood.  Full responsibility of the confidentiality of this discharge information lies with you and/or your care-partner.

## 2018-09-19 ENCOUNTER — Telehealth: Payer: Self-pay

## 2018-09-19 NOTE — Telephone Encounter (Signed)
  Follow up Call-  Call back number 09/18/2018 12/02/2016  Post procedure Call Back phone  # [phone]  Permission to leave phone message Yes Yes  Some recent data might be hidden     Patient questions:  Do you have a fever, pain , or abdominal swelling? No. Pain Score  0 *  Have you tolerated food without any problems? Yes.    Have you been able to return to your normal activities? Yes.    Do you have any questions about your discharge instructions: Diet   No. Medications  No. Follow up visit  No.  Do you have questions or concerns about your Care? No.  Actions: * If pain score is 4 or above: No action needed, pain <4.

## 2018-10-12 ENCOUNTER — Other Ambulatory Visit: Payer: Self-pay | Admitting: Family Medicine

## 2018-10-12 DIAGNOSIS — E039 Hypothyroidism, unspecified: Secondary | ICD-10-CM

## 2018-10-12 DIAGNOSIS — E785 Hyperlipidemia, unspecified: Secondary | ICD-10-CM

## 2018-12-28 ENCOUNTER — Encounter: Payer: Self-pay | Admitting: Family Medicine

## 2018-12-28 ENCOUNTER — Ambulatory Visit: Payer: Medicare Other | Admitting: Family Medicine

## 2018-12-28 ENCOUNTER — Other Ambulatory Visit: Payer: Self-pay

## 2018-12-28 VITALS — BP 130/70 | HR 53 | Temp 97.9°F | Ht 62.75 in | Wt 144.2 lb

## 2018-12-28 DIAGNOSIS — H608X3 Other otitis externa, bilateral: Secondary | ICD-10-CM | POA: Diagnosis not present

## 2018-12-28 DIAGNOSIS — H6121 Impacted cerumen, right ear: Secondary | ICD-10-CM

## 2018-12-28 MED ORDER — FLUOCINOLONE ACETONIDE 0.01 % OT OIL: 5.0000 [drp] | TOPICAL_OIL | Freq: Two times a day (BID) | OTIC | 0 refills | Status: AC

## 2018-12-28 NOTE — Progress Notes (Signed)
Nancy Cuevas is a 73 y.o. female  Chief Complaint  Patient presents with   Otalgia    both ears are scally and becomes painful on outside not sure if it goes down or not.    HPI: Nancy Cuevas is a 73 y.o. female patient of Dr. Deborra Medina who presents to our office today and complains of dry, scaly, "crusty" skin B/L ear canals x 4-6 months. Not painful. Occasionally pt states her ears itch. She has used vaseline and other "healing ointment" without resolution of symptoms.  No fever, chills. No drainage from ears. No changes in hearing.   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 Holter (1/12): short runs of atrial tachycardia versus MAT. ;  Echo (14) EF 55-60%, normal wall motion, mild MR, mild LAE, atrial septal aneurysm   Personal history of colonic and rectal adenomas 03/22/2013   POSTMENOPAUSAL STATUS 02/07/2008   Symptomatic menopausal or female climacteric states     Past Surgical History:  Procedure Laterality Date   ABDOMINAL HYSTERECTOMY  1988   prolapse uterus   CARPAL TUNNEL RELEASE  11/15/2011   Procedure: CARPAL TUNNEL RELEASE;  Surgeon: Tennis Must, MD;  Location: [facility];  Service: Orthopedics;  Laterality: Left;   CATARACT EXTRACTION  2011   COLONOSCOPY  2003,2014   normal,   COLONOSCOPY WITH PROPOFOL N/A 06/24/2017   Procedure: COLONOSCOPY WITH PROPOFOL;  Surgeon: Gatha Mayer, MD;  Location: [facility];  Service: Endoscopy;  Laterality: N/A;   FOOT SURGERY Right 10/2014   FRACTURE SURGERY  june 2006   left wrist   HAND SURGERY  oct 2012 and jan 2013   Dr Dean--[facility]   HOT HEMOSTASIS N/A 06/24/2017   Procedure: HOT HEMOSTASIS (ARGON PLASMA COAGULATION/BICAP);  Surgeon: Gatha Mayer, MD;  Location: [facility];  Service: Endoscopy;  Laterality: N/A;   left wrist hardware removal 10/12  10/12   natural birth  1962, 1972   PARTIAL PROCTECTOMY BY TEM  06/22/2013   Procedure: PARTIAL PROCTECTOMY BY TEM;  Surgeon: Leighton Ruff, MD;  Location: [facility];  Service: General;;   RETINAL DETACHMENT SURGERY  2009   left    Social History   Socioeconomic History   Marital status: Married    Spouse name: Not on file   Number of children: 2   Years of education: Not on file   Highest education level: Not on file  Occupational History   Not on file  Social Needs   Financial resource strain: Not on file   Food insecurity:    Worry: Not on file    Inability: Not on file   Transportation needs:    Medical: Not on file    Non-medical: Not on file  Tobacco Use   Smoking status: Never Smoker   Smokeless tobacco: Never Used  Substance and Sexual Activity   Alcohol use: No   Drug  use: No   Sexual activity: Not Currently  Lifestyle   Physical activity:    Days per week: Not on file    Minutes per session: Not on file   Stress: Not on file  Relationships   Social connections:    Talks on phone: Not on file    Gets together: Not on file    Attends religious service: Not on file    Active member of club or organization: Not on file    Attends meetings of clubs or organizations: Not on file    Relationship status: Not on file   Intimate partner violence:    Fear of current or ex partner: Not on file    Emotionally abused: Not on file    Physically abused: Not on file    Forced sexual activity: Not on file  Other Topics  Concern   Not on file  Social History Narrative   Married one son one daughter and 3 grandchildren    Likes to travel   No alcohol tobacco or drug use reported   exercise-- not regular     Family History  Problem Relation Age of Onset   Diabetes Mother        1st degree relative   Hypertension Mother    Stroke Mother    Heart disease Father    Breast cancer Unknown        1st degree relative <50   Osteoporosis Unknown        1st degree relative   Cancer Maternal Aunt    Cancer Cousin    Heart disease Cousin    Alzheimer's disease Cousin    Colon cancer Neg Hx    Esophageal cancer Neg Hx    Stomach cancer Neg Hx    Rectal cancer Neg Hx      Immunization History  Administered Date(s) Administered   Influenza Split 09/17/2011   Influenza Whole 08/30/2007, 07/31/2008, 09/02/2010, 09/01/2012   Influenza, High Dose Seasonal PF 07/11/2013, 07/31/2014, 07/22/2015, 07/19/2016, 07/09/2017   Pneumococcal Conjugate-13 07/31/2014   Pneumococcal Polysaccharide-23 09/21/2010, 07/19/2016   Td 02/07/2008   Zoster 02/07/2008    Outpatient Encounter Medications as of 12/28/2018  Medication Sig   acetaminophen (TYLENOL) 500 MG tablet Take 500 mg by mouth daily as needed for moderate pain or headache.   aspirin 81 MG tablet Take 81 mg by mouth every evening.     CALCIUM-MAGNESIUM-VITAMIN D PO Take 1 tablet by mouth daily.   ferrous sulfate 325 (65 FE) MG tablet Take 325 mg by mouth daily.   Flaxseed, Linseed, (FLAX SEED OIL) 1000 MG CAPS Take 1,000 mg by mouth daily.    FLUAD 0.5 ML SUSY ADM 0.5ML IM UTD   fluticasone (FLONASE) 50 MCG/ACT nasal spray Place 2 sprays into both nostrils daily. (Patient taking differently: Place 1 spray into both nostrils daily as needed for allergies. )   Hypromellose (ARTIFICIAL TEARS OP) Apply 1 drop to eye daily as needed (dry eyes).   levothyroxine (SYNTHROID, LEVOTHROID) 50 MCG tablet TAKE 1 TABLET BY MOUTH EVERY DAY    metoprolol tartrate (LOPRESSOR) 25 MG tablet TAKE 1/2 TABLET BY MOUTH TWICE A DAY MAY TAKE AN EXTRA 1/2 TABLET BY MOUTH FOR PALPITATIONS IF NEEDED   Multiple Vitamin (MULTIVITAMIN) tablet Take 1 tablet by mouth daily.     neomycin-bacitracin-polymyxin (NEOSPORIN) ointment Apply 1 application topically daily as needed for wound care.   Omega-3 Fatty Acids (FISH OIL) 1000 MG CAPS Take 1 capsule by mouth 2 (  two) times daily.    pravastatin (PRAVACHOL) 40 MG tablet TAKE 1 TABLET BY MOUTH EVERY EVENING   omeprazole (PRILOSEC) 20 MG capsule Take 1 capsule (20 mg total) by mouth daily. (Patient not taking: Reported on 09/18/2018)   No facility-administered encounter medications on file as of 12/28/2018.      ROS: Pertinent positives and negatives noted in HPI. Remainder of ROS non-contributory    Allergies  Allergen Reactions   Sulfonamide Derivatives Rash    BP 130/70    Pulse (!) 53    Temp 97.9 F (36.6 C) (Oral)    Ht 5' 2.75" (1.594 m)    Wt 144 lb 3.2 oz (65.4 kg)    SpO2 96%    BMI 25.75 kg/m   Physical Exam  Constitutional: She appears well-developed and well-nourished. No distress.  HENT:  Right Ear: Tympanic membrane and external ear normal.  Left Ear: Tympanic membrane and external ear normal.  Nose: Nose normal.  Mouth/Throat: Oropharynx is clear and moist.  Rt ear with cerumen impaction that was removed with irrigation and lighted curette  B/L proximal canal with dry, flaky skin; no erythema or swelling, no drainage  Eyes: Conjunctivae are normal.  Neck: Neck supple.  Lymphadenopathy:    She has no cervical adenopathy.     A/P:  1. Right ear impacted cerumen - Ear Lavage - cerumen removed and pt tolerated without issue  2. Chronic eczematous otitis externa of both ears Rx: - Fluocinolone Acetonide 0.01 % OIL; Place 5 drops in ear(s) 2 (two) times daily for 7 days.  Dispense: 20 mL; Refill: 0 - f/u PRN

## 2018-12-28 NOTE — Patient Instructions (Addendum)
Use hydrogen peroxide 2-3x/wk - soak a cotton ball with hydrogen peroxide and squeeze a few drop in ear. Let sit for 10-15 sec then tilt head to drain.   Use Rx drops as prescribed

## 2019-01-07 ENCOUNTER — Other Ambulatory Visit: Payer: Self-pay | Admitting: Family Medicine

## 2019-01-07 DIAGNOSIS — E039 Hypothyroidism, unspecified: Secondary | ICD-10-CM

## 2019-01-07 DIAGNOSIS — E785 Hyperlipidemia, unspecified: Secondary | ICD-10-CM

## 2019-01-07 DIAGNOSIS — R1310 Dysphagia, unspecified: Secondary | ICD-10-CM

## 2019-01-08 NOTE — Telephone Encounter (Signed)
Dr. Loletha Grayer please advise pt is requesting rx refills you've never prescribed Omeprazole # 30 3 refills (06/05/2018), Levothyroxine #90 no refills(10/13/18),and pravastatin #90 no refills(10/13/18). Her PCP is [facility].

## 2019-01-08 NOTE — Telephone Encounter (Signed)
Dr. Carollee Herter is listed as PCP but she saw Dr. Deborra Medina for an establish care/TOC appt. Will route to Dr. Deborra Medina

## 2019-04-15 ENCOUNTER — Other Ambulatory Visit: Payer: Self-pay | Admitting: Family Medicine

## 2019-04-15 DIAGNOSIS — E785 Hyperlipidemia, unspecified: Secondary | ICD-10-CM

## 2019-04-15 DIAGNOSIS — E039 Hypothyroidism, unspecified: Secondary | ICD-10-CM

## 2019-04-16 NOTE — Telephone Encounter (Signed)
YL-Plz see refill req/thx dmf

## 2019-04-16 NOTE — Telephone Encounter (Signed)
Pt switched pcp to Dr Deborra Medina

## 2019-04-17 NOTE — Telephone Encounter (Signed)
Nancy Cuevas--- the pt switched to Dr Deborra Medina as Merryl Hacker

## 2019-05-04 ENCOUNTER — Other Ambulatory Visit: Payer: Self-pay | Admitting: Family Medicine

## 2019-05-04 DIAGNOSIS — E039 Hypothyroidism, unspecified: Secondary | ICD-10-CM

## 2019-05-04 DIAGNOSIS — I10 Essential (primary) hypertension: Secondary | ICD-10-CM

## 2019-05-04 DIAGNOSIS — E785 Hyperlipidemia, unspecified: Secondary | ICD-10-CM

## 2019-05-18 ENCOUNTER — Other Ambulatory Visit: Payer: Self-pay | Admitting: Family Medicine

## 2019-05-18 DIAGNOSIS — R1310 Dysphagia, unspecified: Secondary | ICD-10-CM

## 2019-07-25 ENCOUNTER — Telehealth: Payer: Self-pay

## 2019-07-25 MED ORDER — ZOSTER VAC RECOMB ADJUVANTED 50 MCG/0.5ML IM SUSR: 0.5000 mL | Freq: Once | INTRAMUSCULAR | 1 refills | Status: AC

## 2019-07-25 NOTE — Telephone Encounter (Signed)
Patient insurance covers shingles vaccination, patient requesting orders please send to pharmacy and would like a follow up call when orders are sent  [facility] [id number] [place], [place] - [place] [phone] (Phone) [phone] (Fax)

## 2019-07-25 NOTE — Telephone Encounter (Signed)
eRx sent to pharmacy on file as requested.

## 2019-07-25 NOTE — Telephone Encounter (Signed)
TA-Pt called asking if she can receive the shingrix here in office if she finds out if it is covered here/and if it is not covered here but covered at the pharmacy can we send in the Rx for it? Plz advise/thx dmf

## 2019-07-26 NOTE — Telephone Encounter (Signed)
Pt is aware her insurance will be covering the vaccine and she's going to her pharmacy.

## 2019-07-27 ENCOUNTER — Other Ambulatory Visit: Payer: Self-pay

## 2019-07-27 DIAGNOSIS — E039 Hypothyroidism, unspecified: Secondary | ICD-10-CM

## 2019-07-27 DIAGNOSIS — E785 Hyperlipidemia, unspecified: Secondary | ICD-10-CM

## 2019-07-27 MED ORDER — PRAVASTATIN SODIUM 40 MG PO TABS: 40.0000 mg | ORAL_TABLET | Freq: Every evening | ORAL | 1 refills | Status: DC

## 2019-07-27 MED ORDER — LEVOTHYROXINE SODIUM 50 MCG PO TABS: 50.0000 ug | ORAL_TABLET | Freq: Every day | ORAL | 1 refills | Status: DC

## 2019-08-20 ENCOUNTER — Other Ambulatory Visit: Payer: Self-pay | Admitting: Family Medicine

## 2019-08-20 DIAGNOSIS — Z1231 Encounter for screening mammogram for malignant neoplasm of breast: Secondary | ICD-10-CM

## 2019-08-24 ENCOUNTER — Ambulatory Visit: Payer: Medicare Other

## 2019-09-04 ENCOUNTER — Other Ambulatory Visit: Payer: Self-pay

## 2019-09-04 DIAGNOSIS — M810 Age-related osteoporosis without current pathological fracture: Secondary | ICD-10-CM | POA: Insufficient documentation

## 2019-09-04 DIAGNOSIS — K222 Esophageal obstruction: Secondary | ICD-10-CM | POA: Insufficient documentation

## 2019-09-04 DIAGNOSIS — Z0000 Encounter for general adult medical examination without abnormal findings: Secondary | ICD-10-CM | POA: Insufficient documentation

## 2019-09-04 DIAGNOSIS — I471 Supraventricular tachycardia, unspecified: Secondary | ICD-10-CM | POA: Insufficient documentation

## 2019-09-04 DIAGNOSIS — M8580 Other specified disorders of bone density and structure, unspecified site: Secondary | ICD-10-CM | POA: Insufficient documentation

## 2019-09-04 HISTORY — DX: Encounter for general adult medical examination without abnormal findings: Z00.00

## 2019-09-04 NOTE — Progress Notes (Deleted)
Subjective:   Patient ID: Nancy Cuevas, female    DOB: Aug 12, 1946, 73 y.o.   MRN: [id number]  Nancy Cuevas is a pleasant 73 y.o. year old female who presents to clinic today with Annual Exam and Follow-up  on 09/05/2019  HPI:  Last saw patient when she established care with me on 08/31/18.  Note reviewed.  Health Maintenance  Topic Date Due  . TETANUS/TDAP  02/06/2018  . INFLUENZA VACCINE  05/19/2019  . MAMMOGRAM  06/13/2019  . COLONOSCOPY  06/24/2020  . DEXA SCAN  Completed  . Hepatitis C Screening  Completed  . PNA vac Low Risk Adult  Completed   Mammogram scheduled for 10/22/19 Osteopenia- last DEXA 07/05/17- showed osteopenia of left femur neck.  Hypothyroidism- has been clinically euthyroid on synthroid 50 mcg daily.  Has been on this dose for over 10 years. Denies any symptoms of  Hypo or hyperthyroidism. Lab Results  Component Value Date   TSH 2.28 06/05/2018   HLD- compliant with pravachol 40 mg daily. Lab Results  Component Value Date   CHOL 213 (H) 06/05/2018   HDL 81.30 06/05/2018   LDLCALC 113 (H) 06/05/2018   LDLDIRECT 112.9 03/23/2012   TRIG 95.0 06/05/2018   CHOLHDL 3 06/05/2018   Lab Results  Component Value Date   ALT 19 06/05/2018   AST 24 06/05/2018   ALKPHOS 76 06/05/2018   BILITOT 0.5 06/05/2018   Dysphagia-  When I saw her in 08/2018, she complained of food getting stuck and symptoms were progressing.  Referred her to see Dr. Carlean Purl for endoscopy/further work up who she saw on 09/05/18.  Note reviewed- endoscopy performed on 09/18/18- procedure note reviewed.  Moderate stenosis was found at the gastro- esophageal junction and  successful dilation was performed.  Advised to stay on omeprazole to decrease recurrence.     Palpitations- on metoprolol 1/2 tab twice daily.  Has been taking this since 08/07/2013 - started by Dr. Aundra Dubin.  Note reviewed. She has been taking it twice daily scheduled, not as needed as it is written.   Current Outpatient  Medications on File Prior to Visit  Medication Sig Dispense Refill  . acetaminophen (TYLENOL) 500 MG tablet Take 500 mg by mouth daily as needed for moderate pain or headache.    Marland Kitchen aspirin 81 MG tablet Take 81 mg by mouth every evening.      Marland Kitchen CALCIUM-MAGNESIUM-VITAMIN D PO Take 1 tablet by mouth daily.    . ferrous sulfate 325 (65 FE) MG tablet Take 325 mg by mouth daily.    . Flaxseed, Linseed, (FLAX SEED OIL) 1000 MG CAPS Take 1,000 mg by mouth daily.     Marland Kitchen FLUAD 0.5 ML SUSY ADM 0.5ML IM UTD  0  . fluticasone (FLONASE) 50 MCG/ACT nasal spray Place 2 sprays into both nostrils daily. (Patient taking differently: Place 1 spray into both nostrils daily as needed for allergies. ) 16 g 1  . Hypromellose (ARTIFICIAL TEARS OP) Apply 1 drop to eye daily as needed (dry eyes).    Marland Kitchen levothyroxine (SYNTHROID) 50 MCG tablet Take 1 tablet (50 mcg total) by mouth daily. 90 tablet 1  . metoprolol tartrate (LOPRESSOR) 25 MG tablet TAKE 1/2 TABLET BY MOUTH TWICE DAILY, MAY TAKE AN EXTRA 1/2 TABLET FOR PALPITATIONS AS NEEDED 135 tablet 0  . Multiple Vitamin (MULTIVITAMIN) tablet Take 1 tablet by mouth daily.      Marland Kitchen neomycin-bacitracin-polymyxin (NEOSPORIN) ointment Apply 1 application topically daily as needed for wound  care.    . Omega-3 Fatty Acids (FISH OIL) 1000 MG CAPS Take 1 capsule by mouth 2 (two) times daily.     Marland Kitchen omeprazole (PRILOSEC) 20 MG capsule TAKE 1 CAPSULE(20 MG) BY MOUTH DAILY 30 capsule 3  . pravastatin (PRAVACHOL) 40 MG tablet Take 1 tablet (40 mg total) by mouth every evening. 90 tablet 1   No current facility-administered medications on file prior to visit.     Allergies  Allergen Reactions  . Sulfonamide Derivatives Rash    Past Medical History:  Diagnosis Date  . 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 Bunion   . Contact dermatitis and other eczema, due to unspecified cause   . Contact dermatitis and other eczema, due to unspecified cause   . Disorder of bone and  cartilage, unspecified   . Female stress incontinence   . HYPERLIPIDEMIA 10/27/2010  . Hyperpotassemia   . HYPOTHYROIDISM 07/18/2007  . Nonspecific (abnormal) findings on radiological and other examination of other intrathoracic organs   . OSTEOPENIA 03/24/2008  . Palpitations    Echo (1/12) EF 0000000, mild diastolic dysfunction, atrial septal aneurysm. Holter (1/12): short runs of atrial tachycardia versus MAT. ;  Echo (14) EF 55-60%, normal wall motion, mild MR, mild LAE, atrial septal aneurysm  . Personal history of colonic and rectal adenomas 03/22/2013  . POSTMENOPAUSAL STATUS 02/07/2008  . Symptomatic menopausal or female climacteric states     Past Surgical History:  Procedure Laterality Date  . ABDOMINAL HYSTERECTOMY  1988   prolapse uterus  . CARPAL TUNNEL RELEASE  11/15/2011   Procedure: CARPAL TUNNEL RELEASE;  Surgeon: Tennis Must, MD;  Location: [facility];  Service: Orthopedics;  Laterality: Left;  . CATARACT EXTRACTION  2011  . COLONOSCOPY  [id number]   normal,  . COLONOSCOPY WITH PROPOFOL N/A 06/24/2017   Procedure: COLONOSCOPY WITH PROPOFOL;  Surgeon: Gatha Mayer, MD;  Location: [facility];  Service: Endoscopy;  Laterality: N/A;  . FOOT SURGERY Right 10/2014  . FRACTURE SURGERY  june 2006   left wrist  . HAND SURGERY  oct 2012 and jan 2013   Dr Dean--[facility]  . HOT HEMOSTASIS N/A 06/24/2017   Procedure: HOT HEMOSTASIS (ARGON PLASMA COAGULATION/BICAP);  Surgeon: Gatha Mayer, MD;  Location: [facility];  Service: Endoscopy;  Laterality: N/A;  . left wrist hardware removal 10/12  10/12  . natural birth  1962, 1972  . PARTIAL PROCTECTOMY BY TEM  06/22/2013   Procedure: PARTIAL PROCTECTOMY BY TEM;  Surgeon: Leighton Ruff, MD;  Location: [facility];  Service: General;;  . RETINAL DETACHMENT SURGERY  2009   left    Family History  Problem Relation Age of Onset  . Diabetes Mother        1st degree relative  . Hypertension Mother   . Stroke Mother    . Heart disease Father   . Breast cancer Unknown        1st degree relative <50  . Osteoporosis Unknown        1st degree relative  . Cancer Maternal Aunt   . Cancer Cousin   . Heart disease Cousin   . Alzheimer's disease Cousin   . Colon cancer Neg Hx   . Esophageal cancer Neg Hx   . Stomach cancer Neg Hx   . Rectal cancer Neg Hx     Social History   Socioeconomic History  . Marital status: Married    Spouse name: Not on file  .  Number of children: 2  . Years of education: Not on file  . Highest education level: Not on file  Occupational History  . Not on file  Social Needs  . Financial resource strain: Not on file  . Food insecurity    Worry: Not on file    Inability: Not on file  . Transportation needs    Medical: Not on file    Non-medical: Not on file  Tobacco Use  . Smoking status: Never Smoker  . Smokeless tobacco: Never Used  Substance and Sexual Activity  . Alcohol use: No  . Drug use: No  . Sexual activity: Not Currently  Lifestyle  . Physical activity    Days per week: Not on file    Minutes per session: Not on file  . Stress: Not on file  Relationships  . Social Herbalist on phone: Not on file    Gets together: Not on file    Attends religious service: Not on file    Active member of club or organization: Not on file    Attends meetings of clubs or organizations: Not on file    Relationship status: Not on file  . Intimate partner violence    Fear of current or ex partner: Not on file    Emotionally abused: Not on file    Physically abused: Not on file    Forced sexual activity: Not on file  Other Topics Concern  . Not on file  Social History Narrative   Married one son one daughter and 3 grandchildren    Likes to travel   No alcohol tobacco or drug use reported   exercise-- not regular    The PMH, PSH, Social History, Family History, Medications, and allergies have been reviewed in [facility], and have been updated if relevant.    Review of Systems  Constitutional: Negative.   HENT: Negative.   Eyes: Negative.   Respiratory: Negative.   Cardiovascular: Negative.   Gastrointestinal: Negative.   Endocrine: Negative.   Genitourinary: Negative.   Musculoskeletal: Negative.   Skin: Negative.   Allergic/Immunologic: Negative.   Neurological: Negative.   Hematological: Negative.   Psychiatric/Behavioral: Negative.   All other systems reviewed and are negative.      Objective:    There were no vitals taken for this visit.   Physical Exam Vitals signs and nursing note reviewed.  Constitutional:      General: She is not in acute distress.    Appearance: She is well-developed. She is not diaphoretic.  HENT:     Head: Normocephalic and atraumatic.  Neck:     Musculoskeletal: Normal range of motion.  Cardiovascular:     Rate and Rhythm: Regular rhythm. Bradycardia present.  Pulmonary:     Effort: Pulmonary effort is normal.     Breath sounds: Normal breath sounds.  Abdominal:     General: Bowel sounds are normal.     Palpations: Abdomen is soft.  Musculoskeletal: Normal range of motion.  Skin:    General: Skin is warm and dry.  Neurological:     Mental Status: She is alert and oriented to person, place, and time.     Cranial Nerves: No cranial nerve deficit.  Psychiatric:        Behavior: Behavior normal.        Thought Content: Thought content normal.        Judgment: Judgment normal.           Assessment &  Plan:   No diagnosis found. No follow-ups on file.

## 2019-09-04 NOTE — Patient Instructions (Addendum)
Great to see you. I will call you with your lab results from today and you can view them online.   Please call the breast center at [phone] to schedule your bone density- you can ask if you can have it done on the same day that your mammogram is already scheduled.  Try adding desonide ointment twice daily but for no longer than 10 days without an update.

## 2019-09-05 ENCOUNTER — Ambulatory Visit (INDEPENDENT_AMBULATORY_CARE_PROVIDER_SITE_OTHER): Payer: Medicare Other | Admitting: Family Medicine

## 2019-09-05 ENCOUNTER — Encounter: Payer: Self-pay | Admitting: Family Medicine

## 2019-09-05 VITALS — BP 138/70 | HR 55 | Temp 98.7°F | Ht 63.25 in | Wt 140.6 lb

## 2019-09-05 DIAGNOSIS — K222 Esophageal obstruction: Secondary | ICD-10-CM | POA: Diagnosis not present

## 2019-09-05 DIAGNOSIS — M85852 Other specified disorders of bone density and structure, left thigh: Secondary | ICD-10-CM | POA: Diagnosis not present

## 2019-09-05 DIAGNOSIS — E039 Hypothyroidism, unspecified: Secondary | ICD-10-CM | POA: Diagnosis not present

## 2019-09-05 DIAGNOSIS — I471 Supraventricular tachycardia: Secondary | ICD-10-CM

## 2019-09-05 DIAGNOSIS — M899 Disorder of bone, unspecified: Secondary | ICD-10-CM | POA: Diagnosis not present

## 2019-09-05 DIAGNOSIS — H9393 Unspecified disorder of ear, bilateral: Secondary | ICD-10-CM | POA: Insufficient documentation

## 2019-09-05 DIAGNOSIS — Z0000 Encounter for general adult medical examination without abnormal findings: Secondary | ICD-10-CM

## 2019-09-05 DIAGNOSIS — M949 Disorder of cartilage, unspecified: Secondary | ICD-10-CM | POA: Diagnosis not present

## 2019-09-05 DIAGNOSIS — E785 Hyperlipidemia, unspecified: Secondary | ICD-10-CM

## 2019-09-05 LAB — CBC WITH DIFFERENTIAL/PLATELET
Basophils Absolute: 0 10*3/uL (ref 0.0–0.1)
Basophils Relative: 0.6 % (ref 0.0–3.0)
Eosinophils Absolute: 0.1 10*3/uL (ref 0.0–0.7)
Eosinophils Relative: 2.3 % (ref 0.0–5.0)
HCT: 36.6 % (ref 36.0–46.0)
Hemoglobin: 12.4 g/dL (ref 12.0–15.0)
Lymphocytes Relative: 34.6 % (ref 12.0–46.0)
Lymphs Abs: 1.8 10*3/uL (ref 0.7–4.0)
MCHC: 33.8 g/dL (ref 30.0–36.0)
MCV: 94.3 fl (ref 78.0–100.0)
Monocytes Absolute: 0.6 10*3/uL (ref 0.1–1.0)
Monocytes Relative: 12 % (ref 3.0–12.0)
Neutro Abs: 2.7 10*3/uL (ref 1.4–7.7)
Neutrophils Relative %: 50.5 % (ref 43.0–77.0)
Platelets: 260 10*3/uL (ref 150.0–400.0)
RBC: 3.88 Mil/uL (ref 3.87–5.11)
RDW: 12.8 % (ref 11.5–15.5)
WBC: 5.3 10*3/uL (ref 4.0–10.5)

## 2019-09-05 LAB — COMPREHENSIVE METABOLIC PANEL
ALT: 17 U/L (ref 0–35)
AST: 22 U/L (ref 0–37)
Albumin: 4.4 g/dL (ref 3.5–5.2)
Alkaline Phosphatase: 75 U/L (ref 39–117)
BUN: 16 mg/dL (ref 6–23)
CO2: 33 mEq/L — ABNORMAL HIGH (ref 19–32)
Calcium: 9.5 mg/dL (ref 8.4–10.5)
Chloride: 100 mEq/L (ref 96–112)
Creatinine, Ser: 0.81 mg/dL (ref 0.40–1.20)
GFR: 68.89 mL/min (ref >60.00)
Glucose, Bld: 78 mg/dL (ref 70–99)
Potassium: 3.8 mEq/L (ref 3.5–5.1)
Sodium: 141 mEq/L (ref 135–145)
Total Bilirubin: 0.5 mg/dL (ref 0.2–1.2)
Total Protein: 6.9 g/dL (ref 6.0–8.3)

## 2019-09-05 LAB — LIPID PANEL
Cholesterol: 159 mg/dL (ref 0–200)
HDL: 64.5 mg/dL (ref >39.00)
LDL Cholesterol: 75 mg/dL (ref 0–99)
NonHDL: 94.92
Total CHOL/HDL Ratio: 2
Triglycerides: 98 mg/dL (ref 0.0–149.0)
VLDL: 19.6 mg/dL (ref 0.0–40.0)

## 2019-09-05 LAB — TSH: TSH: 3.68 u[IU]/mL (ref 0.35–4.50)

## 2019-09-05 LAB — VITAMIN D 25 HYDROXY (VIT D DEFICIENCY, FRACTURES): VITD: 30.33 ng/mL (ref 30.00–100.00)

## 2019-09-05 LAB — T4, FREE: Free T4: 1.11 ng/dL (ref 0.60–1.60)

## 2019-09-05 MED ORDER — DESONIDE 0.05 % EX CREA: TOPICAL_CREAM | Freq: Two times a day (BID) | CUTANEOUS | 0 refills | Status: AC

## 2019-09-05 NOTE — Progress Notes (Signed)
Subjective:    Patient ID: Nancy Cuevas, female    DOB: Sep 10, 1946, 73 y.o.   MRN: [id number]  Chief Complaint  Patient presents with  . Annual Exam    Not fasting   . Follow-up    HPI Patient is in today for an annual exam. She presents with no complaints. Patient active and exercise daily, she  denies chest pain, SOB, dizziness, and palpitations. Patient will have her mammogram completed this year. Doing better on 12.5mg  daily of the metoprolol. Swallowing since last visit is better. Seen Dr. Cathleen Corti for her ears was given medication that did not give any relief.   Health Maintenance  Topic Date Due  . TETANUS/TDAP  02/06/2018  . MAMMOGRAM  06/13/2019  . COLONOSCOPY  06/24/2020  . INFLUENZA VACCINE  Completed  . DEXA SCAN  Completed  . Hepatitis C Screening  Completed  . PNA vac Low Risk Adult  Completed   Depression screen [facility] 2/9 09/05/2019 06/05/2018 03/17/2017 05/25/2016 10/24/2015  Decreased Interest 0 0 0 0 0  Down, Depressed, Hopeless 0 0 0 0 0  PHQ - 2 Score 0 0 0 0 0     Past Medical History:  Diagnosis Date  . 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 Bunion   . Contact dermatitis and other eczema, due to unspecified cause   . Contact dermatitis and other eczema, due to unspecified cause   . Disorder of bone and cartilage, unspecified   . Female stress incontinence   . HYPERLIPIDEMIA 10/27/2010  . Hyperpotassemia   . HYPOTHYROIDISM 07/18/2007  . Nonspecific (abnormal) findings on radiological and other examination of other intrathoracic organs   . OSTEOPENIA 03/24/2008  . Palpitations    Echo (1/12) EF 0000000, mild diastolic dysfunction, atrial septal aneurysm. Holter (1/12): short runs of atrial tachycardia versus MAT. ;  Echo (14) EF 55-60%, normal wall motion, mild MR, mild LAE, atrial septal aneurysm  . Personal history of colonic and rectal adenomas 03/22/2013  . POSTMENOPAUSAL STATUS 02/07/2008  . Symptomatic menopausal or female climacteric  states     Past Surgical History:  Procedure Laterality Date  . ABDOMINAL HYSTERECTOMY  1988   prolapse uterus  . CARPAL TUNNEL RELEASE  11/15/2011   Procedure: CARPAL TUNNEL RELEASE;  Surgeon: Tennis Must, MD;  Location: [place];  Service: Orthopedics;  Laterality: Left;  . CATARACT EXTRACTION  2011  . COLONOSCOPY  [id number]   normal,  . COLONOSCOPY WITH PROPOFOL N/A 06/24/2017   Procedure: COLONOSCOPY WITH PROPOFOL;  Surgeon: Gatha Mayer, MD;  Location: [facility];  Service: Endoscopy;  Laterality: N/A;  . FOOT SURGERY Right 10/2014  . FRACTURE SURGERY  june 2006   left wrist  . HAND SURGERY  oct 2012 and jan 2013   Dr Dean--[facility]  . HOT HEMOSTASIS N/A 06/24/2017   Procedure: HOT HEMOSTASIS (ARGON PLASMA COAGULATION/BICAP);  Surgeon: Gatha Mayer, MD;  Location: [facility];  Service: Endoscopy;  Laterality: N/A;  . left wrist hardware removal 10/12  10/12  . natural birth  1962, 1972  . PARTIAL PROCTECTOMY BY TEM  06/22/2013   Procedure: PARTIAL PROCTECTOMY BY TEM;  Surgeon: Leighton Ruff, MD;  Location: [facility];  Service: General;;  . RETINAL DETACHMENT SURGERY  2009   left    Family History  Problem Relation Age of Onset  . Diabetes Mother        1st degree relative  . Hypertension Mother   .  Stroke Mother   . Heart disease Father   . Breast cancer Unknown        1st degree relative <50  . Osteoporosis Unknown        1st degree relative  . Cancer Maternal Aunt   . Cancer Cousin   . Heart disease Cousin   . Alzheimer's disease Cousin   . Colon cancer Neg Hx   . Esophageal cancer Neg Hx   . Stomach cancer Neg Hx   . Rectal cancer Neg Hx     Social History   Socioeconomic History  . Marital status: Married    Spouse name: Not on file  . Number of children: 2  . Years of education: Not on file  . Highest education level: Not on file  Occupational History  . Not on file  Social Needs  . Financial resource strain: Not on file   . Food insecurity    Worry: Not on file    Inability: Not on file  . Transportation needs    Medical: Not on file    Non-medical: Not on file  Tobacco Use  . Smoking status: Never Smoker  . Smokeless tobacco: Never Used  Substance and Sexual Activity  . Alcohol use: No  . Drug use: No  . Sexual activity: Not Currently  Lifestyle  . Physical activity    Days per week: Not on file    Minutes per session: Not on file  . Stress: Not on file  Relationships  . Social Herbalist on phone: Not on file    Gets together: Not on file    Attends religious service: Not on file    Active member of club or organization: Not on file    Attends meetings of clubs or organizations: Not on file    Relationship status: Not on file  . Intimate partner violence    Fear of current or ex partner: Not on file    Emotionally abused: Not on file    Physically abused: Not on file    Forced sexual activity: Not on file  Other Topics Concern  . Not on file  Social History Narrative   Married one son one daughter and 3 grandchildren    Likes to travel   No alcohol tobacco or drug use reported   exercise-- not regular     Outpatient Medications Prior to Visit  Medication Sig Dispense Refill  . acetaminophen (TYLENOL) 500 MG tablet Take 500 mg by mouth daily as needed for moderate pain or headache.    Marland Kitchen aspirin 81 MG tablet Take 81 mg by mouth every evening.      Marland Kitchen CALCIUM-MAGNESIUM-VITAMIN D PO Take 1 tablet by mouth daily.    . ferrous sulfate 325 (65 FE) MG tablet Take 325 mg by mouth daily.    . Flaxseed, Linseed, (FLAX SEED OIL) 1000 MG CAPS Take 1,000 mg by mouth daily.     . Hypromellose (ARTIFICIAL TEARS OP) Apply 1 drop to eye daily as needed (dry eyes).    Marland Kitchen levothyroxine (SYNTHROID) 50 MCG tablet Take 1 tablet (50 mcg total) by mouth daily. 90 tablet 1  . metoprolol tartrate (LOPRESSOR) 25 MG tablet TAKE 1/2 TABLET BY MOUTH TWICE DAILY, MAY TAKE AN EXTRA 1/2 TABLET FOR  PALPITATIONS AS NEEDED 135 tablet 0  . Multiple Vitamin (MULTIVITAMIN) tablet Take 1 tablet by mouth daily.      Marland Kitchen neomycin-bacitracin-polymyxin (NEOSPORIN) ointment Apply 1 application topically daily as  needed for wound care.    . Omega-3 Fatty Acids (FISH OIL) 1000 MG CAPS Take 1 capsule by mouth 2 (two) times daily.     Marland Kitchen omeprazole (PRILOSEC) 20 MG capsule TAKE 1 CAPSULE(20 MG) BY MOUTH DAILY 30 capsule 3  . pravastatin (PRAVACHOL) 40 MG tablet Take 1 tablet (40 mg total) by mouth every evening. 90 tablet 1  . fluticasone (FLONASE) 50 MCG/ACT nasal spray Place 2 sprays into both nostrils daily. (Patient not taking: Reported on 09/05/2019) 16 g 1  . FLUAD 0.5 ML SUSY ADM 0.5ML IM UTD  0   No facility-administered medications prior to visit.     Allergies  Allergen Reactions  . Sulfonamide Derivatives Rash    Review of Systems  Constitutional: Negative for fever and malaise/fatigue.  HENT: Negative for congestion and hearing loss.   Eyes: Negative for blurred vision, discharge and redness.  Respiratory: Negative for cough and shortness of breath.   Cardiovascular: Negative for chest pain, palpitations and leg swelling.  Gastrointestinal: Negative for abdominal pain and heartburn.  Genitourinary: Negative for dysuria.  Musculoskeletal: Negative for falls and myalgias.  Skin: Positive for rash.  Neurological: Negative for dizziness, loss of consciousness and headaches.  Endo/Heme/Allergies: Does not bruise/bleed easily.  Psychiatric/Behavioral: Negative for depression.  All other systems reviewed and are negative.      Objective:    Physical Exam Vitals signs and nursing note reviewed.  Constitutional:      Appearance: Normal appearance. She is not ill-appearing.  HENT:     Head: Normocephalic and atraumatic.     Comments: Dry skin in ear canals bilaterally    Right Ear: External ear normal.     Left Ear: External ear normal.     Nose: Nose normal.  Eyes:     General:         Right eye: No discharge.        Left eye: No discharge.  Cardiovascular:     Rate and Rhythm: Normal rate and regular rhythm.     Heart sounds: Normal heart sounds.  Pulmonary:     Effort: Pulmonary effort is normal.     Breath sounds: No wheezing.  Abdominal:     Palpations: Abdomen is soft. There is no mass.     Tenderness: There is no abdominal tenderness. There is no guarding.  Musculoskeletal: Normal range of motion.     Right lower leg: No edema.     Left lower leg: No edema.  Skin:    General: Skin is dry.  Neurological:     Mental Status: She is alert and oriented to person, place, and time.     Deep Tendon Reflexes: Reflexes normal.  Psychiatric:        Mood and Affect: Mood normal.        Behavior: Behavior normal.     BP 138/70 (BP Location: Left Arm, Patient Position: Sitting, Cuff Size: Normal)   Pulse (!) 55   Temp 98.7 F (37.1 C) (Oral)   Ht 5' 3.25" (1.607 m)   Wt 140 lb 9.6 oz (63.8 kg)   SpO2 98%   BMI 24.71 kg/m  Wt Readings from Last 3 Encounters:  09/05/19 140 lb 9.6 oz (63.8 kg)  12/28/18 144 lb 3.2 oz (65.4 kg)  09/18/18 145 lb (65.8 kg)     Lab Results  Component Value Date   WBC 6.4 06/05/2018   HGB 12.7 06/05/2018   HCT 38.5 06/05/2018   PLT 263.0 06/05/2018  GLUCOSE 107 (H) 06/05/2018   CHOL 213 (H) 06/05/2018   TRIG 95.0 06/05/2018   HDL 81.30 06/05/2018   LDLDIRECT 112.9 03/23/2012   LDLCALC 113 (H) 06/05/2018   ALT 19 06/05/2018   AST 24 06/05/2018   NA 144 06/05/2018   K 4.7 06/05/2018   CL 104 06/05/2018   CREATININE 0.86 06/05/2018   BUN 22 06/05/2018   CO2 30 06/05/2018   TSH 2.28 06/05/2018    Lab Results  Component Value Date   TSH 2.28 06/05/2018   Lab Results  Component Value Date   WBC 6.4 06/05/2018   HGB 12.7 06/05/2018   HCT 38.5 06/05/2018   MCV 96.0 06/05/2018   PLT 263.0 06/05/2018   Lab Results  Component Value Date   NA 144 06/05/2018   K 4.7 06/05/2018   CO2 30 06/05/2018    GLUCOSE 107 (H) 06/05/2018   BUN 22 06/05/2018   CREATININE 0.86 06/05/2018   BILITOT 0.5 06/05/2018   ALKPHOS 76 06/05/2018   AST 24 06/05/2018   ALT 19 06/05/2018   PROT 7.5 06/05/2018   ALBUMIN 4.7 06/05/2018   CALCIUM 10.5 06/05/2018   GFR 68.56 06/05/2018   Lab Results  Component Value Date   CHOL 213 (H) 06/05/2018   Lab Results  Component Value Date   HDL 81.30 06/05/2018   Lab Results  Component Value Date   LDLCALC 113 (H) 06/05/2018   Lab Results  Component Value Date   TRIG 95.0 06/05/2018   Lab Results  Component Value Date   CHOLHDL 3 06/05/2018   No results found for: HGBA1C     Assessment & Plan:   Problem List Items Addressed This Visit      Active Problems   Hypothyroidism    Clinically euthyroid. Due for labs today. Orders Placed This Encounter  Procedures  . DG Bone Density  . TSH  . T4, free  . Well woman- non DM- CBC  . Well woman- non DM- CMET  . Well woman- non DM- lipid  . Vitamin D (25 hydroxy)          Relevant Orders   TSH   T4, free   Disorder of bone and cartilage   Relevant Orders   Vitamin D (25 hydroxy)   DG Bone Density   Hyperlipidemia    Tolerating statin, encouraged heart healthy diet, avoid trans fats, minimize simple carbs and saturated fats. Increase exercise as tolerated  Orders Placed This Encounter  Procedures  . DG Bone Density  . TSH  . T4, free  . Well woman- non DM- CBC  . Well woman- non DM- CMET  . Well woman- non DM- lipid  . Vitamin D (25 hydroxy)         Relevant Orders   Well woman- non DM- CBC   Well woman- non DM- CMET   Well woman- non DM- lipid   Atrial tachycardia ([place])    Palpitations controlled on 1/2 dose of metoprolol.      Stricture and stenosis of esophagus    Endoscopy on 09/18/18- moderate stenosis was found at gastro- esophageal junction and successful dilation performed.  She has stayed on omeprazole and has no further symptoms.      Osteopenia    DEXA ordered  and phone number for breast center given to pt to schedule her own DEXA on same day as mammogram. The patient indicates understanding of these issues and agrees with the plan.  Relevant Orders   Vitamin D (25 hydroxy)   DG Bone Density   Well woman exam without gynecological exam - Primary    Reviewed preventive care protocols, scheduled due services, and updated immunizations Discussed nutrition, exercise, diet, and healthy lifestyle.       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  Will try budesonide cream twice daily for no longer than 10 days without updating me. The patient indicates understanding of these issues and agrees with the plan.          I have discontinued Wynona Neat. Squillace's Fluad. I am also having her start on desonide. Additionally, I am having her maintain her multivitamin, Flax Seed Oil, aspirin, Fish Oil, fluticasone, ferrous sulfate, CALCIUM-MAGNESIUM-VITAMIN D PO, acetaminophen, Hypromellose (ARTIFICIAL TEARS OP), neomycin-bacitracin-polymyxin, metoprolol tartrate, omeprazole, levothyroxine, and pravastatin.  Meds ordered this encounter  Medications  . desonide (DESOWEN) 0.05 % cream    Sig: Apply topically 2 (two) times daily.    Dispense:  30 g    Refill:  0   The above documentation has been reviewed and is accurate and complete Arnette Norris, MD   Arnette Norris, MD

## 2019-09-05 NOTE — Assessment & Plan Note (Signed)
Endoscopy on 09/18/18- moderate stenosis was found at gastro- esophageal junction and successful dilation performed.  She has stayed on omeprazole and has no further symptoms.

## 2019-09-05 NOTE — Assessment & Plan Note (Signed)
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  Will try budesonide cream twice daily for no longer than 10 days without updating me. The patient indicates understanding of these issues and agrees with the plan.

## 2019-09-05 NOTE — Assessment & Plan Note (Signed)
Clinically euthyroid. Due for labs today. Orders Placed This Encounter  Procedures  . DG Bone Density  . TSH  . T4, free  . Well woman- non DM- CBC  . Well woman- non DM- CMET  . Well woman- non DM- lipid  . Vitamin D (25 hydroxy)

## 2019-09-05 NOTE — Assessment & Plan Note (Signed)
Reviewed preventive care protocols, scheduled due services, and updated immunizations Discussed nutrition, exercise, diet, and healthy lifestyle.  

## 2019-09-05 NOTE — Assessment & Plan Note (Signed)
Tolerating statin, encouraged heart healthy diet, avoid trans fats, minimize simple carbs and saturated fats. Increase exercise as tolerated  Orders Placed This Encounter  Procedures  . DG Bone Density  . TSH  . T4, free  . Well woman- non DM- CBC  . Well woman- non DM- CMET  . Well woman- non DM- lipid  . Vitamin D (25 hydroxy)

## 2019-09-05 NOTE — Assessment & Plan Note (Signed)
DEXA ordered and phone number for breast center given to pt to schedule her own DEXA on same day as mammogram. The patient indicates understanding of these issues and agrees with the plan.

## 2019-09-05 NOTE — Assessment & Plan Note (Signed)
Palpitations controlled on 1/2 dose of metoprolol.

## 2019-09-25 ENCOUNTER — Other Ambulatory Visit: Payer: Self-pay | Admitting: Family Medicine

## 2019-09-25 DIAGNOSIS — R1310 Dysphagia, unspecified: Secondary | ICD-10-CM

## 2019-09-25 MED ORDER — OMEPRAZOLE 20 MG PO CPDR: DELAYED_RELEASE_CAPSULE | ORAL | 3 refills | Status: DC

## 2019-09-25 NOTE — Telephone Encounter (Signed)
Medication Refill - Medication:  omeprazole (PRILOSEC) 20 MG capsule  Pt will be out of medication tomorrow, has requested this since last Thursday.  Has the patient contacted their pharmacy? Yes.   (Agent: If no, request that the patient contact the pharmacy for the refill.) (Agent: If yes, when and what did the pharmacy advise?)  Preferred Pharmacy (with phone number or street name):  [facility] RO:[id number] [place], [place] [address] [place]  [address] [place] [address]  Phone: [phone] Fax: [phone]     Agent: Please be advised that RX refills may take up to 3 business days. We ask that you follow-up with your pharmacy.

## 2019-10-22 ENCOUNTER — Ambulatory Visit: Payer: Medicare Other

## 2019-11-16 ENCOUNTER — Ambulatory Visit: Payer: Medicare Other

## 2019-11-23 DIAGNOSIS — H25011 Cortical age-related cataract, right eye: Secondary | ICD-10-CM | POA: Diagnosis not present

## 2019-11-23 DIAGNOSIS — H2511 Age-related nuclear cataract, right eye: Secondary | ICD-10-CM | POA: Diagnosis not present

## 2019-11-23 DIAGNOSIS — H524 Presbyopia: Secondary | ICD-10-CM | POA: Diagnosis not present

## 2019-11-23 DIAGNOSIS — H26492 Other secondary cataract, left eye: Secondary | ICD-10-CM | POA: Diagnosis not present

## 2019-11-24 ENCOUNTER — Ambulatory Visit: Payer: Medicare Other

## 2019-12-04 ENCOUNTER — Ambulatory Visit
Admission: RE | Admit: 2019-12-04 | Discharge: 2019-12-04 | Disposition: A | Discharge status: Home / Self Care | Payer: Medicare PPO | Source: Ambulatory Visit | Attending: Family Medicine | Admitting: Family Medicine

## 2019-12-04 ENCOUNTER — Telehealth: Payer: Self-pay | Admitting: Nurse Practitioner

## 2019-12-04 ENCOUNTER — Other Ambulatory Visit: Payer: Self-pay

## 2019-12-04 DIAGNOSIS — M8589 Other specified disorders of bone density and structure, multiple sites: Secondary | ICD-10-CM | POA: Diagnosis not present

## 2019-12-04 DIAGNOSIS — M899 Disorder of bone, unspecified: Secondary | ICD-10-CM

## 2019-12-04 DIAGNOSIS — M85852 Other specified disorders of bone density and structure, left thigh: Secondary | ICD-10-CM

## 2019-12-04 DIAGNOSIS — Z1231 Encounter for screening mammogram for malignant neoplasm of breast: Secondary | ICD-10-CM | POA: Diagnosis not present

## 2019-12-04 DIAGNOSIS — Z780 Asymptomatic menopausal state: Secondary | ICD-10-CM | POA: Diagnosis not present

## 2019-12-04 NOTE — Telephone Encounter (Signed)
Pt request to Transfer from [place] to [place]. Please advise.

## 2019-12-05 DIAGNOSIS — Z03818 Encounter for observation for suspected exposure to other biological agents ruled out: Secondary | ICD-10-CM | POA: Diagnosis not present

## 2019-12-05 NOTE — Telephone Encounter (Signed)
ok 

## 2019-12-05 NOTE — Telephone Encounter (Signed)
Please help pt schedule transfer care appt.

## 2019-12-05 NOTE — Telephone Encounter (Signed)
I called and left message on patient voicemail TOC had been approved to see Wilfred Lacy and can call office to schedule appointment.

## 2020-01-09 ENCOUNTER — Other Ambulatory Visit: Payer: Self-pay | Admitting: Family Medicine

## 2020-01-09 ENCOUNTER — Other Ambulatory Visit: Payer: Self-pay

## 2020-01-09 DIAGNOSIS — E039 Hypothyroidism, unspecified: Secondary | ICD-10-CM

## 2020-01-09 DIAGNOSIS — E785 Hyperlipidemia, unspecified: Secondary | ICD-10-CM

## 2020-01-09 MED ORDER — PRAVASTATIN SODIUM 40 MG PO TABS: ORAL_TABLET | ORAL | 0 refills | Status: DC

## 2020-01-09 MED ORDER — LEVOTHYROXINE SODIUM 50 MCG PO TABS: ORAL_TABLET | ORAL | 0 refills | Status: DC

## 2020-01-11 ENCOUNTER — Other Ambulatory Visit: Payer: Self-pay

## 2020-01-11 DIAGNOSIS — R1310 Dysphagia, unspecified: Secondary | ICD-10-CM

## 2020-01-11 MED ORDER — OMEPRAZOLE 20 MG PO CPDR: DELAYED_RELEASE_CAPSULE | ORAL | 0 refills | Status: DC

## 2020-01-14 ENCOUNTER — Other Ambulatory Visit: Payer: Self-pay | Admitting: Nurse Practitioner

## 2020-01-14 ENCOUNTER — Telehealth: Payer: Self-pay | Admitting: General Practice

## 2020-01-14 DIAGNOSIS — I10 Essential (primary) hypertension: Secondary | ICD-10-CM

## 2020-01-14 MED ORDER — METOPROLOL TARTRATE 25 MG PO TABS: ORAL_TABLET | ORAL | 0 refills | Status: DC

## 2020-01-14 NOTE — Telephone Encounter (Signed)
Patient is calling and requesting a refill for metoprolol sent to [facility] on [address]. Informed patient that she needed to set a [facility] appointment with another provider due to Dr. Deborra Medina leaving the practice. Patient stated that she will call back to set up appointment with [facility]. Pls advise.  CB is [phone]

## 2020-01-14 NOTE — Telephone Encounter (Signed)
45tabs sent

## 2020-01-14 NOTE — Telephone Encounter (Signed)
Pt is aware, she will call to est care prior to this med runs out.

## 2020-01-14 NOTE — Telephone Encounter (Signed)
Baldo Ash please advise  Last refill was 05/06/2020 by Dr. Etter Sjogren.

## 2020-01-31 DIAGNOSIS — H26492 Other secondary cataract, left eye: Secondary | ICD-10-CM | POA: Diagnosis not present

## 2020-03-19 ENCOUNTER — Other Ambulatory Visit: Payer: Self-pay | Admitting: Family Medicine

## 2020-03-19 DIAGNOSIS — E785 Hyperlipidemia, unspecified: Secondary | ICD-10-CM

## 2020-03-28 ENCOUNTER — Other Ambulatory Visit: Payer: Self-pay | Admitting: Nurse Practitioner

## 2020-03-28 DIAGNOSIS — I10 Essential (primary) hypertension: Secondary | ICD-10-CM

## 2020-04-03 ENCOUNTER — Other Ambulatory Visit: Payer: Self-pay | Admitting: Family Medicine

## 2020-04-03 DIAGNOSIS — E039 Hypothyroidism, unspecified: Secondary | ICD-10-CM

## 2020-04-03 NOTE — Telephone Encounter (Signed)
Last OV 09/05/19 Last fill 03/24/#90/1

## 2020-04-06 ENCOUNTER — Other Ambulatory Visit: Payer: Self-pay | Admitting: Family Medicine

## 2020-04-06 DIAGNOSIS — R1310 Dysphagia, unspecified: Secondary | ICD-10-CM

## 2020-04-07 NOTE — Telephone Encounter (Signed)
Last OV 09/05/19 Last fill 01/11/20  #90/0

## 2020-04-10 ENCOUNTER — Other Ambulatory Visit: Payer: Self-pay | Admitting: Nurse Practitioner

## 2020-04-10 DIAGNOSIS — I10 Essential (primary) hypertension: Secondary | ICD-10-CM

## 2020-04-15 ENCOUNTER — Encounter: Payer: Self-pay | Admitting: Family

## 2020-04-15 ENCOUNTER — Other Ambulatory Visit: Payer: Self-pay

## 2020-04-15 ENCOUNTER — Ambulatory Visit: Payer: Medicare PPO | Admitting: Family

## 2020-04-15 VITALS — BP 98/60 | HR 62 | Temp 97.0°F | Ht 63.25 in | Wt 140.6 lb

## 2020-04-15 DIAGNOSIS — E7800 Pure hypercholesterolemia, unspecified: Secondary | ICD-10-CM | POA: Diagnosis not present

## 2020-04-15 DIAGNOSIS — I10 Essential (primary) hypertension: Secondary | ICD-10-CM | POA: Diagnosis not present

## 2020-04-15 DIAGNOSIS — E038 Other specified hypothyroidism: Secondary | ICD-10-CM

## 2020-04-15 MED ORDER — METOPROLOL TARTRATE 25 MG PO TABS: ORAL_TABLET | ORAL | 3 refills | Status: DC

## 2020-04-15 NOTE — Progress Notes (Signed)
Established Patient Office Visit  Subjective:  Patient ID: Nancy Cuevas, female    DOB: [birth year]  Age: 74 y.o. MRN: [id number]  CC:  Chief Complaint  Patient presents with  . Medication Refill    HPI Nancy Cuevas presents for a renewal of her blood pressure medication. She has a history of hypothyroidism, hypertension, palpitations, and hypercholesterolemia. She is planning to establish care with her new PCP July 15th. She is currently tolerating all medications well. Exercises and is active with her grandson. Is inquiring about whether she can come off of her Lopressor.   Past Medical History:  Diagnosis Date  . 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 Bunion   . Contact dermatitis and other eczema, due to unspecified cause   . Contact dermatitis and other eczema, due to unspecified cause   . Disorder of bone and cartilage, unspecified   . Female stress incontinence   . HYPERLIPIDEMIA 10/27/2010  . Hyperpotassemia   . HYPOTHYROIDISM 07/18/2007  . Nonspecific (abnormal) findings on radiological and other examination of other intrathoracic organs   . OSTEOPENIA 03/24/2008  . Palpitations    Echo (1/12) EF 31-51%, mild diastolic dysfunction, atrial septal aneurysm. Holter (1/12): short runs of atrial tachycardia versus MAT. ;  Echo (14) EF 55-60%, normal wall motion, mild MR, mild LAE, atrial septal aneurysm  . Personal history of colonic and rectal adenomas 03/22/2013  . POSTMENOPAUSAL STATUS 02/07/2008  . Symptomatic menopausal or female climacteric states     Past Surgical History:  Procedure Laterality Date  . ABDOMINAL HYSTERECTOMY  1988   prolapse uterus  . CARPAL TUNNEL RELEASE  11/15/2011   Procedure: CARPAL TUNNEL RELEASE;  Surgeon: Tennis Must, MD;  Location: [place];  Service: Orthopedics;  Laterality: Left;  . CATARACT EXTRACTION  2011  . COLONOSCOPY  [id number]   normal,  . COLONOSCOPY WITH PROPOFOL N/A 06/24/2017    Procedure: COLONOSCOPY WITH PROPOFOL;  Surgeon: Gatha Mayer, MD;  Location: [facility];  Service: Endoscopy;  Laterality: N/A;  . FOOT SURGERY Right 10/2014  . FRACTURE SURGERY  june 2006   left wrist  . HAND SURGERY  oct 2012 and jan 2013   Dr Dean--[facility]  . HOT HEMOSTASIS N/A 06/24/2017   Procedure: HOT HEMOSTASIS (ARGON PLASMA COAGULATION/BICAP);  Surgeon: Gatha Mayer, MD;  Location: [facility];  Service: Endoscopy;  Laterality: N/A;  . left wrist hardware removal 10/12  10/12  . natural birth  1962, 1972  . PARTIAL PROCTECTOMY BY TEM  06/22/2013   Procedure: PARTIAL PROCTECTOMY BY TEM;  Surgeon: Leighton Ruff, MD;  Location: [facility];  Service: General;;  . RETINAL DETACHMENT SURGERY  2009   left    Family History  Problem Relation Age of Onset  . Diabetes Mother        1st degree relative  . Hypertension Mother   . Stroke Mother   . Heart disease Father   . Breast cancer Other        1st degree relative <50  . Osteoporosis Other        1st degree relative  . Cancer Maternal Aunt   . Cancer Cousin   . Heart disease Cousin   . Alzheimer's disease Cousin   . Colon cancer Neg Hx   . Esophageal cancer Neg Hx   . Stomach cancer Neg Hx   . Rectal cancer Neg Hx     Social History   Socioeconomic  History  . Marital status: Married    Spouse name: Not on file  . Number of children: 2  . Years of education: Not on file  . Highest education level: Not on file  Occupational History  . Not on file  Tobacco Use  . Smoking status: Never Smoker  . Smokeless tobacco: Never Used  Vaping Use  . Vaping Use: Never used  Substance and Sexual Activity  . Alcohol use: No  . Drug use: No  . Sexual activity: Not Currently  Other Topics Concern  . Not on file  Social History Narrative   Married one son one daughter and 3 grandchildren    Likes to travel   No alcohol tobacco or drug use reported   exercise-- not regular    Social Determinants of Health    Financial Resource Strain:   . Difficulty of Paying Living Expenses:   Food Insecurity:   . Worried About Charity fundraiser in the Last Year:   . Arboriculturist in the Last Year:   Transportation Needs:   . Film/video editor (Medical):   Marland Kitchen Lack of Transportation (Non-Medical):   Physical Activity:   . Days of Exercise per Week:   . Minutes of Exercise per Session:   Stress:   . Feeling of Stress :   Social Connections:   . Frequency of Communication with Friends and Family:   . Frequency of Social Gatherings with Friends and Family:   . Attends Religious Services:   . Active Member of Clubs or Organizations:   . Attends Archivist Meetings:   Marland Kitchen Marital Status:   Intimate Partner Violence:   . Fear of Current or Ex-Partner:   . Emotionally Abused:   Marland Kitchen Physically Abused:   . Sexually Abused:     Outpatient Medications Prior to Visit  Medication Sig Dispense Refill  . acetaminophen (TYLENOL) 500 MG tablet Take 500 mg by mouth daily as needed for moderate pain or headache.    Marland Kitchen aspirin 81 MG tablet Take 81 mg by mouth every evening.      Marland Kitchen CALCIUM-MAGNESIUM-VITAMIN D PO Take 1 tablet by mouth daily.    Marland Kitchen desonide (DESOWEN) 0.05 % cream Apply topically 2 (two) times daily. 30 g 0  . ferrous sulfate 325 (65 FE) MG tablet Take 325 mg by mouth daily.    . Flaxseed, Linseed, (FLAX SEED OIL) 1000 MG CAPS Take 1,000 mg by mouth daily.     . Hypromellose (ARTIFICIAL TEARS OP) Apply 1 drop to eye daily as needed (dry eyes).    Marland Kitchen levothyroxine (SYNTHROID) 50 MCG tablet TAKE 1 TABLET BY MOUTH EVERY DAY** PLEASE SCHEDULE APPT WITH NEW PROVIDER** 90 tablet 0  . Multiple Vitamin (MULTIVITAMIN) tablet Take 1 tablet by mouth daily.      Marland Kitchen neomycin-bacitracin-polymyxin (NEOSPORIN) ointment Apply 1 application topically daily as needed for wound care.    . Omega-3 Fatty Acids (FISH OIL) 1000 MG CAPS Take 1 capsule by mouth 2 (two) times daily.     Marland Kitchen omeprazole (PRILOSEC) 20 MG  capsule TAKE ONE CAPSULE BY MOUTH DAILY 90 capsule 0  . pravastatin (PRAVACHOL) 40 MG tablet TAKE 1 TABLET BY MOUTH EVERY DAY**PLEASE SCHEDULE APPT WITH NEW PROVIDER** 90 tablet 0  . metoprolol tartrate (LOPRESSOR) 25 MG tablet TAKE 1/2 TABLET BY MOUTH TWICE DAILY, MAY TAKE AN EXTRA 1/2 TABLET FOR PALPITATIONS AS NEEDED. Need appt with new provided for additional refills (Patient taking differently: TAKE  1/2 TABLET BY MOUTH ONCE DAILY, MAY TAKE AN EXTRA 1/2 TABLET FOR PALPITATIONS AS NEEDED. Need appt with new provided for additional refills) 45 tablet 0  . fluticasone (FLONASE) 50 MCG/ACT nasal spray Place 2 sprays into both nostrils daily. (Patient not taking: Reported on 09/05/2019) 16 g 1   No facility-administered medications prior to visit.    Allergies  Allergen Reactions  . Sulfonamide Derivatives Rash    ROS Review of Systems    Objective:    Physical Exam  BP 98/60 (BP Location: Right Arm, Patient Position: Sitting, Cuff Size: Normal)   Pulse 62   Temp (!) 97 F (36.1 C) (Temporal)   Ht 5' 3.25" (1.607 m)   Wt 140 lb 9.6 oz (63.8 kg)   SpO2 95%   BMI 24.71 kg/m  Wt Readings from Last 3 Encounters:  04/15/20 140 lb 9.6 oz (63.8 kg)  09/05/19 140 lb 9.6 oz (63.8 kg)  12/28/18 144 lb 3.2 oz (65.4 kg)     Health Maintenance Due  Topic Date Due  . COVID-19 Vaccine (1) Never done  . TETANUS/TDAP  02/06/2018    There are no preventive care reminders to display for this patient.  Lab Results  Component Value Date   TSH 3.68 09/05/2019   Lab Results  Component Value Date   WBC 5.3 09/05/2019   HGB 12.4 09/05/2019   HCT 36.6 09/05/2019   MCV 94.3 09/05/2019   PLT 260.0 09/05/2019   Lab Results  Component Value Date   NA 141 09/05/2019   K 3.8 09/05/2019   CO2 33 (H) 09/05/2019   GLUCOSE 78 09/05/2019   BUN 16 09/05/2019   CREATININE 0.81 09/05/2019   BILITOT 0.5 09/05/2019   ALKPHOS 75 09/05/2019   AST 22 09/05/2019   ALT 17 09/05/2019   PROT 6.9  09/05/2019   ALBUMIN 4.4 09/05/2019   CALCIUM 9.5 09/05/2019   GFR 68.89 09/05/2019   Lab Results  Component Value Date   CHOL 159 09/05/2019   Lab Results  Component Value Date   HDL 64.50 09/05/2019   Lab Results  Component Value Date   LDLCALC 75 09/05/2019   Lab Results  Component Value Date   TRIG 98.0 09/05/2019   Lab Results  Component Value Date   CHOLHDL 2 09/05/2019   No results found for: HGBA1C    Assessment & Plan:   Problem List Items Addressed This Visit    Hypothyroidism - Primary   Relevant Medications   metoprolol tartrate (LOPRESSOR) 25 MG tablet    Other Visit Diagnoses    Essential hypertension       Relevant Medications   metoprolol tartrate (LOPRESSOR) 25 MG tablet   Hypercholesterolemia       Relevant Medications   metoprolol tartrate (LOPRESSOR) 25 MG tablet      Meds ordered this encounter  Medications  . metoprolol tartrate (LOPRESSOR) 25 MG tablet    Sig: TAKE 1/2 TABLET BY MOUTH TWICE DAILY, MAY TAKE AN EXTRA 1/2 TABLET FOR PALPITATIONS AS NEEDED. Need appt with new provided for additional refills    Dispense:  45 tablet    Refill:  3   Mally was seen today for medication refill.  Diagnoses and all orders for this visit:  Other specified hypothyroidism  Essential hypertension -     metoprolol tartrate (LOPRESSOR) 25 MG tablet; TAKE 1/2 TABLET BY MOUTH TWICE DAILY, MAY TAKE AN EXTRA 1/2 TABLET FOR PALPITATIONS AS NEEDED. Need appt with new provided for  additional refills  Hypercholesterolemia   Follow-up: As scheduled At this time I am not recommending that she d/c the Lopressor. Her blood pressure is stable but she has a history of palpitations. I will defer to her PCP for management.  Renewed Lopressor today. All other medication have refills currently. Deferred labs until next office visit so that she can have them drawn fasting. She will fast 6 hours prior to arrival.    Kennyth Arnold, FNP

## 2020-04-15 NOTE — Patient Instructions (Signed)

## 2020-04-30 ENCOUNTER — Other Ambulatory Visit: Payer: Self-pay

## 2020-05-01 ENCOUNTER — Ambulatory Visit: Payer: Medicare PPO | Admitting: Family Medicine

## 2020-05-01 ENCOUNTER — Encounter: Payer: Self-pay | Admitting: Family Medicine

## 2020-05-01 ENCOUNTER — Telehealth: Payer: Self-pay | Admitting: Family Medicine

## 2020-05-01 VITALS — BP 118/70 | HR 58 | Temp 97.5°F | Ht 63.25 in | Wt 139.0 lb

## 2020-05-01 DIAGNOSIS — E785 Hyperlipidemia, unspecified: Secondary | ICD-10-CM | POA: Diagnosis not present

## 2020-05-01 DIAGNOSIS — E039 Hypothyroidism, unspecified: Secondary | ICD-10-CM

## 2020-05-01 DIAGNOSIS — R040 Epistaxis: Secondary | ICD-10-CM | POA: Diagnosis not present

## 2020-05-01 NOTE — Progress Notes (Signed)
Nancy Cuevas is a 74 y.o. female  Chief Complaint  Patient presents with  . Establish Care    Pt here for a TOC.  Pt would like to discuss medication, Metoprolol.  Pt did have 1 egg and a slice of tomato, if any labwork needed.   Pt would also like to discuss occasional nose bleed when she blows her nose x 3week.    HPI: Nancy Cuevas is a 74 y.o. female seen today for [facility], previous PCP Dr. Deborra Medina.  Pt complains of having a nose bleed a few times over the past 3 wks after blowing her nose. These stop easily on their own. No pain.  Pt started with palpitations 5+ years ago, saw cardio, Rx'd metoprolol. She was on 25mg  tabs 1/2 tab BID and then decreased to 1/2 tab daily. She would like to stop this and see if sh truly needs.   Past Medical History:  Diagnosis Date  . 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 Bunion   . Contact dermatitis and other eczema, due to unspecified cause   . Contact dermatitis and other eczema, due to unspecified cause   . Disorder of bone and cartilage, unspecified   . Female stress incontinence   . HYPERLIPIDEMIA 10/27/2010  . Hyperpotassemia   . HYPOTHYROIDISM 07/18/2007  . Nonspecific (abnormal) findings on radiological and other examination of other intrathoracic organs   . OSTEOPENIA 03/24/2008  . Palpitations    Echo (1/12) EF 23-53%, mild diastolic dysfunction, atrial septal aneurysm. Holter (1/12): short runs of atrial tachycardia versus MAT. ;  Echo (14) EF 55-60%, normal wall motion, mild MR, mild LAE, atrial septal aneurysm  . Personal history of colonic and rectal adenomas 03/22/2013  . POSTMENOPAUSAL STATUS 02/07/2008  . Symptomatic menopausal or female climacteric states     Past Surgical History:  Procedure Laterality Date  . ABDOMINAL HYSTERECTOMY  1988   prolapse uterus  . CARPAL TUNNEL RELEASE  11/15/2011   Procedure: CARPAL TUNNEL RELEASE;  Surgeon: Tennis Must, MD;  Location: [place];   Service: Orthopedics;  Laterality: Left;  . CATARACT EXTRACTION  2011  . COLONOSCOPY  [id number]   normal,  . COLONOSCOPY WITH PROPOFOL N/A 06/24/2017   Procedure: COLONOSCOPY WITH PROPOFOL;  Surgeon: Gatha Mayer, MD;  Location: [facility];  Service: Endoscopy;  Laterality: N/A;  . FOOT SURGERY Right 10/2014  . FRACTURE SURGERY  june 2006   left wrist  . HAND SURGERY  oct 2012 and jan 2013   Dr Dean--[facility]  . HOT HEMOSTASIS N/A 06/24/2017   Procedure: HOT HEMOSTASIS (ARGON PLASMA COAGULATION/BICAP);  Surgeon: Gatha Mayer, MD;  Location: [facility];  Service: Endoscopy;  Laterality: N/A;  . left wrist hardware removal 10/12  10/12  . natural birth  1962, 1972  . PARTIAL PROCTECTOMY BY TEM  06/22/2013   Procedure: PARTIAL PROCTECTOMY BY TEM;  Surgeon: Leighton Ruff, MD;  Location: [facility];  Service: General;;  . RETINAL DETACHMENT SURGERY  2009   left    Social History   Socioeconomic History  . Marital status: Married    Spouse name: Not on file  . Number of children: 2  . Years of education: Not on file  . Highest education level: Not on file  Occupational History  . Not on file  Tobacco Use  . Smoking status: Never Smoker  . Smokeless tobacco: Never Used  Vaping Use  . Vaping Use: Never  used  Substance and Sexual Activity  . Alcohol use: No  . Drug use: No  . Sexual activity: Not Currently  Other Topics Concern  . Not on file  Social History Narrative   Married one son one daughter and 3 grandchildren    Likes to travel   No alcohol tobacco or drug use reported   exercise-- not regular    Social Determinants of Health   Financial Resource Strain:   . Difficulty of Paying Living Expenses:   Food Insecurity:   . Worried About Charity fundraiser in the Last Year:   . Arboriculturist in the Last Year:   Transportation Needs:   . Film/video editor (Medical):   Marland Kitchen Lack of Transportation (Non-Medical):   Physical Activity:   . Days of Exercise  per Week:   . Minutes of Exercise per Session:   Stress:   . Feeling of Stress :   Social Connections:   . Frequency of Communication with Friends and Family:   . Frequency of Social Gatherings with Friends and Family:   . Attends Religious Services:   . Active Member of Clubs or Organizations:   . Attends Archivist Meetings:   Marland Kitchen Marital Status:   Intimate Partner Violence:   . Fear of Current or Ex-Partner:   . Emotionally Abused:   Marland Kitchen Physically Abused:   . Sexually Abused:     Family History  Problem Relation Age of Onset  . Diabetes Mother        1st degree relative  . Hypertension Mother   . Stroke Mother   . Heart disease Father   . Breast cancer Other        1st degree relative <50  . Osteoporosis Other        1st degree relative  . Cancer Maternal Aunt   . Cancer Cousin   . Heart disease Cousin   . Alzheimer's disease Cousin   . Colon cancer Neg Hx   . Esophageal cancer Neg Hx   . Stomach cancer Neg Hx   . Rectal cancer Neg Hx      Immunization History  Administered Date(s) Administered  . Fluad Quad(high Dose 65+) 06/22/2019  . Influenza Split 09/17/2011  . Influenza Whole 08/30/2007, 07/31/2008, 09/02/2010, 09/01/2012  . Influenza, High Dose Seasonal PF 07/11/2013, 07/31/2014, 07/22/2015, 07/19/2016, 07/09/2017  . Pneumococcal Conjugate-13 07/31/2014  . Pneumococcal Polysaccharide-23 09/21/2010, 07/19/2016  . Td 02/07/2008  . Zoster 02/07/2008  . Zoster Recombinat (Shingrix) 08/06/2019    Outpatient Encounter Medications as of 05/01/2020  Medication Sig  . acetaminophen (TYLENOL) 500 MG tablet Take 500 mg by mouth daily as needed for moderate pain or headache.  Marland Kitchen aspirin 81 MG tablet Take 81 mg by mouth every evening.    Marland Kitchen CALCIUM-MAGNESIUM-VITAMIN D PO Take 1 tablet by mouth daily.  Marland Kitchen desonide (DESOWEN) 0.05 % cream Apply topically 2 (two) times daily.  . ferrous sulfate 325 (65 FE) MG tablet Take 325 mg by mouth daily.  . Flaxseed,  Linseed, (FLAX SEED OIL) 1000 MG CAPS Take 1,000 mg by mouth daily.   . fluticasone (FLONASE) 50 MCG/ACT nasal spray Place 2 sprays into both nostrils daily.  . Hypromellose (ARTIFICIAL TEARS OP) Apply 1 drop to eye daily as needed (dry eyes).  Marland Kitchen levothyroxine (SYNTHROID) 50 MCG tablet TAKE 1 TABLET BY MOUTH EVERY DAY** PLEASE SCHEDULE APPT WITH NEW PROVIDER**  . metoprolol tartrate (LOPRESSOR) 25 MG tablet TAKE 1/2 TABLET BY  MOUTH TWICE DAILY, MAY TAKE AN EXTRA 1/2 TABLET FOR PALPITATIONS AS NEEDED. Need appt with new provided for additional refills  . Multiple Vitamin (MULTIVITAMIN) tablet Take 1 tablet by mouth daily.    Marland Kitchen neomycin-bacitracin-polymyxin (NEOSPORIN) ointment Apply 1 application topically daily as needed for wound care.  . Omega-3 Fatty Acids (FISH OIL) 1000 MG CAPS Take 1 capsule by mouth 2 (two) times daily.   Marland Kitchen omeprazole (PRILOSEC) 20 MG capsule TAKE ONE CAPSULE BY MOUTH DAILY  . pravastatin (PRAVACHOL) 40 MG tablet TAKE 1 TABLET BY MOUTH EVERY DAY**PLEASE SCHEDULE APPT WITH NEW PROVIDER**   No facility-administered encounter medications on file as of 05/01/2020.     ROS: Pertinent positives and negatives noted in HPI. Remainder of ROS non-contributory   Allergies  Allergen Reactions  . Sulfonamide Derivatives Rash    BP (!) 118/40 (BP Location: Left Arm, Patient Position: Sitting, Cuff Size: Normal)   Pulse (!) 58   Temp (!) 97.5 F (36.4 C) (Temporal)   Ht 5' 3.25" (1.607 m)   Wt 139 lb (63 kg)   SpO2 97%   BMI 24.43 kg/m   BP Readings from Last 3 Encounters:  05/01/20 (!) 118/40  04/15/20 98/60  09/05/19 138/70   Pulse Readings from Last 3 Encounters:  05/01/20 (!) 58  04/15/20 62  09/05/19 (!) 55   Wt Readings from Last 3 Encounters:  05/01/20 139 lb (63 kg)  04/15/20 140 lb 9.6 oz (63.8 kg)  09/05/19 140 lb 9.6 oz (63.8 kg)    Physical Exam Constitutional:      Appearance: Normal appearance.  Cardiovascular:     Rate and Rhythm: Normal  rate and regular rhythm.  Pulmonary:     Effort: Pulmonary effort is normal. No respiratory distress.  Skin:    General: Skin is warm and dry.  Neurological:     Mental Status: She is alert and oriented to person, place, and time.  Psychiatric:        Mood and Affect: Mood normal.        Behavior: Behavior normal.      A/P:  1. Mild epistaxis - nasal saline spray 2x/day   2. Hyperlipidemia, unspecified hyperlipidemia type - on pravastatin - ALT - AST - Basic metabolic panel - Lipid panel  3. Hypothyroidism, unspecified type - cont with levothyroxine 85mcg daily - T4, free - TSH   This visit occurred during the SARS-CoV-2 public health emergency.  Safety protocols were in place, including screening questions prior to the visit, additional usage of staff PPE, and extensive cleaning of exam room while observing appropriate contact time as indicated for disinfecting solutions.

## 2020-05-01 NOTE — Patient Instructions (Signed)
For your nose bleeds: Starting using nasal saline spray twice daily to keep inside of nose moist Blow nose gently

## 2020-05-01 NOTE — Telephone Encounter (Signed)
Patient called and stated that her blood pressure was entered incorrectly on the after visit summary. Patient stated that her blood pressure was 118/70 when it was taken in the office and wanted to see  if this can be corrected in the chart, please advise. CB is [phone]

## 2020-05-02 LAB — BASIC METABOLIC PANEL
BUN: 16 mg/dL (ref 6–23)
CO2: 30 mEq/L (ref 19–32)
Calcium: 9.9 mg/dL (ref 8.4–10.5)
Chloride: 103 mEq/L (ref 96–112)
Creatinine, Ser: 0.73 mg/dL (ref 0.40–1.20)
GFR: 77.53 mL/min (ref >60.00)
Glucose, Bld: 90 mg/dL (ref 70–99)
Potassium: 4.1 mEq/L (ref 3.5–5.1)
Sodium: 141 mEq/L (ref 135–145)

## 2020-05-02 LAB — LIPID PANEL
Cholesterol: 177 mg/dL (ref 0–200)
HDL: 74.1 mg/dL (ref >39.00)
LDL Cholesterol: 90 mg/dL (ref 0–99)
NonHDL: 103.19
Total CHOL/HDL Ratio: 2
Triglycerides: 68 mg/dL (ref 0.0–149.0)
VLDL: 13.6 mg/dL (ref 0.0–40.0)

## 2020-05-02 LAB — AST: AST: 24 U/L (ref 0–37)

## 2020-05-02 LAB — ALT: ALT: 18 U/L (ref 0–35)

## 2020-05-02 LAB — T4, FREE: Free T4: 1.1 ng/dL (ref 0.60–1.60)

## 2020-05-02 LAB — TSH: TSH: 1.57 u[IU]/mL (ref 0.35–4.50)

## 2020-06-16 ENCOUNTER — Telehealth: Payer: Self-pay | Admitting: Family Medicine

## 2020-06-16 NOTE — Telephone Encounter (Signed)
Left message for patient to schedule Annual Wellness Visit.  Please schedule with Nurse Health Advisor Martha Stanley, RN at [place]  °

## 2020-07-05 ENCOUNTER — Other Ambulatory Visit: Payer: Self-pay | Admitting: Family Medicine

## 2020-07-05 DIAGNOSIS — R1310 Dysphagia, unspecified: Secondary | ICD-10-CM

## 2020-07-05 DIAGNOSIS — E039 Hypothyroidism, unspecified: Secondary | ICD-10-CM

## 2020-07-09 ENCOUNTER — Telehealth: Payer: Self-pay | Admitting: Family Medicine

## 2020-07-09 NOTE — Telephone Encounter (Signed)
Left message for patient to schedule Annual Wellness Visit.  Please schedule with Nurse Health Advisor Martha Stanley, RN at [place]  °

## 2020-07-10 ENCOUNTER — Other Ambulatory Visit: Payer: Self-pay | Admitting: Family Medicine

## 2020-07-10 DIAGNOSIS — H01005 Unspecified blepharitis left lower eyelid: Secondary | ICD-10-CM | POA: Diagnosis not present

## 2020-07-10 DIAGNOSIS — E785 Hyperlipidemia, unspecified: Secondary | ICD-10-CM

## 2020-09-01 ENCOUNTER — Other Ambulatory Visit (HOSPITAL_BASED_OUTPATIENT_CLINIC_OR_DEPARTMENT_OTHER): Payer: Self-pay | Admitting: Internal Medicine

## 2020-09-01 ENCOUNTER — Ambulatory Visit: Payer: Medicare PPO | Attending: Internal Medicine

## 2020-09-01 DIAGNOSIS — Z23 Encounter for immunization: Secondary | ICD-10-CM

## 2020-09-01 MED FILL — PFIZER-BIONTECH COVID-19 VA: 30 | 1 days supply | Qty: 0 | Fill #0

## 2020-09-01 NOTE — Progress Notes (Signed)
° °  Covid-19 Vaccination Clinic  Name:  Nancy Cuevas    MRN: [id number] DOB: December 26, 1945  09/01/2020  [name] was observed post Covid-19 immunization for 15 minutes without incident. She was provided with Vaccine Information Sheet and instruction to access the V-Safe system.   [name] was instructed to call 911 with any severe reactions post vaccine:  Difficulty breathing   Swelling of face and throat   A fast heartbeat   A bad rash all over body   Dizziness and weakness   Immunizations Administered    Name Date Dose VIS Date Route   Pfizer COVID-19 Vaccine 09/01/2020 12:44 PM 0.3 mL 08/06/2020 Intramuscular   Manufacturer: [employer]   Lot: [id number]   NDC: [address]-8

## 2020-10-03 ENCOUNTER — Other Ambulatory Visit: Payer: Self-pay | Admitting: Family Medicine

## 2020-10-03 DIAGNOSIS — R1310 Dysphagia, unspecified: Secondary | ICD-10-CM

## 2020-10-03 DIAGNOSIS — E039 Hypothyroidism, unspecified: Secondary | ICD-10-CM

## 2020-10-03 DIAGNOSIS — E785 Hyperlipidemia, unspecified: Secondary | ICD-10-CM

## 2020-10-03 NOTE — Telephone Encounter (Signed)
Last OV 05/01/20 Last fill for all medications 07/07/20  #90/0

## 2020-10-06 NOTE — Progress Notes (Signed)
Subjective:   Nancy Cuevas is a 74 y.o. female who presents for Medicare Annual (Subsequent) preventive examination.  I connected with Cheron today by telephone and verified that I am speaking with the correct person using two identifiers. Location patient: home Location provider: work Persons participating in the virtual visit: patient, Marine scientist.    I discussed the limitations, risks, security and privacy concerns of performing an evaluation and management service by telephone and the availability of in person appointments. I also discussed with the patient that there may be a patient responsible charge related to this service. The patient expressed understanding and verbally consented to this telephonic visit.    Interactive audio and video telecommunications were attempted between this provider and patient, however failed, due to patient having technical difficulties OR patient did not have access to video capability.  We continued and completed visit with audio only.  Some vital signs may be absent or patient reported.   Time Spent with patient on telephone encounter: 25 minutes    Review of Systems     Cardiac Risk Factors include: advanced age (>28men, >63 women);dyslipidemia     Objective:    Today's Vitals   10/07/20 1033  Weight: 139 lb (63 kg)  Height: 5' 3.25" (1.607 m)   Body mass index is 24.43 kg/m.  Advanced Directives 10/07/2020 06/05/2018 06/24/2017 06/10/2017 05/18/2017 03/17/2017 06/15/2013  Does Patient Have a Medical Advance Directive? Yes Yes Yes Yes Yes Yes Patient has advance directive, copy not in chart  Type of Advance Directive [place];Living will [place];Living will [place];Living will Living will;Healthcare Power of Attorney Living will;Healthcare Power of [place];Living will [place];Living will  Does patient want to make changes to  medical advance directive? - - - No - Patient declined - - -  Copy of [place] in Chart? Yes - validated most recent copy scanned in chart (See row information) Yes No - copy requested No - copy requested - No - copy requested Copy requested from family  Pre-existing out of facility DNR order (yellow form or pink MOST form) - - - - - - No    Current Medications (verified) Outpatient Encounter Medications as of 10/07/2020  Medication Sig  . acetaminophen (TYLENOL) 500 MG tablet Take 500 mg by mouth daily as needed for moderate pain or headache.  Marland Kitchen aspirin 81 MG tablet Take 81 mg by mouth every evening.  Marland Kitchen CALCIUM-MAGNESIUM-VITAMIN D PO Take 1 tablet by mouth daily.  Marland Kitchen desonide (DESOWEN) 0.05 % cream Apply topically 2 (two) times daily.  . ferrous sulfate 325 (65 FE) MG tablet Take 325 mg by mouth daily.  . Flaxseed, Linseed, (FLAX SEED OIL) 1000 MG CAPS Take 1,000 mg by mouth daily.   . fluticasone (FLONASE) 50 MCG/ACT nasal spray Place 2 sprays into both nostrils daily.  . Hypromellose (ARTIFICIAL TEARS OP) Apply 1 drop to eye daily as needed (dry eyes).  Marland Kitchen levothyroxine (SYNTHROID) 50 MCG tablet TAKE 1 TABLET BY MOUTH EVERY DAY** PLEASE SCHEDULE APPT WITH NEW PROVIDER**  . metoprolol tartrate (LOPRESSOR) 25 MG tablet TAKE 1/2 TABLET BY MOUTH TWICE DAILY, MAY TAKE AN EXTRA 1/2 TABLET FOR PALPITATIONS AS NEEDED. Need appt with new provided for additional refills  . Multiple Vitamin (MULTIVITAMIN) tablet Take 1 tablet by mouth daily.  Marland Kitchen neomycin-bacitracin-polymyxin (NEOSPORIN) ointment Apply 1 application topically daily as needed for wound care.  . Omega-3 Fatty  Acids (FISH OIL) 1000 MG CAPS Take 1 capsule by mouth 2 (two) times daily.   Marland Kitchen omeprazole (PRILOSEC) 20 MG capsule TAKE ONE CAPSULE BY MOUTH DAILY  . pravastatin (PRAVACHOL) 40 MG tablet TAKE 1 TABLET BY MOUTH EVERY DAY   No facility-administered encounter medications on file as of 10/07/2020.    Allergies  (verified) Sulfonamide derivatives   History: Past Medical History:  Diagnosis Date  . 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 Bunion   . Contact dermatitis and other eczema, due to unspecified cause   . Contact dermatitis and other eczema, due to unspecified cause   . Disorder of bone and cartilage, unspecified   . Female stress incontinence   . HYPERLIPIDEMIA 10/27/2010  . Hyperpotassemia   . HYPOTHYROIDISM 07/18/2007  . Nonspecific (abnormal) findings on radiological and other examination of other intrathoracic organs   . OSTEOPENIA 03/24/2008  . Palpitations    Echo (1/12) EF 50-35%, mild diastolic dysfunction, atrial septal aneurysm. Holter (1/12): short runs of atrial tachycardia versus MAT. ;  Echo (14) EF 55-60%, normal wall motion, mild MR, mild LAE, atrial septal aneurysm  . Personal history of colonic and rectal adenomas 03/22/2013  . POSTMENOPAUSAL STATUS 02/07/2008  . Symptomatic menopausal or female climacteric states    Past Surgical History:  Procedure Laterality Date  . ABDOMINAL HYSTERECTOMY  1988   prolapse uterus  . CARPAL TUNNEL RELEASE  11/15/2011   Procedure: CARPAL TUNNEL RELEASE;  Surgeon: Tennis Must, MD;  Location: [place];  Service: Orthopedics;  Laterality: Left;  . CATARACT EXTRACTION  2011  . COLONOSCOPY  [id number]   normal,  . COLONOSCOPY WITH PROPOFOL N/A 06/24/2017   Procedure: COLONOSCOPY WITH PROPOFOL;  Surgeon: Gatha Mayer, MD;  Location: [facility];  Service: Endoscopy;  Laterality: N/A;  . FOOT SURGERY Right 10/2014  . FRACTURE SURGERY  june 2006   left wrist  . HAND SURGERY  oct 2012 and jan 2013   Dr Dean--[facility]  . HOT HEMOSTASIS N/A 06/24/2017   Procedure: HOT HEMOSTASIS (ARGON PLASMA COAGULATION/BICAP);  Surgeon: Gatha Mayer, MD;  Location: [facility];  Service: Endoscopy;  Laterality: N/A;  . left wrist hardware removal 10/12  10/12  . natural birth  1962, 1972  . PARTIAL PROCTECTOMY BY  TEM  06/22/2013   Procedure: PARTIAL PROCTECTOMY BY TEM;  Surgeon: Leighton Ruff, MD;  Location: [facility];  Service: General;;  . RETINAL DETACHMENT SURGERY  2009   left   Family History  Problem Relation Age of Onset  . Diabetes Mother        1st degree relative  . Hypertension Mother   . Stroke Mother   . Heart disease Father   . Breast cancer Other        1st degree relative <50  . Osteoporosis Other        1st degree relative  . Cancer Maternal Aunt   . Cancer Cousin   . Heart disease Cousin   . Alzheimer's disease Cousin   . Colon cancer Neg Hx   . Esophageal cancer Neg Hx   . Stomach cancer Neg Hx   . Rectal cancer Neg Hx    Social History   Socioeconomic History  . Marital status: Married    Spouse name: Not on file  . Number of children: 2  . Years of education: Not on file  . Highest education level: Not on file  Occupational History  . Not on  file  Tobacco Use  . Smoking status: Never Smoker  . Smokeless tobacco: Never Used  Vaping Use  . Vaping Use: Never used  Substance and Sexual Activity  . Alcohol use: No  . Drug use: No  . Sexual activity: Not Currently  Other Topics Concern  . Not on file  Social History Narrative   Married one son one daughter and 3 grandchildren    Likes to travel   No alcohol tobacco or drug use reported   exercise-- not regular    Social Determinants of Health   Financial Resource Strain: Low Risk   . Difficulty of Paying Living Expenses: Not hard at all  Food Insecurity: No Food Insecurity  . Worried About Charity fundraiser in the Last Year: Never true  . Ran Out of Food in the Last Year: Never true  Transportation Needs: No Transportation Needs  . Lack of Transportation (Medical): No  . Lack of Transportation (Non-Medical): No  Physical Activity: Sufficiently Active  . Days of Exercise per Week: 5 days  . Minutes of Exercise per Session: 30 min  Stress: No Stress Concern Present  . Feeling of Stress : Not at all   Social Connections: Socially Integrated  . Frequency of Communication with Friends and Family: More than three times a week  . Frequency of Social Gatherings with Friends and Family: More than three times a week  . Attends Religious Services: 1 to 4 times per year  . Active Member of Clubs or Organizations: Yes  . Attends Archivist Meetings: More than 4 times per year  . Marital Status: Married    Tobacco Counseling Counseling given: Not Answered   Clinical Intake:  Pre-visit preparation completed: Yes  Pain : No/denies pain     Nutritional Status: BMI of 19-24  Normal Nutritional Risks: None Diabetes: No  How often do you need to have someone help you when you read instructions, pamphlets, or other written materials from your doctor or pharmacy?: 1 - Never What is the last grade level you completed in school?: Master's Degree  Diabetic?No  Interpreter Needed?: No  Information entered by :: Caroleen Hamman LPN   Activities of Daily Living In your present state of health, do you have any difficulty performing the following activities: 10/07/2020  Hearing? N  Vision? N  Difficulty concentrating or making decisions? N  Walking or climbing stairs? N  Dressing or bathing? N  Doing errands, shopping? N  Preparing Food and eating ? N  Using the Toilet? N  In the past six months, have you accidently leaked urine? N  Do you have problems with loss of bowel control? N  Managing your Medications? N  Managing your Finances? N  Housekeeping or managing your Housekeeping? N  Some recent data might be hidden    Patient Care Team: Ronnald Nian, DO as PCP - General (Family Medicine) Gatha Mayer, MD as Consulting Physician (Gastroenterology) Marygrace Drought, MD as Consulting Physician (Ophthalmology) Larey Dresser, MD as Consulting Physician (Cardiology) Ladell Pier, DDS as Consulting Physician (Dentistry)  Indicate any recent Medical Services you  may have received from other than [facility] providers in the past year (date may be approximate).     Assessment:   This is a routine wellness examination for [facility]  Hearing/Vision screen  Hearing Screening   125Hz  250Hz  500Hz  1000Hz  2000Hz  3000Hz  4000Hz  6000Hz  8000Hz   Right ear:           Left ear:  Comments: No issues  Vision Screening Comments: Reading glasses Last eye exam-05/2020-Dr. Tanner  Dietary issues and exercise activities discussed: Current Exercise Habits: Home exercise routine, Type of exercise: walking;stretching, Time (Minutes): 30, Frequency (Times/Week): 5, Weekly Exercise (Minutes/Week): 150, Intensity: Mild, Exercise limited by: None identified  Goals    . Increase physical activity     Continue current exercise regimen and start going to the [facility] 3x weekly.      Depression Screen PHQ 2/9 Scores 10/07/2020 09/05/2019 06/05/2018 03/17/2017 05/25/2016 10/24/2015 02/21/2015  PHQ - 2 Score 0 0 0 0 0 0 0    Fall Risk Fall Risk  10/07/2020 06/05/2018 03/17/2017 05/25/2016 10/24/2015  Falls in the past year? 0 No No No No  Number falls in past yr: 0 - - - -  Injury with Fall? 0 - - - -  Follow up Falls prevention discussed - - - -    FALL RISK PREVENTION PERTAINING TO THE HOME:  Any stairs in or around the home? Yes  If so, are there any without handrails? No  Home free of loose throw rugs in walkways, pet beds, electrical cords, etc? Yes  Adequate lighting in your home to reduce risk of falls? Yes   ASSISTIVE DEVICES UTILIZED TO PREVENT FALLS:  Life alert? No  Use of a cane, walker or w/c? No  Grab bars in the bathroom? No  Shower chair or bench in shower? Yes  Elevated toilet seat or a handicapped toilet? No   TIMED UP AND GO:  Was the test performed? No . Phone visit   Cognitive Function:Normal cognitive status assessed by this Nurse Health Advisor. No abnormalities found.   MMSE - Mini Mental State Exam 03/17/2017  Orientation to time 5  Orientation  to Place 5  Registration 3  Attention/ Calculation 5  Recall 3  Language- name 2 objects 2  Language- repeat 1  Language- follow 3 step command 3  Language- read & follow direction 1  Write a sentence 1  Copy design 1  Total score 30        Immunizations Immunization History  Administered Date(s) Administered  . Fluad Quad(high Dose 65+) 06/22/2019  . Influenza Split 09/17/2011  . Influenza Whole 08/30/2007, 07/31/2008, 09/02/2010, 09/01/2012  . Influenza, High Dose Seasonal PF 07/11/2013, 07/31/2014, 07/22/2015, 07/19/2016, 07/09/2017  . Influenza-Unspecified 06/18/2020  . PFIZER SARS-COV-2 Vaccination 11/20/2019, 12/11/2019, 09/01/2020  . Pneumococcal Conjugate-13 07/31/2014  . Pneumococcal Polysaccharide-23 09/21/2010, 07/19/2016  . Td 02/07/2008  . Zoster 02/07/2008  . Zoster Recombinat (Shingrix) 08/06/2019    TDAP status: Due, Education has been provided regarding the importance of this vaccine. Advised may receive this vaccine at local pharmacy or Health Dept. Aware to provide a copy of the vaccination record if obtained from local pharmacy or Health Dept. Verbalized acceptance and understanding.  Flu Vaccine status: Up to date  Pneumococcal vaccine status: Up to date  Covid-19 vaccine status: Completed vaccines  Qualifies for Shingles Vaccine? No   Zostavax completed Yes   Shingrix Completed?: Yes  Screening Tests Health Maintenance  Topic Date Due  . TETANUS/TDAP  02/06/2018  . COLONOSCOPY  06/24/2020  . MAMMOGRAM  12/03/2020  . INFLUENZA VACCINE  Completed  . DEXA SCAN  Completed  . COVID-19 Vaccine  Completed  . Hepatitis C Screening  Completed  . PNA vac Low Risk Adult  Completed    Health Maintenance  Health Maintenance Due  Topic Date Due  . TETANUS/TDAP  02/06/2018  . COLONOSCOPY  06/24/2020  Colorectal cancer screening: No longer required.   Mammogram status: Completed Bilateral 12/03/2019. Repeat every year  Bone Density status:  Completed 12/04/2019. Results reflect: Bone density results: OSTEOPENIA. Repeat every 2 years.  Lung Cancer Screening: (Low Dose CT Chest recommended if Age 42-80 years, 30 pack-year currently smoking OR have quit w/in 15years.) does not qualify.     Additional Screening:  Hepatitis C Screening:  Completed 10/24/2015  Vision Screening: Recommended annual ophthalmology exams for early detection of glaucoma and other disorders of the eye. Is the patient up to date with their annual eye exam?  Yes  Who is the provider or what is the name of the office in which the patient attends annual eye exams? Dr. Satira Sark   Dental Screening: Recommended annual dental exams for proper oral hygiene  Community Resource Referral / Chronic Care Management: CRR required this visit?  No   CCM required this visit?  No      Plan:     I have personally reviewed and noted the following in the patient's chart:   . Medical and social history . Use of alcohol, tobacco or illicit drugs  . Current medications and supplements . Functional ability and status . Nutritional status . Physical activity . Advanced directives . List of other physicians . Hospitalizations, surgeries, and ER visits in previous 12 months . Vitals . Screenings to include cognitive, depression, and falls . Referrals and appointments  In addition, I have reviewed and discussed with patient certain preventive protocols, quality metrics, and best practice recommendations. A written personalized care plan for preventive services as well as general preventive health recommendations were provided to patient.   Due to this being a telephonic visit, the after visit summary with patients personalized plan was offered to patient via mail or my-chart.Patient would like to access on my-chart.   Marta Antu, LPN   [id number]  Nurse Health Advisor  Nurse Notes: None

## 2020-10-07 ENCOUNTER — Ambulatory Visit (INDEPENDENT_AMBULATORY_CARE_PROVIDER_SITE_OTHER): Payer: Medicare PPO

## 2020-10-07 VITALS — Ht 63.25 in | Wt 139.0 lb

## 2020-10-07 DIAGNOSIS — Z0000 Encounter for general adult medical examination without abnormal findings: Secondary | ICD-10-CM | POA: Diagnosis not present

## 2020-10-07 NOTE — Patient Instructions (Signed)
Ms. Nancy Cuevas , Thank you for taking time to complete your Medicare Wellness Visit. I appreciate your ongoing commitment to your health goals. Please review the following plan we discussed and let me know if I can assist you in the future.   Screening recommendations/referrals: Colonoscopy: No longer required Mammogram: Completed 12/04/2019-Due 12/03/2020 Bone Density: Completed 12/04/2019-Due 12/03/2021 Recommended yearly ophthalmology/optometry visit for glaucoma screening and checkup Recommended yearly dental visit for hygiene and checkup  Vaccinations: Influenza vaccine: Up to date Pneumococcal vaccine: Completed vaccines Tdap vaccine: Discuss with pharmacy Shingles vaccine: Completed vaccines   Covid-19:Completed vaccines  Advanced directives: Copy in chart  Conditions/risks identified: See problem list  Next appointment: Follow up in one year for your annual wellness visit    Preventive Care 65 Years and Older, Female Preventive care refers to lifestyle choices and visits with your health care provider that can promote health and wellness. What does preventive care include?  A yearly physical exam. This is also called an annual well check.  Dental exams once or twice a year.  Routine eye exams. Ask your health care provider how often you should have your eyes checked.  Personal lifestyle choices, including:  Daily care of your teeth and gums.  Regular physical activity.  Eating a healthy diet.  Avoiding tobacco and drug use.  Limiting alcohol use.  Practicing safe sex.  Taking low-dose aspirin every day.  Taking vitamin and mineral supplements as recommended by your health care provider. What happens during an annual well check? The services and screenings done by your health care provider during your annual well check will depend on your age, overall health, lifestyle risk factors, and family history of disease. Counseling  Your health care provider may ask you  questions about your:  Alcohol use.  Tobacco use.  Drug use.  Emotional well-being.  Home and relationship well-being.  Sexual activity.  Eating habits.  History of falls.  Memory and ability to understand (cognition).  Work and work Statistician.  Reproductive health. Screening  You may have the following tests or measurements:  Height, weight, and BMI.  Blood pressure.  Lipid and cholesterol levels. These may be checked every 5 years, or more frequently if you are over 20 years old.  Skin check.  Lung cancer screening. You may have this screening every year starting at age 63 if you have a 30-pack-year history of smoking and currently smoke or have quit within the past 15 years.  Fecal occult blood test (FOBT) of the stool. You may have this test every year starting at age 91.  Flexible sigmoidoscopy or colonoscopy. You may have a sigmoidoscopy every 5 years or a colonoscopy every 10 years starting at age 58.  Hepatitis C blood test.  Hepatitis B blood test.  Sexually transmitted disease (STD) testing.  Diabetes screening. This is done by checking your blood sugar (glucose) after you have not eaten for a while (fasting). You may have this done every 1-3 years.  Bone density scan. This is done to screen for osteoporosis. You may have this done starting at age 23.  Mammogram. This may be done every 1-2 years. Talk to your health care provider about how often you should have regular mammograms. Talk with your health care provider about your test results, treatment options, and if necessary, the need for more tests. Vaccines  Your health care provider may recommend certain vaccines, such as:  Influenza vaccine. This is recommended every year.  Tetanus, diphtheria, and acellular pertussis (Tdap, Td)  vaccine. You may need a Td booster every 10 years.  Zoster vaccine. You may need this after age 57.  Pneumococcal 13-valent conjugate (PCV13) vaccine. One dose is  recommended after age 61.  Pneumococcal polysaccharide (PPSV23) vaccine. One dose is recommended after age 8. Talk to your health care provider about which screenings and vaccines you need and how often you need them. This information is not intended to replace advice given to you by your health care provider. Make sure you discuss any questions you have with your health care provider. Document Released: 10/31/2015 Document Revised: 06/23/2016 Document Reviewed: 08/05/2015 Elsevier Interactive Patient Education  2017 [place] Prevention in the Home Falls can cause injuries. They can happen to people of all ages. There are many things you can do to make your home safe and to help prevent falls. What can I do on the outside of my home?  Regularly fix the edges of walkways and driveways and fix any cracks.  Remove anything that might make you trip as you walk through a door, such as a raised step or threshold.  Trim any bushes or trees on the path to your home.  Use bright outdoor lighting.  Clear any walking paths of anything that might make someone trip, such as rocks or tools.  Regularly check to see if handrails are loose or broken. Make sure that both sides of any steps have handrails.  Any raised decks and porches should have guardrails on the edges.  Have any leaves, snow, or ice cleared regularly.  Use sand or salt on walking paths during winter.  Clean up any spills in your garage right away. This includes oil or grease spills. What can I do in the bathroom?  Use night lights.  Install grab bars by the toilet and in the tub and shower. Do not use towel bars as grab bars.  Use non-skid mats or decals in the tub or shower.  If you need to sit down in the shower, use a plastic, non-slip stool.  Keep the floor dry. Clean up any water that spills on the floor as soon as it happens.  Remove soap buildup in the tub or shower regularly.  Attach bath mats  securely with double-sided non-slip rug tape.  Do not have throw rugs and other things on the floor that can make you trip. What can I do in the bedroom?  Use night lights.  Make sure that you have a light by your bed that is easy to reach.  Do not use any sheets or blankets that are too big for your bed. They should not hang down onto the floor.  Have a firm chair that has side arms. You can use this for support while you get dressed.  Do not have throw rugs and other things on the floor that can make you trip. What can I do in the kitchen?  Clean up any spills right away.  Avoid walking on wet floors.  Keep items that you use a lot in easy-to-reach places.  If you need to reach something above you, use a strong step stool that has a grab bar.  Keep electrical cords out of the way.  Do not use floor polish or wax that makes floors slippery. If you must use wax, use non-skid floor wax.  Do not have throw rugs and other things on the floor that can make you trip. What can I do with my stairs?  Do not leave  any items on the stairs.  Make sure that there are handrails on both sides of the stairs and use them. Fix handrails that are broken or loose. Make sure that handrails are as long as the stairways.  Check any carpeting to make sure that it is firmly attached to the stairs. Fix any carpet that is loose or worn.  Avoid having throw rugs at the top or bottom of the stairs. If you do have throw rugs, attach them to the floor with carpet tape.  Make sure that you have a light switch at the top of the stairs and the bottom of the stairs. If you do not have them, ask someone to add them for you. What else can I do to help prevent falls?  Wear shoes that:  Do not have high heels.  Have rubber bottoms.  Are comfortable and fit you well.  Are closed at the toe. Do not wear sandals.  If you use a stepladder:  Make sure that it is fully opened. Do not climb a closed  stepladder.  Make sure that both sides of the stepladder are locked into place.  Ask someone to hold it for you, if possible.  Clearly mark and make sure that you can see:  Any grab bars or handrails.  First and last steps.  Where the edge of each step is.  Use tools that help you move around (mobility aids) if they are needed. These include:  Canes.  Walkers.  Scooters.  Crutches.  Turn on the lights when you go into a dark area. Replace any light bulbs as soon as they burn out.  Set up your furniture so you have a clear path. Avoid moving your furniture around.  If any of your floors are uneven, fix them.  If there are any pets around you, be aware of where they are.  Review your medicines with your doctor. Some medicines can make you feel dizzy. This can increase your chance of falling. Ask your doctor what other things that you can do to help prevent falls. This information is not intended to replace advice given to you by your health care provider. Make sure you discuss any questions you have with your health care provider. Document Released: 07/31/2009 Document Revised: 03/11/2016 Document Reviewed: 11/08/2014 Elsevier Interactive Patient Education  2017 [employer].

## 2020-12-31 DIAGNOSIS — M21962 Unspecified acquired deformity of left lower leg: Secondary | ICD-10-CM | POA: Diagnosis not present

## 2020-12-31 DIAGNOSIS — M8580 Other specified disorders of bone density and structure, unspecified site: Secondary | ICD-10-CM | POA: Diagnosis not present

## 2020-12-31 DIAGNOSIS — M205X2 Other deformities of toe(s) (acquired), left foot: Secondary | ICD-10-CM | POA: Diagnosis not present

## 2020-12-31 DIAGNOSIS — M722 Plantar fascial fibromatosis: Secondary | ICD-10-CM | POA: Diagnosis not present

## 2021-01-07 DIAGNOSIS — H35371 Puckering of macula, right eye: Secondary | ICD-10-CM | POA: Diagnosis not present

## 2021-01-07 DIAGNOSIS — H04123 Dry eye syndrome of bilateral lacrimal glands: Secondary | ICD-10-CM | POA: Diagnosis not present

## 2021-01-07 DIAGNOSIS — H2511 Age-related nuclear cataract, right eye: Secondary | ICD-10-CM | POA: Diagnosis not present

## 2021-01-07 DIAGNOSIS — H524 Presbyopia: Secondary | ICD-10-CM | POA: Diagnosis not present

## 2021-01-18 ENCOUNTER — Other Ambulatory Visit: Payer: Self-pay | Admitting: Family Medicine

## 2021-01-18 DIAGNOSIS — E785 Hyperlipidemia, unspecified: Secondary | ICD-10-CM

## 2021-01-18 DIAGNOSIS — R1310 Dysphagia, unspecified: Secondary | ICD-10-CM

## 2021-01-18 DIAGNOSIS — E039 Hypothyroidism, unspecified: Secondary | ICD-10-CM

## 2021-01-19 ENCOUNTER — Other Ambulatory Visit: Payer: Self-pay | Admitting: Family Medicine

## 2021-01-19 DIAGNOSIS — E785 Hyperlipidemia, unspecified: Secondary | ICD-10-CM

## 2021-01-19 DIAGNOSIS — R1310 Dysphagia, unspecified: Secondary | ICD-10-CM

## 2021-01-19 DIAGNOSIS — E039 Hypothyroidism, unspecified: Secondary | ICD-10-CM

## 2021-01-21 DIAGNOSIS — M722 Plantar fascial fibromatosis: Secondary | ICD-10-CM | POA: Diagnosis not present

## 2021-01-21 DIAGNOSIS — M21962 Unspecified acquired deformity of left lower leg: Secondary | ICD-10-CM | POA: Diagnosis not present

## 2021-01-21 DIAGNOSIS — M205X2 Other deformities of toe(s) (acquired), left foot: Secondary | ICD-10-CM | POA: Diagnosis not present

## 2021-02-16 ENCOUNTER — Other Ambulatory Visit: Payer: Self-pay | Admitting: Family Medicine

## 2021-02-16 DIAGNOSIS — E785 Hyperlipidemia, unspecified: Secondary | ICD-10-CM

## 2021-02-16 DIAGNOSIS — R1310 Dysphagia, unspecified: Secondary | ICD-10-CM

## 2021-02-16 DIAGNOSIS — E039 Hypothyroidism, unspecified: Secondary | ICD-10-CM

## 2021-02-16 NOTE — Telephone Encounter (Signed)
Last OV 05/01/20 Last fill for all 3 medications 01/19/21  #30/0

## 2021-02-19 DIAGNOSIS — M205X2 Other deformities of toe(s) (acquired), left foot: Secondary | ICD-10-CM | POA: Diagnosis not present

## 2021-02-19 DIAGNOSIS — M21962 Unspecified acquired deformity of left lower leg: Secondary | ICD-10-CM | POA: Diagnosis not present

## 2021-02-19 DIAGNOSIS — M722 Plantar fascial fibromatosis: Secondary | ICD-10-CM | POA: Diagnosis not present

## 2021-02-25 ENCOUNTER — Other Ambulatory Visit: Payer: Self-pay

## 2021-02-26 ENCOUNTER — Ambulatory Visit: Payer: Medicare PPO | Admitting: Family Medicine

## 2021-02-26 ENCOUNTER — Encounter: Payer: Self-pay | Admitting: Family Medicine

## 2021-02-26 VITALS — BP 122/70 | HR 60 | Temp 97.9°F | Ht 63.25 in | Wt 138.8 lb

## 2021-02-26 DIAGNOSIS — E785 Hyperlipidemia, unspecified: Secondary | ICD-10-CM | POA: Diagnosis not present

## 2021-02-26 DIAGNOSIS — I10 Essential (primary) hypertension: Secondary | ICD-10-CM

## 2021-02-26 DIAGNOSIS — E039 Hypothyroidism, unspecified: Secondary | ICD-10-CM

## 2021-02-26 NOTE — Patient Instructions (Addendum)
Calcium 1200mg  daily  Vit D3 1000IU daily   Bone density 11/2018 - due in 11/2020

## 2021-02-26 NOTE — Progress Notes (Signed)
Nancy Cuevas is a 75 y.o. female  Chief Complaint  Patient presents with  . Follow-up    Pt here for TSH check and medication refill.    HPI: Nancy Cuevas is a 75 y.o. female patient seen today for routine f/u on hypothyroidism, HDL, HTN. Pt takes levothyroxine 29mcg daily. For HTN, pt is not longer taking lopressor. For HLD, pt takes pravastatin 40mg  daily. She has no concerns or complaints today. Since last OV with me, she saw ortho for plantar fasciitis. It is being managed conservatively currently.   Lab Results  Component Value Date   TSH 1.57 05/01/2020   Lab Results  Component Value Date   NA 141 05/01/2020   K 4.1 05/01/2020   CREATININE 0.73 05/01/2020   GFRNONAA 87 (L) 06/15/2013   GFRAA >90 06/15/2013   GLUCOSE 90 05/01/2020   Lab Results  Component Value Date   CHOL 177 05/01/2020   HDL 74.10 05/01/2020   LDLCALC 90 05/01/2020   LDLDIRECT 112.9 03/23/2012   TRIG 68.0 05/01/2020   CHOLHDL 2 05/01/2020   Lab Results  Component Value Date   ALT 18 05/01/2020   AST 24 05/01/2020   ALKPHOS 75 09/05/2019   BILITOT 0.5 09/05/2019   Past Medical History:  Diagnosis Date  . 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 Bunion   . Contact dermatitis and other eczema, due to unspecified cause   . Contact dermatitis and other eczema, due to unspecified cause   . Disorder of bone and cartilage, unspecified   . Female stress incontinence   . HYPERLIPIDEMIA 10/27/2010  . Hyperpotassemia   . HYPOTHYROIDISM 07/18/2007  . Nonspecific (abnormal) findings on radiological and other examination of other intrathoracic organs   . OSTEOPENIA 03/24/2008  . Palpitations    Echo (1/12) EF 75-64%, mild diastolic dysfunction, atrial septal aneurysm. Holter (1/12): short runs of atrial tachycardia versus MAT. ;  Echo (14) EF 55-60%, normal wall motion, mild MR, mild LAE, atrial septal aneurysm  . Personal history of colonic and rectal adenomas 03/22/2013   . POSTMENOPAUSAL STATUS 02/07/2008  . Symptomatic menopausal or female climacteric states     Past Surgical History:  Procedure Laterality Date  . ABDOMINAL HYSTERECTOMY  1988   prolapse uterus  . CARPAL TUNNEL RELEASE  11/15/2011   Procedure: CARPAL TUNNEL RELEASE;  Surgeon: Tennis Must, MD;  Location: [facility];  Service: Orthopedics;  Laterality: Left;  . CATARACT EXTRACTION  2011  . COLONOSCOPY  [id number]   normal,  . COLONOSCOPY WITH PROPOFOL N/A 06/24/2017   Procedure: COLONOSCOPY WITH PROPOFOL;  Surgeon: Gatha Mayer, MD;  Location: [facility];  Service: Endoscopy;  Laterality: N/A;  . FOOT SURGERY Right 10/2014  . FRACTURE SURGERY  june 2006   left wrist  . HAND SURGERY  oct 2012 and jan 2013   Dr Dean--[facility]  . HOT HEMOSTASIS N/A 06/24/2017   Procedure: HOT HEMOSTASIS (ARGON PLASMA COAGULATION/BICAP);  Surgeon: Gatha Mayer, MD;  Location: [facility];  Service: Endoscopy;  Laterality: N/A;  . left wrist hardware removal 10/12  10/12  . natural birth  1962, 1972  . PARTIAL PROCTECTOMY BY TEM  06/22/2013   Procedure: PARTIAL PROCTECTOMY BY TEM;  Surgeon: Leighton Ruff, MD;  Location: [facility];  Service: General;;  . RETINAL DETACHMENT SURGERY  2009   left    Social History   Socioeconomic History  . Marital status: Married  Spouse name: Not on file  . Number of children: 2  . Years of education: Not on file  . Highest education level: Not on file  Occupational History  . Not on file  Tobacco Use  . Smoking status: Never Smoker  . Smokeless tobacco: Never Used  Vaping Use  . Vaping Use: Never used  Substance and Sexual Activity  . Alcohol use: No  . Drug use: No  . Sexual activity: Not Currently  Other Topics Concern  . Not on file  Social History Narrative   Married one son one daughter and 3 grandchildren    Likes to travel   No alcohol tobacco or drug use reported   exercise-- not regular    Social Determinants of Health    Financial Resource Strain: Low Risk   . Difficulty of Paying Living Expenses: Not hard at all  Food Insecurity: No Food Insecurity  . Worried About Charity fundraiser in the Last Year: Never true  . Ran Out of Food in the Last Year: Never true  Transportation Needs: No Transportation Needs  . Lack of Transportation (Medical): No  . Lack of Transportation (Non-Medical): No  Physical Activity: Sufficiently Active  . Days of Exercise per Week: 5 days  . Minutes of Exercise per Session: 30 min  Stress: No Stress Concern Present  . Feeling of Stress : Not at all  Social Connections: Socially Integrated  . Frequency of Communication with Friends and Family: More than three times a week  . Frequency of Social Gatherings with Friends and Family: More than three times a week  . Attends Religious Services: 1 to 4 times per year  . Active Member of Clubs or Organizations: Yes  . Attends Archivist Meetings: More than 4 times per year  . Marital Status: Married  Human resources officer Violence: Not At Risk  . Fear of Current or Ex-Partner: No  . Emotionally Abused: No  . Physically Abused: No  . Sexually Abused: No    Family History  Problem Relation Age of Onset  . Diabetes Mother        1st degree relative  . Hypertension Mother   . Stroke Mother   . Heart disease Father   . Breast cancer Other        1st degree relative <50  . Osteoporosis Other        1st degree relative  . Cancer Maternal Aunt   . Cancer Cousin   . Heart disease Cousin   . Alzheimer's disease Cousin   . Colon cancer Neg Hx   . Esophageal cancer Neg Hx   . Stomach cancer Neg Hx   . Rectal cancer Neg Hx      Immunization History  Administered Date(s) Administered  . Fluad Quad(high Dose 65+) 06/22/2019  . Influenza Split 09/17/2011  . Influenza Whole 08/30/2007, 07/31/2008, 09/02/2010, 09/01/2012  . Influenza, High Dose Seasonal PF 07/11/2013, 07/31/2014, 07/22/2015, 07/19/2016, 07/09/2017  .  Influenza-Unspecified 06/18/2020  . PFIZER(Purple Top)SARS-COV-2 Vaccination 11/20/2019, 12/11/2019, 09/01/2020  . Pneumococcal Conjugate-13 07/31/2014  . Pneumococcal Polysaccharide-23 09/21/2010, 07/19/2016  . Td 02/07/2008  . Zoster 02/07/2008  . Zoster Recombinat (Shingrix) 08/06/2019    Outpatient Encounter Medications as of 02/26/2021  Medication Sig  . acetaminophen (TYLENOL) 500 MG tablet Take 500 mg by mouth daily as needed for moderate pain or headache.  Marland Kitchen aspirin 81 MG tablet Take 81 mg by mouth every evening.  Marland Kitchen CALCIUM-MAGNESIUM-VITAMIN D PO Take 1 tablet by  mouth daily.  Marland Kitchen desonide (DESOWEN) 0.05 % cream Apply topically 2 (two) times daily.  . ferrous sulfate 325 (65 FE) MG tablet Take 325 mg by mouth daily.  . Flaxseed, Linseed, (FLAX SEED OIL) 1000 MG CAPS Take 1,000 mg by mouth daily.   . fluticasone (FLONASE) 50 MCG/ACT nasal spray Place 2 sprays into both nostrils daily.  . Hypromellose (ARTIFICIAL TEARS OP) Apply 1 drop to eye daily as needed (dry eyes).  Marland Kitchen levothyroxine (SYNTHROID) 50 MCG tablet TAKE 1 TABLET BY MOUTH EVERY DAY  . Multiple Vitamin (MULTIVITAMIN) tablet Take 1 tablet by mouth daily.  Marland Kitchen neomycin-bacitracin-polymyxin (NEOSPORIN) ointment Apply 1 application topically daily as needed for wound care.  . Omega-3 Fatty Acids (FISH OIL) 1000 MG CAPS Take 1 capsule by mouth 2 (two) times daily.   Marland Kitchen omeprazole (PRILOSEC) 20 MG capsule TAKE 1 CAPSULE BY MOUTH DAILY  . pravastatin (PRAVACHOL) 40 MG tablet TAKE 1 TABLET BY MOUTH EVERY DAY  . COVID-19 mRNA vaccine, Pfizer, 30 MCG/0.3ML injection INJECT AS DIRECTED  . [DISCONTINUED] metoprolol tartrate (LOPRESSOR) 25 MG tablet TAKE 1/2 TABLET BY MOUTH TWICE DAILY, MAY TAKE AN EXTRA 1/2 TABLET FOR PALPITATIONS AS NEEDED. Need appt with new provided for additional refills (Patient not taking: Reported on 02/26/2021)   No facility-administered encounter medications on file as of 02/26/2021.     ROS: Pertinent  positives and negatives noted in HPI. Remainder of ROS non-contributory   Allergies  Allergen Reactions  . Sulfonamide Derivatives Rash    BP 122/70 (BP Location: Left Arm, Patient Position: Sitting, Cuff Size: Normal)   Pulse 60   Temp 97.9 F (36.6 C) (Oral)   Ht 5' 3.25" (1.607 m)   Wt 138 lb 12.8 oz (63 kg)   SpO2 98%   BMI 24.39 kg/m   Wt Readings from Last 3 Encounters:  02/26/21 138 lb 12.8 oz (63 kg)  10/07/20 139 lb (63 kg)  05/01/20 139 lb (63 kg)   Temp Readings from Last 3 Encounters:  02/26/21 97.9 F (36.6 C) (Oral)  05/01/20 (!) 97.5 F (36.4 C) (Temporal)  04/15/20 (!) 97 F (36.1 C) (Temporal)   BP Readings from Last 3 Encounters:  02/26/21 122/70  05/01/20 118/70  04/15/20 98/60   Pulse Readings from Last 3 Encounters:  02/26/21 60  05/01/20 (!) 58  04/15/20 62     Physical Exam Constitutional:      General: She is not in acute distress.    Appearance: Normal appearance. She is not ill-appearing.  Cardiovascular:     Rate and Rhythm: Normal rate and regular rhythm.  Pulmonary:     Effort: Pulmonary effort is normal.     Breath sounds: Normal breath sounds.  Musculoskeletal:     Right lower leg: No edema.     Left lower leg: No edema.  Neurological:     Mental Status: She is alert and oriented to person, place, and time.      A/P:  1. Hypothyroidism (acquired) - on levothyroxine 51mcg daily, will adjust if needed based on lab results - T4, free - TSH  2. Essential hypertension - controlled, at goal off med - Basic metabolic panel  3. Hyperlipidemia LDL goal <100 - on pravastatin 40mg  daily, continue - Lipid panel - AST - ALT - Direct LDL    This visit occurred during the SARS-CoV-2 public health emergency.  Safety protocols were in place, including screening questions prior to the visit, additional usage of staff PPE, and extensive  cleaning of exam room while observing appropriate contact time as indicated for  disinfecting solutions.

## 2021-02-27 LAB — BASIC METABOLIC PANEL
BUN: 18 mg/dL (ref 6–23)
CO2: 27 mEq/L (ref 19–32)
Calcium: 9.6 mg/dL (ref 8.4–10.5)
Chloride: 104 mEq/L (ref 96–112)
Creatinine, Ser: 0.78 mg/dL (ref 0.40–1.20)
GFR: 73.62 mL/min (ref >60.00)
Glucose, Bld: 89 mg/dL (ref 70–99)
Potassium: 3.8 mEq/L (ref 3.5–5.1)
Sodium: 142 mEq/L (ref 135–145)

## 2021-02-27 LAB — LIPID PANEL
Cholesterol: 199 mg/dL (ref 0–200)
HDL: 82.2 mg/dL (ref >39.00)
LDL Cholesterol: 98 mg/dL (ref 0–99)
NonHDL: 116.56
Total CHOL/HDL Ratio: 2
Triglycerides: 95 mg/dL (ref 0.0–149.0)
VLDL: 19 mg/dL (ref 0.0–40.0)

## 2021-02-27 LAB — T4, FREE: Free T4: 0.93 ng/dL (ref 0.60–1.60)

## 2021-02-27 LAB — AST: AST: 20 U/L (ref 0–37)

## 2021-02-27 LAB — LDL CHOLESTEROL, DIRECT: Direct LDL: 97 mg/dL

## 2021-02-27 LAB — ALT: ALT: 13 U/L (ref 0–35)

## 2021-02-27 LAB — TSH: TSH: 2.57 u[IU]/mL (ref 0.35–4.50)

## 2021-03-19 DIAGNOSIS — M205X2 Other deformities of toe(s) (acquired), left foot: Secondary | ICD-10-CM | POA: Diagnosis not present

## 2021-03-19 DIAGNOSIS — M21962 Unspecified acquired deformity of left lower leg: Secondary | ICD-10-CM | POA: Diagnosis not present

## 2021-03-19 DIAGNOSIS — M722 Plantar fascial fibromatosis: Secondary | ICD-10-CM | POA: Diagnosis not present

## 2021-03-20 ENCOUNTER — Telehealth: Payer: Self-pay | Admitting: Family Medicine

## 2021-03-20 DIAGNOSIS — E785 Hyperlipidemia, unspecified: Secondary | ICD-10-CM

## 2021-03-20 DIAGNOSIS — R1310 Dysphagia, unspecified: Secondary | ICD-10-CM

## 2021-03-20 DIAGNOSIS — E039 Hypothyroidism, unspecified: Secondary | ICD-10-CM

## 2021-03-20 MED ORDER — PRAVASTATIN SODIUM 40 MG PO TABS: ORAL_TABLET | ORAL | 0 refills | Status: DC

## 2021-03-20 MED ORDER — OMEPRAZOLE 20 MG PO CPDR: DELAYED_RELEASE_CAPSULE | ORAL | 0 refills | Status: DC

## 2021-03-20 MED ORDER — LEVOTHYROXINE SODIUM 50 MCG PO TABS: ORAL_TABLET | ORAL | 0 refills | Status: DC

## 2021-03-20 NOTE — Telephone Encounter (Signed)
Pt called and said that the pharmacy should be sending over a refill request for  1. levothyroxine  2. pravastatin 3. Omeprazole  Pt is requesting 90d/s on these, she said she had already talked to Dr Bryan Lemma about it

## 2021-03-20 NOTE — Telephone Encounter (Signed)
Last OV 02/26/21 Last fill 02/16/21  #30/0

## 2021-04-01 ENCOUNTER — Other Ambulatory Visit: Payer: Self-pay | Admitting: Family Medicine

## 2021-04-01 DIAGNOSIS — E039 Hypothyroidism, unspecified: Secondary | ICD-10-CM

## 2021-04-01 DIAGNOSIS — R1310 Dysphagia, unspecified: Secondary | ICD-10-CM

## 2021-04-01 DIAGNOSIS — E785 Hyperlipidemia, unspecified: Secondary | ICD-10-CM

## 2021-04-16 ENCOUNTER — Other Ambulatory Visit: Payer: Self-pay

## 2021-04-16 ENCOUNTER — Ambulatory Visit: Payer: Medicare PPO | Admitting: Family Medicine

## 2021-04-16 ENCOUNTER — Encounter: Payer: Self-pay | Admitting: Family Medicine

## 2021-04-16 VITALS — BP 126/64 | HR 52 | Temp 97.0°F | Ht 63.5 in | Wt 136.0 lb

## 2021-04-16 DIAGNOSIS — N3001 Acute cystitis with hematuria: Secondary | ICD-10-CM

## 2021-04-16 LAB — POCT URINALYSIS DIPSTICK
Bilirubin, UA: NEGATIVE
Blood, UA: 80
Glucose, UA: NEGATIVE
Ketones, UA: NEGATIVE
Nitrite, UA: POSITIVE
Protein, UA: POSITIVE — AB
Spec Grav, UA: 1.025 (ref 1.010–1.025)
Urobilinogen, UA: 0.2 E.U./dL
pH, UA: 6 (ref 5.0–8.0)

## 2021-04-16 MED ORDER — NITROFURANTOIN MONOHYD MACRO 100 MG PO CAPS
100.0000 mg | ORAL_CAPSULE | Freq: Two times a day (BID) | ORAL | 0 refills | Status: DC
Start: 2021-04-16 — End: 2021-06-18

## 2021-04-16 MED ORDER — PHENAZOPYRIDINE HCL 100 MG PO TABS
100.0000 mg | ORAL_TABLET | Freq: Three times a day (TID) | ORAL | 0 refills | Status: DC | PRN
Start: 2021-04-16 — End: 2021-07-14

## 2021-04-16 NOTE — Patient Instructions (Signed)
Urinary Tract Infection, Adult A urinary tract infection (UTI) is an infection of any part of the urinary tract. The urinary tract includes the kidneys, ureters, bladder, and urethra. These organs make, store, and get rid of urine in the body. An upper UTI affects the ureters and kidneys. A lower UTI affects the bladder and urethra. What are the causes? Most urinary tract infections are caused by bacteria in your genital area around your urethra, where urine leaves your body. These bacteria grow and cause inflammation of your urinary tract. What increases the risk? You are more likely to develop this condition if: You have a urinary catheter that stays in place. You are not able to control when you urinate or have a bowel movement (incontinence). You are female and you: Use a spermicide or diaphragm for birth control. Have low estrogen levels. Are pregnant. You have certain genes that increase your risk. You are sexually active. You take antibiotic medicines. You have a condition that causes your flow of urine to slow down, such as: An enlarged prostate, if you are female. Blockage in your urethra. A kidney stone. A nerve condition that affects your bladder control (neurogenic bladder). Not getting enough to drink, or not urinating often. You have certain medical conditions, such as: Diabetes. A weak disease-fighting system (immunesystem). Sickle cell disease. Gout. Spinal cord injury. What are the signs or symptoms? Symptoms of this condition include: Needing to urinate right away (urgency). Frequent urination. This may include small amounts of urine each time you urinate. Pain or burning with urination. Blood in the urine. Urine that smells bad or unusual. Trouble urinating. Cloudy urine. Vaginal discharge, if you are female. Pain in the abdomen or the lower back. You may also have: Vomiting or a decreased appetite. Confusion. Irritability or tiredness. A fever or  chills. Diarrhea. The first symptom in older adults may be confusion. In some cases, they may not have any symptoms until the infection has worsened. How is this diagnosed? This condition is diagnosed based on your medical history and a physical exam. You may also have other tests, including: Urine tests. Blood tests. Tests for STIs (sexually transmitted infections). If you have had more than one UTI, a cystoscopy or imaging studies may be done to determine the cause of the infections. How is this treated? Treatment for this condition includes: Antibiotic medicine. Over-the-counter medicines to treat discomfort. Drinking enough water to stay hydrated. If you have frequent infections or have other conditions such as a kidney stone, you may need to see a health care provider who specializes in the urinary tract (urologist). In rare cases, urinary tract infections can cause sepsis. Sepsis is a life-threatening condition that occurs when the body responds to an infection. Sepsis is treated in the hospital with IV antibiotics, fluids, and other medicines. Follow these instructions at home: Medicines Take over-the-counter and prescription medicines only as told by your health care provider. If you were prescribed an antibiotic medicine, take it as told by your health care provider. Do not stop using the antibiotic even if you start to feel better. General instructions Make sure you: Empty your bladder often and completely. Do not hold urine for long periods of time. Empty your bladder after sex. Wipe from front to back after urinating or having a bowel movement if you are female. Use each tissue only one time when you wipe. Drink enough fluid to keep your urine pale yellow. Keep all follow-up visits. This is important. Contact a health care provider   if: Your symptoms do not get better after 1-2 days. Your symptoms go away and then return. Get help right away if: You have severe pain in your  back or your lower abdomen. You have a fever or chills. You have nausea or vomiting. Summary A urinary tract infection (UTI) is an infection of any part of the urinary tract, which includes the kidneys, ureters, bladder, and urethra. Most urinary tract infections are caused by bacteria in your genital area. Treatment for this condition often includes antibiotic medicines. If you were prescribed an antibiotic medicine, take it as told by your health care provider. Do not stop using the antibiotic even if you start to feel better. Keep all follow-up visits. This is important. This information is not intended to replace advice given to you by your health care provider. Make sure you discuss any questions you have with your health care provider. Document Revised: 05/16/2020 Document Reviewed: 05/16/2020 Elsevier Patient Education  2022 Elsevier Inc.  

## 2021-04-16 NOTE — Progress Notes (Signed)
[facility] [address] [place] [address] Dept: [phone] Dept Fax: [phone]  Office Visit  Subjective:    Patient ID: Nancy Cuevas, female    DOB: [birth year], 75 y.o..   MRN: [id number]  Chief Complaint  Patient presents with   Acute Visit    C/o having pain and frequency x 4 days.      History of Present Illness:  Patient is in today complaining of a 4 day history of dysuria and urinary frequency. She notes she has had bladder infections in the past, so is familiar with how they feel. She has a history of sulfa allergy.  Past Medical History: Patient Active Problem List   Diagnosis Date Noted   Ear problem, bilateral 09/05/2019   Atrial tachycardia ([place]) 09/04/2019   Stricture and stenosis of esophagus 09/04/2019   Osteopenia 09/04/2019   History of colonic polyps 03/22/2013   ECHOCARDIOGRAM, ABNORMAL 11/02/2010   Hyperlipidemia 10/27/2010   FEMALE STRESS INCONTINENCE 10/27/2010   HYPERPOTASSEMIA 10/14/2010   Disorder of bone and cartilage 03/24/2008   PALPITATIONS, OCCASIONAL 02/07/2008   Hypothyroidism 07/18/2007   Past Surgical History:  Procedure Laterality Date   ABDOMINAL HYSTERECTOMY  1988   prolapse uterus   CARPAL TUNNEL RELEASE  11/15/2011   Procedure: CARPAL TUNNEL RELEASE;  Surgeon: Tennis Must, MD;  Location: [facility];  Service: Orthopedics;  Laterality: Left;   CATARACT EXTRACTION  2011   COLONOSCOPY  2003,2014   normal,   COLONOSCOPY WITH PROPOFOL N/A 06/24/2017   Procedure: COLONOSCOPY WITH PROPOFOL;  Surgeon: Gatha Mayer, MD;  Location: [facility];  Service: Endoscopy;  Laterality: N/A;   FOOT SURGERY Right 10/2014   FRACTURE SURGERY  june 2006   left wrist   HAND SURGERY  oct 2012 and jan 2013   Dr Dean--[facility]   HOT HEMOSTASIS N/A 06/24/2017   Procedure: HOT HEMOSTASIS (ARGON PLASMA COAGULATION/BICAP);  Surgeon: Gatha Mayer, MD;   Location: [facility];  Service: Endoscopy;  Laterality: N/A;   left wrist hardware removal 10/12  10/12   natural birth  1962, 1972   PARTIAL PROCTECTOMY BY TEM  06/22/2013   Procedure: PARTIAL PROCTECTOMY BY TEM;  Surgeon: Leighton Ruff, MD;  Location: [facility];  Service: General;;   RETINAL DETACHMENT SURGERY  2009   left   Family History  Problem Relation Age of Onset   Diabetes Mother        1st degree relative   Hypertension Mother    Stroke Mother    Heart disease Father    Breast cancer Other        1st degree relative <50   Osteoporosis Other        1st degree relative   Cancer Maternal Aunt    Cancer Cousin    Heart disease Cousin    Alzheimer's disease Cousin    Colon cancer Neg Hx    Esophageal cancer Neg Hx    Stomach cancer Neg Hx    Rectal cancer Neg Hx    Outpatient Medications Prior to Visit  Medication Sig Dispense Refill   acetaminophen (TYLENOL) 500 MG tablet Take 500 mg by mouth daily as needed for moderate pain or headache.     aspirin 81 MG tablet Take 81 mg by mouth every evening.     CALCIUM-MAGNESIUM-VITAMIN D PO Take 1 tablet by mouth daily.     desonide (DESOWEN) 0.05 % cream Apply topically 2 (two) times  daily. 30 g 0   ferrous sulfate 325 (65 FE) MG tablet Take 325 mg by mouth daily.     Flaxseed, Linseed, (FLAX SEED OIL) 1000 MG CAPS Take 1,000 mg by mouth daily.      fluticasone (FLONASE) 50 MCG/ACT nasal spray Place 2 sprays into both nostrils daily. 16 g 1   Hypromellose (ARTIFICIAL TEARS OP) Apply 1 drop to eye daily as needed (dry eyes).     levothyroxine (SYNTHROID) 50 MCG tablet TAKE 1 TABLET BY MOUTH EVERY DAY 90 tablet 3   Multiple Vitamin (MULTIVITAMIN) tablet Take 1 tablet by mouth daily.     neomycin-bacitracin-polymyxin (NEOSPORIN) ointment Apply 1 application topically daily as needed for wound care.     Omega-3 Fatty Acids (FISH OIL) 1000 MG CAPS Take 1 capsule by mouth 2 (two) times daily.      omeprazole (PRILOSEC) 20 MG capsule  TAKE 1 CAPSULE BY MOUTH DAILY 90 capsule 3   pravastatin (PRAVACHOL) 40 MG tablet TAKE 1 TABLET BY MOUTH EVERY DAY 90 tablet 3   COVID-19 mRNA vaccine, Pfizer, 30 MCG/0.3ML injection INJECT AS DIRECTED (Patient not taking: Reported on 04/16/2021) .3 mL 0   No facility-administered medications prior to visit.   Allergies  Allergen Reactions   Sulfonamide Derivatives Rash   Objective:   Today's Vitals   04/16/21 1011  BP: 126/64  Pulse: (!) 52  Temp: (!) 97 F (36.1 C)  TempSrc: Temporal  SpO2: 96%  Weight: 136 lb (61.7 kg)  Height: 5' 3.5" (1.613 m)   Body mass index is 23.71 kg/m.   General: Well developed, well nourished. No acute distress. Psych: Alert and oriented. Normal mood and affect.  Health Maintenance Due  Topic Date Due   TETANUS/TDAP  02/06/2018   Zoster Vaccines- Shingrix (2 of 2) 10/01/2019   COLONOSCOPY (Pts 45-54yrs Insurance coverage will need to be confirmed)  06/24/2020   MAMMOGRAM  12/03/2020   COVID-19 Vaccine (4 - Booster for Pfizer series) 12/30/2020   Lab results: Urine dipstick shows negative for glucose, ketones, urobilinogen, positive for nitrites, leukocytes, red blood cells, protein.    Assessment & Plan:   1. Acute cystitis with hematuria Urine consistent with acute UTI. I will treate her with 5 days of Macrobid. I will add Pyridium for the first 2 days to relieve dysuria. IU recommend she push fluids. Follow up as needed.  - POCT Urinalysis Dipstick - nitrofurantoin, macrocrystal-monohydrate, (MACROBID) 100 MG capsule; Take 1 capsule (100 mg total) by mouth 2 (two) times daily.  Dispense: 10 capsule; Refill: 0 - phenazopyridine (PYRIDIUM) 100 MG tablet; Take 1 tablet (100 mg total) by mouth 3 (three) times daily as needed for pain.  Dispense: 6 tablet; Refill: 0 - Urine Culture  Haydee Salter, MD

## 2021-04-29 DIAGNOSIS — H16202 Unspecified keratoconjunctivitis, left eye: Secondary | ICD-10-CM | POA: Diagnosis not present

## 2021-06-17 ENCOUNTER — Other Ambulatory Visit: Payer: Self-pay

## 2021-06-18 ENCOUNTER — Ambulatory Visit (INDEPENDENT_AMBULATORY_CARE_PROVIDER_SITE_OTHER): Payer: Medicare PPO | Admitting: Family Medicine

## 2021-06-18 ENCOUNTER — Encounter: Payer: Self-pay | Admitting: Family Medicine

## 2021-06-18 VITALS — BP 92/60 | HR 69 | Temp 96.8°F | Ht 63.5 in | Wt 135.8 lb

## 2021-06-18 DIAGNOSIS — N3001 Acute cystitis with hematuria: Secondary | ICD-10-CM

## 2021-06-18 DIAGNOSIS — R319 Hematuria, unspecified: Secondary | ICD-10-CM | POA: Insufficient documentation

## 2021-06-18 DIAGNOSIS — I83892 Varicose veins of left lower extremities with other complications: Secondary | ICD-10-CM | POA: Diagnosis not present

## 2021-06-18 DIAGNOSIS — I83813 Varicose veins of bilateral lower extremities with pain: Secondary | ICD-10-CM | POA: Diagnosis not present

## 2021-06-18 LAB — URINALYSIS, ROUTINE W REFLEX MICROSCOPIC
Leukocytes,Ua: NEGATIVE
Nitrite: POSITIVE — AB
Specific Gravity, Urine: >=1.03 — AB (ref 1.000–1.030)
Total Protein, Urine: >=300 — AB
Urine Glucose: NEGATIVE
Urobilinogen, UA: 1 (ref 0.0–1.0)
pH: 6.5 (ref 5.0–8.0)

## 2021-06-18 LAB — POCT URINALYSIS DIPSTICK
Bilirubin, UA: 1
Blood, UA: 200
Glucose, UA: NEGATIVE
Ketones, UA: 5
Nitrite, UA: POSITIVE
Protein, UA: POSITIVE — AB
Spec Grav, UA: >=1.03 — AB (ref 1.010–1.025)
Urobilinogen, UA: 2 E.U./dL — AB
pH, UA: 5 (ref 5.0–8.0)

## 2021-06-18 MED ORDER — NITROFURANTOIN MONOHYD MACRO 100 MG PO CAPS: 100.0000 mg | ORAL_CAPSULE | Freq: Two times a day (BID) | ORAL | 0 refills | Status: DC

## 2021-06-18 NOTE — Patient Instructions (Signed)
Urinary Tract Infection, Adult A urinary tract infection (UTI) is an infection of any part of the urinary tract. The urinary tract includes the kidneys, ureters, bladder, and urethra. These organs make, store, and get rid of urine in the body. An upper UTI affects the ureters and kidneys. A lower UTI affects the bladder and urethra. What are the causes? Most urinary tract infections are caused by bacteria in your genital area around your urethra, where urine leaves your body. These bacteria grow and cause inflammation of your urinary tract. What increases the risk? You are more likely to develop this condition if: You have a urinary catheter that stays in place. You are not able to control when you urinate or have a bowel movement (incontinence). You are female and you: Use a spermicide or diaphragm for birth control. Have low estrogen levels. Are pregnant. You have certain genes that increase your risk. You are sexually active. You take antibiotic medicines. You have a condition that causes your flow of urine to slow down, such as: An enlarged prostate, if you are female. Blockage in your urethra. A kidney stone. A nerve condition that affects your bladder control (neurogenic bladder). Not getting enough to drink, or not urinating often. You have certain medical conditions, such as: Diabetes. A weak disease-fighting system (immunesystem). Sickle cell disease. Gout. Spinal cord injury. What are the signs or symptoms? Symptoms of this condition include: Needing to urinate right away (urgency). Frequent urination. This may include small amounts of urine each time you urinate. Pain or burning with urination. Blood in the urine. Urine that smells bad or unusual. Trouble urinating. Cloudy urine. Vaginal discharge, if you are female. Pain in the abdomen or the lower back. You may also have: Vomiting or a decreased appetite. Confusion. Irritability or tiredness. A fever or  chills. Diarrhea. The first symptom in older adults may be confusion. In some cases, they may not have any symptoms until the infection has worsened. How is this diagnosed? This condition is diagnosed based on your medical history and a physical exam. You may also have other tests, including: Urine tests. Blood tests. Tests for STIs (sexually transmitted infections). If you have had more than one UTI, a cystoscopy or imaging studies may be done to determine the cause of the infections. How is this treated? Treatment for this condition includes: Antibiotic medicine. Over-the-counter medicines to treat discomfort. Drinking enough water to stay hydrated. If you have frequent infections or have other conditions such as a kidney stone, you may need to see a health care provider who specializes in the urinary tract (urologist). In rare cases, urinary tract infections can cause sepsis. Sepsis is a life-threatening condition that occurs when the body responds to an infection. Sepsis is treated in the hospital with IV antibiotics, fluids, and other medicines. Follow these instructions at home: Medicines Take over-the-counter and prescription medicines only as told by your health care provider. If you were prescribed an antibiotic medicine, take it as told by your health care provider. Do not stop using the antibiotic even if you start to feel better. General instructions Make sure you: Empty your bladder often and completely. Do not hold urine for long periods of time. Empty your bladder after sex. Wipe from front to back after urinating or having a bowel movement if you are female. Use each tissue only one time when you wipe. Drink enough fluid to keep your urine pale yellow. Keep all follow-up visits. This is important. Contact a health care provider   if: Your symptoms do not get better after 1-2 days. Your symptoms go away and then return. Get help right away if: You have severe pain in your  back or your lower abdomen. You have a fever or chills. You have nausea or vomiting. Summary A urinary tract infection (UTI) is an infection of any part of the urinary tract, which includes the kidneys, ureters, bladder, and urethra. Most urinary tract infections are caused by bacteria in your genital area. Treatment for this condition often includes antibiotic medicines. If you were prescribed an antibiotic medicine, take it as told by your health care provider. Do not stop using the antibiotic even if you start to feel better. Keep all follow-up visits. This is important. This information is not intended to replace advice given to you by your health care provider. Make sure you discuss any questions you have with your health care provider. Document Revised: 05/16/2020 Document Reviewed: 05/16/2020 Elsevier Patient Education  2022 Elsevier Inc.  

## 2021-06-18 NOTE — Progress Notes (Signed)
[facility] [address] [place] [address] Dept: [phone] Dept Fax: [phone]  Office Visit  Subjective:    Patient ID: Audrea Muscat Griffin Basil, female    DOB: 22-Jul-1946, 75 y.o..   MRN: [id number]  Chief Complaint  Patient presents with   Acute Visit   Hematuria    Pt c/o kidney infection with blood in urine, right flank pain x 4days.     History of Present Illness:  Patient is in today complaining of right lower back pain starting on Monday. This was associated with chills. On Tuesday, she began having some blood in the urine. She has not had dysuria. She denies fever, N/V, or lightheadedness. Ms. Arntz had a UTI diagnosed on June 30th. She took 5 days of nitrofurantoin. She feels that this infection cleared up at the time, but is unsure.  Past Medical History: Patient Active Problem List   Diagnosis Date Noted   Hematuria 06/18/2021   Ear problem, bilateral 09/05/2019   Atrial tachycardia ([place]) 09/04/2019   Stricture and stenosis of esophagus 09/04/2019   Osteopenia 09/04/2019   History of colonic polyps 03/22/2013   ECHOCARDIOGRAM, ABNORMAL 11/02/2010   Hyperlipidemia 10/27/2010   FEMALE STRESS INCONTINENCE 10/27/2010   HYPERPOTASSEMIA 10/14/2010   Disorder of bone and cartilage 03/24/2008   PALPITATIONS, OCCASIONAL 02/07/2008   Hypothyroidism 07/18/2007   Past Surgical History:  Procedure Laterality Date   ABDOMINAL HYSTERECTOMY  1988   prolapse uterus   CARPAL TUNNEL RELEASE  11/15/2011   Procedure: CARPAL TUNNEL RELEASE;  Surgeon: Tennis Must, MD;  Location: [facility];  Service: Orthopedics;  Laterality: Left;   CATARACT EXTRACTION  2011   COLONOSCOPY  2003,2014   normal,   COLONOSCOPY WITH PROPOFOL N/A 06/24/2017   Procedure: COLONOSCOPY WITH PROPOFOL;  Surgeon: Gatha Mayer, MD;  Location: [facility];  Service: Endoscopy;  Laterality: N/A;   FOOT SURGERY Right 10/2014    FRACTURE SURGERY  june 2006   left wrist   HAND SURGERY  oct 2012 and jan 2013   Dr Dean--[facility]   HOT HEMOSTASIS N/A 06/24/2017   Procedure: HOT HEMOSTASIS (ARGON PLASMA COAGULATION/BICAP);  Surgeon: Gatha Mayer, MD;  Location: [facility];  Service: Endoscopy;  Laterality: N/A;   left wrist hardware removal 10/12  10/12   natural birth  1962, 1972   PARTIAL PROCTECTOMY BY TEM  06/22/2013   Procedure: PARTIAL PROCTECTOMY BY TEM;  Surgeon: Leighton Ruff, MD;  Location: [facility];  Service: General;;   RETINAL DETACHMENT SURGERY  2009   left   Family History  Problem Relation Age of Onset   Diabetes Mother        1st degree relative   Hypertension Mother    Stroke Mother    Heart disease Father    Breast cancer Other        1st degree relative <50   Osteoporosis Other        1st degree relative   Cancer Maternal Aunt    Cancer Cousin    Heart disease Cousin    Alzheimer's disease Cousin    Colon cancer Neg Hx    Esophageal cancer Neg Hx    Stomach cancer Neg Hx    Rectal cancer Neg Hx    Outpatient Medications Prior to Visit  Medication Sig Dispense Refill   acetaminophen (TYLENOL) 500 MG tablet Take 500 mg by mouth daily as needed for moderate pain or headache.  aspirin 81 MG tablet Take 81 mg by mouth every evening.     CALCIUM-MAGNESIUM-VITAMIN D PO Take 1 tablet by mouth daily.     desonide (DESOWEN) 0.05 % cream Apply topically 2 (two) times daily. 30 g 0   ferrous sulfate 325 (65 FE) MG tablet Take 325 mg by mouth daily.     Flaxseed, Linseed, (FLAX SEED OIL) 1000 MG CAPS Take 1,000 mg by mouth daily.      fluticasone (FLONASE) 50 MCG/ACT nasal spray Place 2 sprays into both nostrils daily. 16 g 1   Hypromellose (ARTIFICIAL TEARS OP) Apply 1 drop to eye daily as needed (dry eyes).     levothyroxine (SYNTHROID) 50 MCG tablet TAKE 1 TABLET BY MOUTH EVERY DAY 90 tablet 3   Multiple Vitamin (MULTIVITAMIN) tablet Take 1 tablet by mouth daily.      neomycin-bacitracin-polymyxin (NEOSPORIN) ointment Apply 1 application topically daily as needed for wound care.     Omega-3 Fatty Acids (FISH OIL) 1000 MG CAPS Take 1 capsule by mouth 2 (two) times daily.      omeprazole (PRILOSEC) 20 MG capsule TAKE 1 CAPSULE BY MOUTH DAILY 90 capsule 3   phenazopyridine (PYRIDIUM) 100 MG tablet Take 1 tablet (100 mg total) by mouth 3 (three) times daily as needed for pain. 6 tablet 0   pravastatin (PRAVACHOL) 40 MG tablet TAKE 1 TABLET BY MOUTH EVERY DAY 90 tablet 3   nitrofurantoin, macrocrystal-monohydrate, (MACROBID) 100 MG capsule Take 1 capsule (100 mg total) by mouth 2 (two) times daily. 10 capsule 0   No facility-administered medications prior to visit.   Allergies  Allergen Reactions   Sulfonamide Derivatives Rash    Objective:   Today's Vitals   06/18/21 0912  BP: 92/60  Pulse: 69  Temp: (!) 96.8 F (36 C)  TempSrc: Temporal  SpO2: 98%  Weight: 135 lb 12.8 oz (61.6 kg)  Height: 5' 3.5" (1.613 m)   Body mass index is 23.68 kg/m.   General: Well developed, well nourished. No acute distress. Back: Straight. No CVA tenderness bilaterally. Psych: Alert and oriented. Normal mood and affect.  Health Maintenance Due  Topic Date Due   TETANUS/TDAP  02/06/2018   Zoster Vaccines- Shingrix (2 of 2) 10/01/2019   COLONOSCOPY (Pts 45-50yr Insurance coverage will need to be confirmed)  06/24/2020   MAMMOGRAM  12/03/2020   COVID-19 Vaccine (4 - Booster for Pfizer series) 12/30/2020   INFLUENZA VACCINE  05/18/2021   Lab Results Urine dipstick shows negative for glucose, positive for nitrites, leukocytes, red blood cells, protein, ketones, urobilinogen.  Assessment & Plan:   1. Acute cystitis with hematuria Ms. SGrannisappears to have a UTI. It is unclear if this was recurrent or a new infection. I will place her back on nitrofurantoin, have her urine cultured today, and have her return in 2 weeks for a repeat UA.  - POCT Urinalysis  Dipstick - Urinalysis, Routine w reflex microscopic - Urine Culture - nitrofurantoin, macrocrystal-monohydrate, (MACROBID) 100 MG capsule; Take 1 capsule (100 mg total) by mouth 2 (two) times daily.  Dispense: 14 capsule; Refill: 0 - Urinalysis w microscopic + reflex cultur; Future  SHaydee Salter MD

## 2021-06-19 LAB — URINE CULTURE
MICRO NUMBER:: 12321918
Result:: NO GROWTH
SPECIMEN QUALITY:: ADEQUATE

## 2021-06-24 ENCOUNTER — Other Ambulatory Visit: Payer: Medicare PPO

## 2021-06-24 ENCOUNTER — Other Ambulatory Visit: Payer: Self-pay

## 2021-06-24 DIAGNOSIS — N3001 Acute cystitis with hematuria: Secondary | ICD-10-CM | POA: Diagnosis not present

## 2021-06-25 ENCOUNTER — Telehealth: Payer: Self-pay | Admitting: Family Medicine

## 2021-06-25 LAB — URINALYSIS W MICROSCOPIC + REFLEX CULTURE
Bacteria, UA: NONE SEEN /HPF
Bilirubin Urine: NEGATIVE
Glucose, UA: NEGATIVE
Hyaline Cast: NONE SEEN /LPF
Ketones, ur: NEGATIVE
Leukocyte Esterase: NEGATIVE
Nitrites, Initial: NEGATIVE
Protein, ur: NEGATIVE
Specific Gravity, Urine: 1.015 (ref 1.001–1.035)
Squamous Epithelial / HPF: NONE SEEN /HPF (ref <=5)
WBC, UA: NONE SEEN /HPF (ref 0–5)
pH: 5.5 (ref 5.0–8.0)

## 2021-06-25 LAB — NO CULTURE INDICATED

## 2021-06-25 NOTE — Telephone Encounter (Signed)
Spoke to patient and advised that results haven't been reviewed yet and as soon as they are we will call her.  Dm/cma

## 2021-06-25 NOTE — Telephone Encounter (Signed)
Pt would like a call back concerning her most recent lab results. Please advise

## 2021-06-26 ENCOUNTER — Encounter (HOSPITAL_BASED_OUTPATIENT_CLINIC_OR_DEPARTMENT_OTHER): Payer: Self-pay | Admitting: *Deleted

## 2021-06-26 ENCOUNTER — Emergency Department (HOSPITAL_BASED_OUTPATIENT_CLINIC_OR_DEPARTMENT_OTHER)
Admission: EM | Admit: 2021-06-26 | Discharge: 2021-06-26 | Disposition: A | Discharge status: Home / Self Care | Payer: Medicare PPO | Attending: Emergency Medicine | Admitting: Emergency Medicine

## 2021-06-26 ENCOUNTER — Other Ambulatory Visit: Payer: Self-pay

## 2021-06-26 DIAGNOSIS — Z79899 Other long term (current) drug therapy: Secondary | ICD-10-CM | POA: Insufficient documentation

## 2021-06-26 DIAGNOSIS — E039 Hypothyroidism, unspecified: Secondary | ICD-10-CM | POA: Insufficient documentation

## 2021-06-26 DIAGNOSIS — Z85038 Personal history of other malignant neoplasm of large intestine: Secondary | ICD-10-CM | POA: Insufficient documentation

## 2021-06-26 DIAGNOSIS — N3001 Acute cystitis with hematuria: Secondary | ICD-10-CM | POA: Diagnosis not present

## 2021-06-26 DIAGNOSIS — Z7982 Long term (current) use of aspirin: Secondary | ICD-10-CM | POA: Insufficient documentation

## 2021-06-26 DIAGNOSIS — R350 Frequency of micturition: Secondary | ICD-10-CM | POA: Diagnosis present

## 2021-06-26 LAB — URINALYSIS, ROUTINE W REFLEX MICROSCOPIC
Bilirubin Urine: NEGATIVE
Glucose, UA: NEGATIVE mg/dL
Ketones, ur: NEGATIVE mg/dL
Leukocytes,Ua: NEGATIVE
Nitrite: NEGATIVE
Protein, ur: NEGATIVE mg/dL
Specific Gravity, Urine: 1.015 (ref 1.005–1.030)
pH: 6.5 (ref 5.0–8.0)

## 2021-06-26 LAB — URINALYSIS, MICROSCOPIC (REFLEX): RBC / HPF: >50 RBC/hpf (ref 0–5)

## 2021-06-26 MED ORDER — CEPHALEXIN 500 MG PO CAPS: 500.0000 mg | ORAL_CAPSULE | Freq: Two times a day (BID) | ORAL | 0 refills | Status: DC

## 2021-06-26 NOTE — ED Provider Notes (Signed)
[facility] Provider Note   CSN: [id number] Arrival date & time: 06/26/21  [id number]     History Chief Complaint  Patient presents with   Urinary Frequency    Nancy Cuevas Jameeka Deems is a 75 y.o. female with a past medical history of recent UTI infection, who presents with ongoing urinary frequency, dysuria, suprapubic pain, flank pain.  Patient reports that she was treated for UTI at the end of July, feels that resolved.  Patient then had recurrence of UTI symptoms at the end of last month, and was seen about a week ago, prescribed a week of Macrobid.  Patient finished her prescription, however still endorsing urinary symptoms.  Patient denies fever, nausea, vomiting.  Patient does endorse some bowel movements with urination recently.  Patient denies chest pain, shortness of breath, lower leg swelling.   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 Holter (1/12): short runs of atrial tachycardia versus MAT. ;  Echo (14) EF 55-60%, normal wall motion, mild MR, mild LAE, atrial septal aneurysm   Personal history of colonic and rectal adenomas 03/22/2013   POSTMENOPAUSAL STATUS 02/07/2008   Symptomatic menopausal or female climacteric states     Patient Active Problem List   Diagnosis Date Noted   Hematuria 06/18/2021   Ear problem, bilateral 09/05/2019   Atrial tachycardia ([place])  09/04/2019   Stricture and stenosis of esophagus 09/04/2019   Osteopenia 09/04/2019   History of colonic polyps 03/22/2013   ECHOCARDIOGRAM, ABNORMAL 11/02/2010   Hyperlipidemia 10/27/2010   FEMALE STRESS INCONTINENCE 10/27/2010   HYPERPOTASSEMIA 10/14/2010   Disorder of bone and cartilage 03/24/2008   PALPITATIONS, OCCASIONAL 02/07/2008   Hypothyroidism 07/18/2007    Past Surgical History:  Procedure Laterality Date   ABDOMINAL HYSTERECTOMY  1988   prolapse uterus   CARPAL TUNNEL RELEASE  11/15/2011   Procedure: CARPAL TUNNEL RELEASE;  Surgeon: Tennis Must, MD;  Location: [facility];  Service: Orthopedics;  Laterality: Left;   CATARACT EXTRACTION  2011   COLONOSCOPY  2003,2014   normal,   COLONOSCOPY WITH PROPOFOL N/A 06/24/2017   Procedure: COLONOSCOPY WITH PROPOFOL;  Surgeon: Gatha Mayer, MD;  Location: [facility];  Service: Endoscopy;  Laterality: N/A;   FOOT SURGERY Right 10/2014   FRACTURE SURGERY  june 2006   left wrist   HAND SURGERY  oct 2012 and jan 2013   Dr Dean--[facility]   HOT HEMOSTASIS N/A 06/24/2017   Procedure: HOT HEMOSTASIS (ARGON PLASMA COAGULATION/BICAP);  Surgeon: Gatha Mayer, MD;  Location: [facility];  Service: Endoscopy;  Laterality: N/A;   left wrist hardware removal 10/12  10/12   natural birth  1962, 1972   PARTIAL PROCTECTOMY BY TEM  06/22/2013   Procedure: PARTIAL PROCTECTOMY BY TEM;  Surgeon: Leighton Ruff, MD;  Location: [facility];  Service: General;;   RETINAL DETACHMENT SURGERY  2009  left     OB History   No obstetric history on file.     Family History  Problem Relation Age of Onset   Diabetes Mother        1st degree relative   Hypertension Mother    Stroke Mother    Heart disease Father    Breast cancer Other        1st degree relative <50   Osteoporosis Other        1st degree relative   Cancer Maternal Aunt    Cancer Cousin    Heart disease Cousin    Alzheimer's disease Cousin    Colon cancer  Neg Hx    Esophageal cancer Neg Hx    Stomach cancer Neg Hx    Rectal cancer Neg Hx     Social History   Tobacco Use   Smoking status: Never   Smokeless tobacco: Never  Vaping Use   Vaping Use: Never used  Substance Use Topics   Alcohol use: No   Drug use: No    Home Medications Prior to Admission medications   Medication Sig Start Date End Date Taking? Authorizing Provider  aspirin 81 MG tablet Take 81 mg by mouth every evening.   Yes [provider]  CALCIUM-MAGNESIUM-VITAMIN D PO Take 1 tablet by mouth daily.   Yes [provider]  cephALEXin (KEFLEX) 500 MG capsule Take 1 capsule (500 mg total) by mouth 2 (two) times daily. 06/26/21  Yes Cantrell Martus H, PA-C  ferrous sulfate 325 (65 FE) MG tablet Take 325 mg by mouth daily.   Yes [provider]  Flaxseed, Linseed, (FLAX SEED OIL) 1000 MG CAPS Take 1,000 mg by mouth daily.    Yes [provider]  Hypromellose (ARTIFICIAL TEARS OP) Apply 1 drop to eye daily as needed (dry eyes).   Yes [provider]  levothyroxine (SYNTHROID) 50 MCG tablet TAKE 1 TABLET BY MOUTH EVERY DAY 04/01/21  Yes Cirigliano, Solimar K, DO  Multiple Vitamin (MULTIVITAMIN) tablet Take 1 tablet by mouth daily.   Yes [provider]  nitrofurantoin, macrocrystal-monohydrate, (MACROBID) 100 MG capsule Take 1 capsule (100 mg total) by mouth 2 (two) times daily. 06/18/21  Yes Haydee Salter, MD  pravastatin (PRAVACHOL) 40 MG tablet TAKE 1 TABLET BY MOUTH EVERY DAY 04/01/21  Yes Cirigliano, Garvin Fila, DO  acetaminophen (TYLENOL) 500 MG tablet Take 500 mg by mouth daily as needed for moderate pain or headache.    [provider]  desonide (DESOWEN) 0.05 % cream Apply topically 2 (two) times daily. 09/05/19   Lucille Passy, MD  fluticasone (FLONASE) 50 MCG/ACT nasal spray Place 2 sprays into both nostrils daily. 01/23/16   Saguier, Percell Miller, PA-C  neomycin-bacitracin-polymyxin (NEOSPORIN) ointment Apply 1  application topically daily as needed for wound care.    [provider]  Omega-3 Fatty Acids (FISH OIL) 1000 MG CAPS Take 1 capsule by mouth 2 (two) times daily.     [provider]  omeprazole (PRILOSEC) 20 MG capsule TAKE 1 CAPSULE BY MOUTH DAILY 04/01/21   Cirigliano, Garvin Fila, DO  phenazopyridine (PYRIDIUM) 100 MG tablet Take 1 tablet (100 mg total) by mouth 3 (three) times daily as needed for pain. 04/16/21   Haydee Salter, MD    Allergies    Sulfonamide derivatives  Review of Systems   Review of Systems  Constitutional:  Negative for fever.  Genitourinary:  Positive for dysuria, frequency and hematuria.  All other systems  reviewed and are negative.  Physical Exam Updated Vital Signs BP (!) 154/66   Pulse 65   Temp 98 F (36.7 C) (Oral)   Resp 16   Ht '5\' 4"'$  (1.626 m)   Wt 62.1 kg   SpO2 98%   BMI 23.52 kg/m   Physical Exam Vitals and nursing note reviewed.  Constitutional:      General: She is not in acute distress.    Appearance: Normal appearance.  HENT:     Head: Normocephalic and atraumatic.  Eyes:     General:        Right eye: No discharge.        Left eye: No discharge.  Cardiovascular:     Rate and Rhythm: Normal rate and regular rhythm.     Heart sounds: No murmur heard.   No friction rub. No gallop.  Pulmonary:     Effort: Pulmonary effort is normal.     Breath sounds: Normal breath sounds.  Abdominal:     General: Bowel sounds are normal.     Palpations: Abdomen is soft.     Comments: Tenderness palpation of the suprapubic region.  Negative CVA tenderness bilaterally.  Skin:    General: Skin is warm and dry.     Capillary Refill: Capillary refill takes less than 2 seconds.  Neurological:     Mental Status: She is alert and oriented to person, place, and time.  Psychiatric:        Mood and Affect: Mood normal.        Behavior: Behavior normal.    ED Results / Procedures / Treatments   Labs (all labs ordered are listed, but  only abnormal results are displayed) Labs Reviewed  URINALYSIS, ROUTINE W REFLEX MICROSCOPIC - Abnormal; Notable for the following components:      Result Value   APPearance CLOUDY (*)    Hgb urine dipstick LARGE (*)    All other components within normal limits  URINALYSIS, MICROSCOPIC (REFLEX) - Abnormal; Notable for the following components:   Bacteria, UA FEW (*)    All other components within normal limits  URINE CULTURE    EKG None  Radiology No results found.  Procedures Procedures   Medications Ordered in ED Medications - No data to display  ED Course  I have reviewed the triage vital signs and the nursing notes.  Pertinent labs & imaging results that were available during my care of the patient were reviewed by me and considered in my medical decision making (see chart for details).    MDM Rules/Calculators/A&P                         Patient recently treated for UTI x2.  Patient was recently treated a week ago with the same antibiotic that she received 2 months ago, nitrofurantoin, and did not experience resolution of her symptoms.  Patient still endorses urinary frequency, irritation, pressure in her vagina when she urinates.  Patient denies any vaginal discharge, vaginal bleeding, pain with intercourse.  Patient does not have CVA tenderness, is afebrile, no nausea, no vomiting.  Do not favor pyelonephritis at this time.  Do not favor nephrolithiasis at this time.  Clinically appears to be inadequately treated urinary tract infection, will prescribe 1 week of Keflex.  If symptoms do not resolve, encouraged to follow-up with primary care for further evaluation, rule out interstitial cystitis. Final Clinical Impression(s) / ED Diagnoses Final diagnoses:  Acute cystitis with hematuria  Rx / DC Orders ED Discharge Orders          Ordered    cephALEXin (KEFLEX) 500 MG capsule  2 times daily        06/26/21 1129             Trevionne Advani, [place] H,  PA-C 06/26/21 1144    Fredia Sorrow, MD 07/01/21 1516

## 2021-06-26 NOTE — ED Triage Notes (Signed)
Has had recent dx of UTI, was on abx x 5 days with some relief, last Tuesday began having signs and symptoms of UTI and rec new Rx for her UTI, still have same signs and symptoms, now having throbbing rt sided pain, no improvement with new Rx

## 2021-06-26 NOTE — Discharge Instructions (Signed)
It appears based on your symptoms and urine culture today that you have a recurrence of an inadequately treated urinary tract infection.  I do not see any signs of a kidney infection, or kidney stones based on her presentation today.  We will change her antibiotic.  Please follow-up with your primary care, you may need further evaluation to rule out another condition if your symptoms do not resolve.

## 2021-06-27 LAB — URINE CULTURE: Culture: NO GROWTH

## 2021-06-30 DIAGNOSIS — I83813 Varicose veins of bilateral lower extremities with pain: Secondary | ICD-10-CM | POA: Diagnosis not present

## 2021-06-30 DIAGNOSIS — I83892 Varicose veins of left lower extremities with other complications: Secondary | ICD-10-CM | POA: Diagnosis not present

## 2021-07-01 ENCOUNTER — Telehealth: Payer: Self-pay | Admitting: Family Medicine

## 2021-07-01 NOTE — Telephone Encounter (Signed)
Pt is wanting a call back concerning her most recent lab results.  She is also wondering if she is able to get a script in hand(if she needs it) for her frequent bladder infections cephALEXin (KEFLEX) 500 MG capsule [id number] when she is taking a trip on 10/13-10/21/22. Please advise pt at [phone].

## 2021-07-02 NOTE — Telephone Encounter (Signed)
Patient notified VIA phone and schedule an appt on 07/14/21 @ 4 pm.   Dm/cma

## 2021-07-06 DIAGNOSIS — I8311 Varicose veins of right lower extremity with inflammation: Secondary | ICD-10-CM | POA: Diagnosis not present

## 2021-07-06 DIAGNOSIS — I83893 Varicose veins of bilateral lower extremities with other complications: Secondary | ICD-10-CM | POA: Diagnosis not present

## 2021-07-06 DIAGNOSIS — I83813 Varicose veins of bilateral lower extremities with pain: Secondary | ICD-10-CM | POA: Diagnosis not present

## 2021-07-06 DIAGNOSIS — I8312 Varicose veins of left lower extremity with inflammation: Secondary | ICD-10-CM | POA: Diagnosis not present

## 2021-07-14 ENCOUNTER — Ambulatory Visit: Payer: Medicare PPO | Admitting: Family Medicine

## 2021-07-14 ENCOUNTER — Other Ambulatory Visit: Payer: Self-pay

## 2021-07-14 VITALS — BP 122/70 | HR 61 | Temp 97.9°F | Ht 64.0 in | Wt 138.0 lb

## 2021-07-14 DIAGNOSIS — R3129 Other microscopic hematuria: Secondary | ICD-10-CM

## 2021-07-14 DIAGNOSIS — N390 Urinary tract infection, site not specified: Secondary | ICD-10-CM

## 2021-07-14 DIAGNOSIS — R1031 Right lower quadrant pain: Secondary | ICD-10-CM

## 2021-07-14 MED ORDER — CEPHALEXIN 500 MG PO CAPS: 500.0000 mg | ORAL_CAPSULE | Freq: Four times a day (QID) | ORAL | 0 refills | Status: DC

## 2021-07-14 MED ORDER — CEPHALEXIN 500 MG PO CAPS: 500.0000 mg | ORAL_CAPSULE | Freq: Two times a day (BID) | ORAL | 0 refills | Status: DC

## 2021-07-14 NOTE — Progress Notes (Addendum)
[facility] [address] [place] [address] Dept: [phone] Dept Fax: [phone]  Office Visit  Subjective:    Patient ID: Nancy Cuevas, female    DOB: September 14, 1946, 75 y.o..   MRN: [id number]  Chief Complaint  Patient presents with   Follow-up    F/u UTI and discuss culture results/meds.      History of Present Illness:  Patient is in today for reassessment of her UTI symptoms. Nancy Cuevas had an episode in late June with typical UTI symptoms. Her Urine dip at the time showed 80 red blood cells/uL, nitrite positive and 3+ leukocytes. She was treated with a course of nitrofurantoin and had resolution of her symptoms. In early Sept. (9/1), she developed right lower back pain, chills, and hematuria. Her urine dip showed 2+ leukocytes, nitrite positive, and  200 red blood cells/uL. A full urinalysis showed nitrite positive, only 0-2 WBCs and TNTC RBCs. She was once again treated with nitrofurantoin, but her urine culture returned negative. She returned on 9/7 for a repeat UA. This now shoed nitrite neg., no leukocytes, and now only 10-20 RBC/hpf (no culture performed, as not indicated). Two days later, she had a sudden increase in low back pain and went to Urgent Care. Her UA was again nitrite negative with only 0-5 WBCs, but a few bacteria and > 50 RBCs/hpf. She was treated with Keflex. Again, the culture returned negative.  Nancy Cuevas is no longer having symptoms. 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  Past Medical History: Patient Active Problem List   Diagnosis Date Noted   Hematuria 06/18/2021   Ear problem, bilateral 09/05/2019   Atrial tachycardia ([place]) 09/04/2019   Stricture and stenosis of esophagus 09/04/2019   Osteopenia 09/04/2019   History of colonic polyps 03/22/2013   ECHOCARDIOGRAM, ABNORMAL 11/02/2010    Hyperlipidemia 10/27/2010   FEMALE STRESS INCONTINENCE 10/27/2010   HYPERPOTASSEMIA 10/14/2010   Disorder of bone and cartilage 03/24/2008   PALPITATIONS, OCCASIONAL 02/07/2008   Hypothyroidism 07/18/2007   Past Surgical History:  Procedure Laterality Date   ABDOMINAL HYSTERECTOMY  1988   prolapse uterus   CARPAL TUNNEL RELEASE  11/15/2011   Procedure: CARPAL TUNNEL RELEASE;  Surgeon: Tennis Must, MD;  Location: [facility];  Service: Orthopedics;  Laterality: Left;   CATARACT EXTRACTION  2011   COLONOSCOPY  2003,2014   normal,   COLONOSCOPY WITH PROPOFOL N/A 06/24/2017   Procedure: COLONOSCOPY WITH PROPOFOL;  Surgeon: Gatha Mayer, MD;  Location: [facility];  Service: Endoscopy;  Laterality: N/A;   FOOT SURGERY Right 10/2014   FRACTURE SURGERY  june 2006   left wrist   HAND SURGERY  oct 2012 and jan 2013   Dr Dean--[facility]   HOT HEMOSTASIS N/A 06/24/2017   Procedure: HOT HEMOSTASIS (ARGON PLASMA COAGULATION/BICAP);  Surgeon: Gatha Mayer, MD;  Location: [facility];  Service: Endoscopy;  Laterality: N/A;   left wrist hardware removal 10/12  10/12   natural birth  1962, 1972   PARTIAL PROCTECTOMY BY TEM  06/22/2013   Procedure: PARTIAL PROCTECTOMY BY TEM;  Surgeon: Leighton Ruff, MD;  Location: [facility];  Service: General;;   RETINAL DETACHMENT SURGERY  2009   left   Family History  Problem Relation Age of Onset   Diabetes Mother        1st degree relative  Hypertension Mother    Stroke Mother    Heart disease Father    Breast cancer Other        1st degree relative <50   Osteoporosis Other        1st degree relative   Cancer Maternal Aunt    Cancer Cousin    Heart disease Cousin    Alzheimer's disease Cousin    Colon cancer Neg Hx    Esophageal cancer Neg Hx    Stomach cancer Neg Hx    Rectal cancer Neg Hx    Outpatient Medications Prior to Visit  Medication Sig Dispense Refill   acetaminophen (TYLENOL) 500 MG tablet Take 500 mg by  mouth daily as needed for moderate pain or headache.     aspirin 81 MG tablet Take 81 mg by mouth every other day.     CALCIUM-MAGNESIUM-VITAMIN D PO Take 1 tablet by mouth daily.     desonide (DESOWEN) 0.05 % cream Apply topically 2 (two) times daily. 30 g 0   ferrous sulfate 325 (65 FE) MG tablet Take 325 mg by mouth daily.     Flaxseed, Linseed, (FLAX SEED OIL) 1000 MG CAPS Take 1,000 mg by mouth daily.      fluticasone (FLONASE) 50 MCG/ACT nasal spray Place 2 sprays into both nostrils daily. 16 g 1   Hypromellose (ARTIFICIAL TEARS OP) Apply 1 drop to eye daily as needed (dry eyes).     levothyroxine (SYNTHROID) 50 MCG tablet TAKE 1 TABLET BY MOUTH EVERY DAY 90 tablet 3   Multiple Vitamin (MULTIVITAMIN) tablet Take 1 tablet by mouth daily.     neomycin-bacitracin-polymyxin (NEOSPORIN) ointment Apply 1 application topically daily as needed for wound care.     Omega-3 Fatty Acids (FISH OIL) 1000 MG CAPS Take 1 capsule by mouth 2 (two) times daily.      omeprazole (PRILOSEC) 20 MG capsule TAKE 1 CAPSULE BY MOUTH DAILY 90 capsule 3   pravastatin (PRAVACHOL) 40 MG tablet TAKE 1 TABLET BY MOUTH EVERY DAY 90 tablet 3   cephALEXin (KEFLEX) 500 MG capsule Take 1 capsule (500 mg total) by mouth 2 (two) times daily. (Patient not taking: Reported on 07/14/2021) 14 capsule 0   nitrofurantoin, macrocrystal-monohydrate, (MACROBID) 100 MG capsule Take 1 capsule (100 mg total) by mouth 2 (two) times daily. (Patient not taking: Reported on 07/14/2021) 14 capsule 0   phenazopyridine (PYRIDIUM) 100 MG tablet Take 1 tablet (100 mg total) by mouth 3 (three) times daily as needed for pain. (Patient not taking: Reported on 07/14/2021) 6 tablet 0   No facility-administered medications prior to visit.   Allergies  Allergen Reactions   Sulfonamide Derivatives Rash   Objective:   Today's Vitals   07/14/21 1602  BP: 122/70  Pulse: 61  Temp: 97.9 F (36.6 C)  TempSrc: Temporal  SpO2: 95%  Weight: 138 lb (62.6  kg)  Height: 5\' 4"  (1.626 m)   Body mass index is 23.69 kg/m.   General: Well developed, well nourished. No acute distress. Psych: Alert and oriented. Normal mood and affect.  Health Maintenance Due  Topic Date Due   TETANUS/TDAP  02/06/2018   Zoster Vaccines- Shingrix (2 of 2) 10/01/2019   COLONOSCOPY (Pts 45-29yrs Insurance coverage will need to be confirmed)  06/24/2020   MAMMOGRAM  12/03/2020   COVID-19 Vaccine (4 - Booster for Pfizer series) 12/30/2020   Assessment & Plan:   1. Recurrent UTI 2. Other microscopic hematuria  I reviewed the UC note and the  urinalysis and urine culture results. I am willing to provide her with a course of Keflex, should [place] However, we did discuss that it is not clear that she had an actual UTI in light of her negative cultures. I would consider the possibility of genitourinary syndrome of menopause, interstitial cystitis, or other causes of hematuria. I recommend we start with a repeat UA. If hematuria without sign of infection persists, I would recommend referral to urology for work-up.  - Urinalysis w microscopic + reflex cultur - cephALEXin (KEFLEX) 500 MG capsule; Take 1 capsule (500 mg total) by mouth 2 (two) times daily. Take medication if symptoms of recurrent urinary tract infection develop.  Dispense: 14 capsule; Refill: 0  Haydee Salter, MD  ADDENDUM:  Component Ref Range & Units 1 d ago  (07/14/21) 2 wk ago  (06/26/21) 2 wk ago  (06/26/21) 3 wk ago  (06/24/21) 3 wk ago  (06/18/21) 3 wk ago  (06/18/21) 3 mo ago  (04/16/21)  Color, Urine YELLOW YELLOW   YELLOW  YELLOW  dark Red Abnormal  R     APPearance CLEAR CLEAR   CLOUDY Abnormal   CLEAR  Cloudy Abnormal  R   R   R   Specific Gravity, Urine 1.001 - 1.035 1.020   1.015 R  1.015  >=1.030 Abnormal  R     pH 5.0 - 8.0 5.5   6.5  5.5  6.5     Glucose, UA NEGATIVE NEGATIVE   NEGATIVE R  NEGATIVE      Bilirubin Urine NEGATIVE NEGATIVE   NEGATIVE  NEGATIVE   SMALL Abnormal  R     Ketones, ur NEGATIVE NEGATIVE   NEGATIVE R  NEGATIVE  TRACE Abnormal  R     Hgb urine dipstick NEGATIVE TRACE Abnormal    LARGE Abnormal   1+ Abnormal   LARGE Abnormal  R     Protein, ur NEGATIVE NEGATIVE   NEGATIVE R  NEGATIVE      Nitrites, Initial NEGATIVE NEGATIVE    NEGATIVE      Leukocyte Esterase NEGATIVE NEGATIVE    NEGATIVE      WBC, UA 0 - 5 /HPF 0-5  0-5 R   NONE SEEN  0-2/hpf R     RBC / HPF 0 - 2 /HPF 0-2  >50 R   10-20 Abnormal   TNTC(>50/hpf) Abnormal  R     Squamous Epithelial / LPF < OR = 5 /HPF NONE SEEN  0-5 R, CM   NONE SEEN  Rare(0-4/hpf) R     Bacteria, UA NONE SEEN /HPF NONE SEEN  FEW Abnormal  R   NONE SEEN      Calcium Oxalate Crystal NONE OR FEW /HPF FEW         Hyaline Cast NONE SEEN /LPF NONE SEEN    NONE SEEN        The urine no longer shows significant red blood cells or any current sign of infection. Of interest, they did see calcium oxalate crystals under the microscope. It is possible that Nancy Cuevas's pain over the past month may have been passing a stone. I think it would be reasonable to perform a CT scan to see if there is any sign of any stones in the urinary tract. If any are found, it could confirm what was happening a few weeks ago and could let us know what we might need to address moving forward.  3. Right lower quadrant abdominal pain  - CT RENAL STONE STUDY; Future  Haydee Salter, MD

## 2021-07-15 LAB — URINALYSIS W MICROSCOPIC + REFLEX CULTURE
Bacteria, UA: NONE SEEN /HPF
Bilirubin Urine: NEGATIVE
Glucose, UA: NEGATIVE
Hyaline Cast: NONE SEEN /LPF
Ketones, ur: NEGATIVE
Leukocyte Esterase: NEGATIVE
Nitrites, Initial: NEGATIVE
Protein, ur: NEGATIVE
Specific Gravity, Urine: 1.02 (ref 1.001–1.035)
Squamous Epithelial / HPF: NONE SEEN /HPF (ref <=5)
pH: 5.5 (ref 5.0–8.0)

## 2021-07-15 LAB — NO CULTURE INDICATED

## 2021-07-15 NOTE — Addendum Note (Signed)
Addended by: Haydee Salter on: 07/15/2021 09:13 AM   Modules accepted: Orders, Level of Service

## 2021-07-16 ENCOUNTER — Telehealth (INDEPENDENT_AMBULATORY_CARE_PROVIDER_SITE_OTHER): Payer: Medicare PPO | Admitting: Family Medicine

## 2021-07-16 ENCOUNTER — Encounter: Payer: Self-pay | Admitting: Family Medicine

## 2021-07-16 DIAGNOSIS — U071 COVID-19: Secondary | ICD-10-CM

## 2021-07-16 MED ORDER — NIRMATRELVIR/RITONAVIR (PAXLOVID)TABLET: 3.0000 | ORAL_TABLET | Freq: Two times a day (BID) | ORAL | 0 refills | Status: AC

## 2021-07-16 NOTE — Progress Notes (Signed)
Virtual Visit via Video Note  I connected with Nancy Cuevas  on 07/16/21 at  4:00 PM EDT by a video enabled telemedicine application and verified that I am speaking with the correct person using two identifiers.  Location patient: home, [place] Location provider:work or home office Persons participating in the virtual visit: patient, provider  I discussed the limitations of evaluation and management by telemedicine and the availability of in person appointments. The patient expressed understanding and agreed to proceed.   HPI:  Acute telemedicine visit for : -Onset: 1 day ago and tested positive for covid today -Symptoms include: cough, chills, poor appetite, body aches -Denies:fever, CP, SOB, NVD, inability to eat/drink/get out of bed -Pertinent past medical history: see below -Pertinent medication allergies:  Allergies  Allergen Reactions   Sulfonamide Derivatives Rash  -COVID-19 vaccine status: 2 doses + 1 booster -GFR 73 5/22  ROS: See pertinent positives and negatives per HPI.  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 Holter (1/12): short runs of atrial tachycardia versus MAT. ;  Echo (14) EF 55-60%, normal wall motion, mild MR, mild LAE, atrial septal aneurysm   Personal history of colonic and rectal adenomas 03/22/2013   POSTMENOPAUSAL STATUS 02/07/2008   Symptomatic menopausal or female climacteric states     Past Surgical  History:  Procedure Laterality Date   ABDOMINAL HYSTERECTOMY  1988   prolapse uterus   CARPAL TUNNEL RELEASE  11/15/2011   Procedure: CARPAL TUNNEL RELEASE;  Surgeon: Tennis Must, MD;  Location: [facility];  Service: Orthopedics;  Laterality: Left;   CATARACT EXTRACTION  2011   COLONOSCOPY  2003,2014   normal,   COLONOSCOPY WITH PROPOFOL N/A 06/24/2017   Procedure: COLONOSCOPY WITH PROPOFOL;  Surgeon: Gatha Mayer, MD;  Location: [facility];  Service: Endoscopy;  Laterality: N/A;   FOOT SURGERY Right 10/2014   FRACTURE SURGERY  june 2006   left wrist   HAND SURGERY  oct 2012 and jan 2013   Dr Dean--[facility]   HOT HEMOSTASIS N/A 06/24/2017   Procedure: HOT HEMOSTASIS (ARGON PLASMA COAGULATION/BICAP);  Surgeon: Gatha Mayer, MD;  Location: [facility];  Service: Endoscopy;  Laterality: N/A;   left wrist hardware removal 10/12  10/12   natural birth  1962, 1972   PARTIAL PROCTECTOMY BY TEM  06/22/2013   Procedure: PARTIAL PROCTECTOMY BY TEM;  Surgeon: Leighton Ruff, MD;  Location: [facility];  Service: General;;   RETINAL DETACHMENT SURGERY  2009   left     Current Outpatient Medications:    acetaminophen (TYLENOL) 500 MG tablet, Take 500 mg by mouth daily as needed for moderate pain or headache., Disp: , Rfl:    aspirin 81 MG tablet, Take 81 mg by mouth every other day., Disp: , Rfl:    CALCIUM-MAGNESIUM-VITAMIN D PO, Take 1 tablet by mouth daily., Disp: , Rfl:    cephALEXin (KEFLEX) 500 MG capsule, Take 1 capsule (500  mg total) by mouth 2 (two) times daily. Take medication if symptoms of recurrent urinary tract infection develop., Disp: 14 capsule, Rfl: 0   desonide (DESOWEN) 0.05 % cream, Apply topically 2 (two) times daily., Disp: 30 g, Rfl: 0   ferrous sulfate 325 (65 FE) MG tablet, Take 325 mg by mouth daily., Disp: , Rfl:    Flaxseed, Linseed, (FLAX SEED OIL) 1000 MG CAPS, Take 1,000 mg by mouth daily. , Disp: , Rfl:    fluticasone (FLONASE) 50 MCG/ACT  nasal spray, Place 2 sprays into both nostrils daily., Disp: 16 g, Rfl: 1   Hypromellose (ARTIFICIAL TEARS OP), Apply 1 drop to eye daily as needed (dry eyes)., Disp: , Rfl:    levothyroxine (SYNTHROID) 50 MCG tablet, TAKE 1 TABLET BY MOUTH EVERY DAY, Disp: 90 tablet, Rfl: 3   Multiple Vitamin (MULTIVITAMIN) tablet, Take 1 tablet by mouth daily., Disp: , Rfl:    Omega-3 Fatty Acids (FISH OIL) 1000 MG CAPS, Take 1 capsule by mouth 2 (two) times daily. , Disp: , Rfl:    omeprazole (PRILOSEC) 20 MG capsule, TAKE 1 CAPSULE BY MOUTH DAILY, Disp: 90 capsule, Rfl: 3   pravastatin (PRAVACHOL) 40 MG tablet, TAKE 1 TABLET BY MOUTH EVERY DAY, Disp: 90 tablet, Rfl: 3  EXAM:  VITALS per patient if applicable:  GENERAL: alert, oriented, appears well and in no acute distress  HEENT: atraumatic, conjunttiva clear, no obvious abnormalities on inspection of external nose and ears  NECK: normal movements of the head and neck  LUNGS: on inspection no signs of respiratory distress, breathing rate appears normal, no obvious gross SOB, gasping or wheezing  CV: no obvious cyanosis  MS: moves all visible extremities without noticeable abnormality  PSYCH/NEURO: pleasant and cooperative, no obvious depression or anxiety, speech and thought processing grossly intact  ASSESSMENT AND PLAN:  Discussed the following assessment and plan:  COVID-19   Discussed treatment options (infusions and oral options and risk of drug interactions), ideal treatment window, potential complications, isolation and precautions for COVID-19.  Discussed possibility of rebound with antivirals and the need to reisolate if it should occur for 5 days. Checked for/reviewed any labs done in the last 90 days with GFR listed in HPI if available. After lengthy discussion, the patient opted for possible treatment with Paxlovid due to being higher risk for complications of covid or severe disease and other factors. She wants to hold off for a day  and if only mild symptoms not take and if any worsening start the Paxlovid.  Discussed EUA status of this drug and the fact that there is preliminary limited knowledge of risks/interactions/side effects per EUA document vs possible benefits and precautions. This information was shared with patient during the visit and also was provided in patient instructions. Also, advised that patient discuss risks/interactions and use with pharmacist/treatment team as well.   Other symptomatic care measures summarized in patient instructions. Advised to seek prompt in person care if worsening, new symptoms arise, or if is not improving with treatment. Discussed options for inperson care if PCP office not available. Did let this patient know that I only do telemedicine on Tuesdays and Thursdays for [place]. Advised to schedule follow up visit with PCP or [facility] if any further questions or concerns to avoid delays in care.   I discussed the assessment and treatment plan with the patient. The patient was provided an opportunity to ask questions and all were answered. The patient agreed with the plan and demonstrated an understanding  of the instructions.     Lucretia Kern, DO

## 2021-07-16 NOTE — Patient Instructions (Signed)
HOME CARE TIPS:  -I sent the medication(s) we discussed to your pharmacy: Meds ordered this encounter  Medications   nirmatrelvir/ritonavir EUA (PAXLOVID) 20 x 150 MG & 10 x 100MG TABS    Sig: Take 3 tablets by mouth 2 (two) times daily for 5 days. (Take nirmatrelvir 150 mg two tablets twice daily for 5 days and ritonavir 100 mg one tablet twice daily for 5 days) Patient GFR is > 60 in may    Dispense:  30 tablet    Refill:  0     -I sent in the [place] treatment or referral you requested per our discussion. Please see the information provided below and discuss further with the pharmacist/treatment team.  -If taking Paxlovid, please review all medications, supplement and over the counter drugs with your pharmacist and ask them to check for any interactions. Please make the following changes to your regular medications while taking Paxlovid: *STOP your pravastatin (cholesterol medication) during the Covid treatment and restart 3 days after you finish treatment  -If taking Paxlovid, there is a chance of rebound illness after finishing your treatment. If you become sick again please isolate for an additional 5 days.    -can use tylenol if needed for fevers, aches and pains per instructions  -can use nasal saline a few times per day if you have nasal congestion  -stay hydrated, drink plenty of fluids and eat small healthy meals - avoid dairy  -follow up with your doctor in 2-3 days unless improving and feeling better  -stay home while sick, except to seek medical care. If you have COVID19, ideally it would be best to stay home for a full 10 days since the onset of symptoms PLUS one day of no fever and feeling better. Wear a good mask that fits snugly (such as N95 or KN95) if around others to reduce the risk of transmission.  It was nice to meet you today, and I really hope you are feeling better soon. I help [facility] out with telemedicine visits on Tuesdays and Thursdays and am available  for visits on those days. If you have any concerns or questions following this visit please schedule a follow up visit with your Primary Care doctor or seek care at a local urgent care clinic to avoid delays in care.     Fact Sheet for Patients And Caregivers Emergency Use Authorization (EUA) Of LAGEVRIOT (molnupiravir) capsules For Coronavirus Disease 2019 (COVID-19)  What is the most important information I should know about LAGEVRIO? LAGEVRIO may cause serious side effects, including: ? LAGEVRIO may cause harm to your unborn baby. It is not known if LAGEVRIO will harm your baby if you take LAGEVRIO during pregnancy. o LAGEVRIO is not recommended for use in pregnancy. o LAGEVRIO has not been studied in pregnancy. LAGEVRIO was studied in pregnant animals only. When LAGEVRIO was given to pregnant animals, LAGEVRIO caused harm to their unborn babies. o You and your healthcare provider may decide that you should take LAGEVRIO during pregnancy if there are no other COVID-19 treatment options approved or authorized by the FDA that are accessible or clinically appropriate for you. o If you and your healthcare provider decide that you should take LAGEVRIO during pregnancy, you and your healthcare provider should discuss the known and potential benefits and the potential risks of taking LAGEVRIO during pregnancy. For individuals who are able to become pregnant: ? You should use a reliable method of birth control (contraception) consistently and correctly during treatment with LAGEVRIO and  for 4 days after the last dose of LAGEVRIO. Talk to your healthcare provider about reliable birth control methods. ? Before starting treatment with [facility] your healthcare provider may do a pregnancy test to see if you are pregnant before starting treatment with LAGEVRIO. ? Tell your healthcare provider right away if you become pregnant or think you may be pregnant during treatment with LAGEVRIO. Pregnancy  Surveillance Program: ? There is a pregnancy surveillance program for individuals who take LAGEVRIO during pregnancy. The purpose of this program is to collect information about the health of you and your baby. Talk to your healthcare provider about how to take part in this program. ? If you take LAGEVRIO during pregnancy and you agree to participate in the pregnancy surveillance program and allow your healthcare provider to share your information with [place], then your healthcare provider will report your use of [place] during pregnancy to [place]. by calling [phone] or PeacefulBlog.es. For individuals who are sexually active with partners who are able to become pregnant: ? It is not known if LAGEVRIO can affect sperm. While the risk is regarded as low, animal studies to fully assess the potential for LAGEVRIO to affect the babies of males treated with LAGEVRIO have not been completed. A reliable method of birth control (contraception) should be used consistently and correctly during treatment with LAGEVRIO and for at least 3 months after the last dose. The risk to sperm beyond 3 months is not known. Studies to understand the risk to sperm beyond 3 months are ongoing. Talk to your healthcare provider about reliable birth control methods. Talk to your healthcare provider if you have questions or concerns about how LAGEVRIO may affect sperm. You are being given this fact sheet because your healthcare provider believes it is necessary to provide you with LAGEVRIO for the treatment of adults with mild-to-moderate coronavirus disease 2019 (COVID-19) with positive results of direct SARS-CoV-2 viral testing, and who are at high risk for progression to severe COVID-19 including hospitalization or death, and for whom other COVID-19 treatment options approved or authorized by the FDA are not accessible or clinically appropriate. The U.S. Food and Drug  Administration (FDA) has issued an Emergency Use Authorization (EUA) to make LAGEVRIO available during the COVID-19 pandemic (for more details about an EUA please see "What is an Emergency Use Authorization?" at the end of this document). LAGEVRIO is not an FDA-approved medicine in the [place]. Read this Fact Sheet for information about LAGEVRIO. Talk to your healthcare provider about your options if you have any questions. It is your choice to take LAGEVRIO.  What is COVID-19? COVID-19 is caused by a virus called a coronavirus. You can get COVID-19 through close contact with another person who has the virus. COVID-19 illnesses have ranged from very mild-to-severe, including illness resulting in death. While information so far suggests that most COVID-19 illness is mild, serious illness can happen and may cause some of your other medical conditions to become worse. Older people and people of all ages with severe, long lasting (chronic) medical conditions like heart disease, lung disease and diabetes, for example seem to be at higher risk of being hospitalized for COVID-19.  What is LAGEVRIO? LAGEVRIO is an investigational medicine used to treat mild-to-moderate COVID-19 in adults: ? with positive results of direct SARS-CoV-2 viral testing, and ? who are at high risk for progression to severe COVID-19 including hospitalization or death, and for whom other COVID-19 treatment options approved or  authorized by the FDA are not accessible or clinically appropriate. The FDA has authorized the emergency use of LAGEVRIO for the treatment of mild-tomoderate COVID-19 in adults under an EUA. For more information on EUA, see the "What is an Emergency Use Authorization (EUA)?" section at the end of this Fact Sheet. LAGEVRIO is not authorized: ? for use in people less than 76 years of age. ? for prevention of COVID-19. ? for people needing hospitalization for COVID-19. ? for use for longer than 5  consecutive days.  What should I tell my healthcare provider before I take LAGEVRIO? Tell your healthcare provider if you: ? Have any allergies ? Are breastfeeding or plan to breastfeed ? Have any serious illnesses ? Are taking any medicines (prescription, over-the-counter, vitamins, or herbal products).  How do I take LAGEVRIO? ? Take LAGEVRIO exactly as your healthcare provider tells you to take it. ? Take 4 capsules of LAGEVRIO every 12 hours (for example, at 8 am and at 8 pm) ? Take LAGEVRIO for 5 days. It is important that you complete the full 5 days of treatment with LAGEVRIO. Do not stop taking LAGEVRIO before you complete the full 5 days of treatment, even if you feel better. ? Take LAGEVRIO with or without food. ? You should stay in isolation for as long as your healthcare provider tells you to. Talk to your healthcare provider if you are not sure about how to properly isolate while you have COVID-19. ? Swallow LAGEVRIO capsules whole. Do not open, break, or crush the capsules. If you cannot swallow capsules whole, tell your healthcare provider. ? What to do if you miss a dose: o If it has been less than 10 hours since the missed dose, take it as soon as you remember o If it has been more than 10 hours since the missed dose, skip the missed dose and take your dose at the next scheduled time. ? Do not double the dose of LAGEVRIO to make up for a missed dose.  What are the important possible side effects of LAGEVRIO? ? See, "What is the most important information I should know about LAGEVRIO?" ? Allergic Reactions. Allergic reactions can happen in people taking LAGEVRIO, even after only 1 dose. Stop taking LAGEVRIO and call your healthcare provider right away if you get any of the following symptoms of an allergic reaction: o hives o rapid heartbeat o trouble swallowing or breathing o swelling of the mouth, lips, or face o throat tightness o hoarseness o skin rash The  most common side effects of LAGEVRIO are: ? diarrhea ? nausea ? dizziness These are not all the possible side effects of LAGEVRIO. Not many people have taken LAGEVRIO. Serious and unexpected side effects may happen. This medicine is still being studied, so it is possible that all of the risks are not known at this time.  What other treatment choices are there?  Veklury (remdesivir) is FDA-approved as an intravenous (IV) infusion for the treatment of mildto-moderate IRJJO-84 in certain adults and children. Talk with your doctor to see if Marijean Heath is appropriate for you. Like LAGEVRIO, FDA may also allow for the emergency use of other medicines to treat people with COVID-19. Go to LacrosseProperties.si for more information. It is your choice to be treated or not to be treated with LAGEVRIO. Should you decide not to take it, it will not change your standard medical care.  What if I am breastfeeding? Breastfeeding is not recommended during treatment with LAGEVRIO and for  4 days after the last dose of LAGEVRIO. If you are breastfeeding or plan to breastfeed, talk to your healthcare provider about your options and specific situation before taking LAGEVRIO.  How do I report side effects with LAGEVRIO? Contact your healthcare provider if you have any side effects that bother you or do not go away. Report side effects to FDA MedWatch at SmoothHits.hu or call 1-800-FDA-1088 (1- 331-101-5026).  How should I store [place]? ? Store LAGEVRIO capsules at room temperature between 6F to 29F (20C to 25C). ? Keep LAGEVRIO and all medicines out of the reach of children and pets. How can I learn more about COVID-19? ? Ask your healthcare provider. ? Visit SeekRooms.co.uk ? Contact your local or state public health department. ? Call [place] at [phone] (toll free in the  U.S.) ? Visit www.molnupiravir.com  What Is an Emergency Use Authorization (EUA)? The [place] FDA has made [place] available under an emergency access mechanism called an Emergency Use Authorization (EUA) The EUA is supported by a Presenter, broadcasting Health and Human Service (HHS) declaration that circumstances exist to justify emergency use of drugs and biological products during the COVID-19 pandemic. LAGEVRIO for the treatment of mild-to-moderate COVID-19 in adults with positive results of direct SARS-CoV-2 viral testing, who are at high risk for progression to severe COVID-19, including hospitalization or death, and for whom alternative COVID-19 treatment options approved or authorized by FDA are not accessible or clinically appropriate, has not undergone the same type of review as an FDA-approved product. In issuing an EUA under the CNOBS-96 public health emergency, the FDA has determined, among other things, that based on the total amount of scientific evidence available including data from adequate and well-controlled clinical trials, if available, it is reasonable to believe that the product may be effective for diagnosing, treating, or preventing COVID-19, or a serious or life-threatening disease or condition caused by COVID-19; that the known and potential benefits of the product, when used to diagnose, treat, or prevent such disease or condition, outweigh the known and potential risks of such product; and that there are no adequate, approved, and available alternatives.  All of these criteria must be met to allow for the product to be used in the treatment of patients during the COVID-19 pandemic. The EUA for LAGEVRIO is in effect for the duration of the COVID-19 declaration justifying emergency use of LAGEVRIO, unless terminated or revoked (after which LAGEVRIO may no longer be used under the EUA). For patent information: http://rogers.info/ Copyright  2021-2022 [place], [place] and its affiliates. All rights reserved. [id number] Revised: March 2022   Seek in person care or schedule a follow up video visit promptly if your symptoms worsen, new concerns arise or you are not improving with treatment. Call 911 and/or seek emergency care if your symptoms are severe or life threatening.

## 2021-07-17 ENCOUNTER — Telehealth: Payer: Self-pay | Admitting: Family Medicine

## 2021-07-17 NOTE — Telephone Encounter (Signed)
We sent her nirmatrelvir/ritonavir EUA (PAXLOVID) 20 x 150 MG & 10 x 100MG  TABS [[id number]] to [facility] on [address], she would like [place] to send it to [facility] on [address]

## 2021-07-17 NOTE — Telephone Encounter (Signed)
Lft VM to have her ask the pharmacy to transfer medication to the other store. Dm/cma

## 2021-08-05 ENCOUNTER — Encounter: Payer: Medicare PPO | Admitting: Family Medicine

## 2021-08-11 ENCOUNTER — Other Ambulatory Visit: Payer: Self-pay

## 2021-08-11 ENCOUNTER — Ambulatory Visit
Admission: RE | Admit: 2021-08-11 | Discharge: 2021-08-11 | Disposition: A | Discharge status: Home / Self Care | Payer: Medicare PPO | Source: Ambulatory Visit | Attending: Family Medicine | Admitting: Family Medicine

## 2021-08-11 DIAGNOSIS — R319 Hematuria, unspecified: Secondary | ICD-10-CM | POA: Diagnosis not present

## 2021-08-11 DIAGNOSIS — K7689 Other specified diseases of liver: Secondary | ICD-10-CM | POA: Diagnosis not present

## 2021-08-11 DIAGNOSIS — K449 Diaphragmatic hernia without obstruction or gangrene: Secondary | ICD-10-CM | POA: Diagnosis not present

## 2021-08-11 DIAGNOSIS — R1031 Right lower quadrant pain: Secondary | ICD-10-CM

## 2021-08-11 DIAGNOSIS — M47816 Spondylosis without myelopathy or radiculopathy, lumbar region: Secondary | ICD-10-CM | POA: Diagnosis not present

## 2021-08-12 ENCOUNTER — Encounter: Payer: Self-pay | Admitting: Family Medicine

## 2021-08-12 DIAGNOSIS — I700 Atherosclerosis of aorta: Secondary | ICD-10-CM | POA: Insufficient documentation

## 2021-08-12 DIAGNOSIS — K449 Diaphragmatic hernia without obstruction or gangrene: Secondary | ICD-10-CM | POA: Insufficient documentation

## 2021-08-21 ENCOUNTER — Encounter: Payer: Self-pay | Admitting: Family Medicine

## 2021-08-21 DIAGNOSIS — I8393 Asymptomatic varicose veins of bilateral lower extremities: Secondary | ICD-10-CM | POA: Insufficient documentation

## 2021-09-29 ENCOUNTER — Other Ambulatory Visit: Payer: Self-pay

## 2021-09-29 ENCOUNTER — Ambulatory Visit: Payer: Medicare PPO | Admitting: Family Medicine

## 2021-09-29 ENCOUNTER — Ambulatory Visit (INDEPENDENT_AMBULATORY_CARE_PROVIDER_SITE_OTHER): Payer: Medicare PPO

## 2021-09-29 ENCOUNTER — Encounter: Payer: Self-pay | Admitting: Family Medicine

## 2021-09-29 VITALS — BP 118/66 | HR 56 | Temp 95.3°F | Ht 64.0 in | Wt 137.8 lb

## 2021-09-29 DIAGNOSIS — Z86010 Personal history of colonic polyps: Secondary | ICD-10-CM | POA: Diagnosis not present

## 2021-09-29 DIAGNOSIS — M79672 Pain in left foot: Secondary | ICD-10-CM | POA: Diagnosis not present

## 2021-09-29 DIAGNOSIS — R790 Abnormal level of blood mineral: Secondary | ICD-10-CM | POA: Insufficient documentation

## 2021-09-29 DIAGNOSIS — J309 Allergic rhinitis, unspecified: Secondary | ICD-10-CM | POA: Insufficient documentation

## 2021-09-29 DIAGNOSIS — E039 Hypothyroidism, unspecified: Secondary | ICD-10-CM | POA: Diagnosis not present

## 2021-09-29 DIAGNOSIS — Z1231 Encounter for screening mammogram for malignant neoplasm of breast: Secondary | ICD-10-CM

## 2021-09-29 DIAGNOSIS — E875 Hyperkalemia: Secondary | ICD-10-CM | POA: Diagnosis not present

## 2021-09-29 DIAGNOSIS — E785 Hyperlipidemia, unspecified: Secondary | ICD-10-CM | POA: Diagnosis not present

## 2021-09-29 DIAGNOSIS — M7989 Other specified soft tissue disorders: Secondary | ICD-10-CM | POA: Diagnosis not present

## 2021-09-29 DIAGNOSIS — M85852 Other specified disorders of bone density and structure, left thigh: Secondary | ICD-10-CM

## 2021-09-29 NOTE — Progress Notes (Signed)
[facility] [address] [place] [address] Dept: [phone] Dept Fax: [phone]  Transfer of Care Office Visit  Subjective:    Patient ID: Nancy Cuevas, female    DOB: 05-Mar-1946, 75 y.o..   MRN: [id number]  Chief Complaint  Patient presents with   Transitions Of Care    Est care. Pt c/o swelling in left foot.     History of Present Illness:  Patient is in today to establish care. Nancy Cuevas was born in [place]. She attended [employer] (2 years) and then [employer], Chemical engineer in education. She and her husband moved to [facility] in 1970, after he got out of the service. They lived there for 33 years. They moved back to [place] in 2005. 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 She has two children (son- 35, daughter 12). She denies tobacco or drug use. She drinks wine occasionally.  Nancy Cuevas has a history of osteopenia. She is on a calcium/magnesium/Vitamin D supplement.  Nancy Cuevas has a history of iron deficiency. She takes a daily iron supplement.  Nancy Cuevas has a history of allergic rhinitis. She uses Flonase nasal spray to manage her symptoms.  Nancy Cuevas has a history of hypothyroidism. She is on levothyroxine for thyroid replacement.  Nancy Cuevas has a history of acid reflux. She has had to previously have an esophageal dilation due to stricture. She is managed on omeprazole.  Nancy Cuevas has a history of hyperlipidemia. She is on pravastatin and takes a fish oil supplement.  Nancy Cuevas notes she has been having trouble with swelling and pain in her left mid forefoot. She denies any known trauma. She has tried putting ice on this. She was treated earlier this year for plantar fasciitis and has had prior bunion surgery on her right foot.  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   Hypothyroidism 07/18/2007   Past Surgical History:  Procedure Laterality Date   ABDOMINAL HYSTERECTOMY  1988   prolapse uterus   BUNIONECTOMY Right 10/2014   CARPAL TUNNEL RELEASE  11/15/2011   Procedure: CARPAL TUNNEL RELEASE;  Surgeon: Tennis Must, MD;  Location: [facility];  Service: Orthopedics;  Laterality: Left;   CATARACT EXTRACTION Left 2011   COLONOSCOPY  2003,2014   normal,   COLONOSCOPY WITH PROPOFOL N/A 06/24/2017   Procedure: COLONOSCOPY WITH PROPOFOL;  Surgeon: Gatha Mayer, MD;  Location: [facility];  Service: Endoscopy;  Laterality: N/A;   HAND SURGERY  oct 2012 and jan 2013   Dr Dean--[facility]   HOT HEMOSTASIS N/A 06/24/2017   Procedure: HOT HEMOSTASIS (ARGON PLASMA COAGULATION/BICAP);  Surgeon: Gatha Mayer, MD;  Location: [facility];  Service: Endoscopy;  Laterality: N/A;   left wrist hardware removal 10/12  07/2011   OPEN REDUCTION INTERNAL FIXATION (ORIF) DISTAL RADIAL FRACTURE Left 03/2005   left wrist   PARTIAL PROCTECTOMY BY TEM  06/22/2013   Procedure: PARTIAL PROCTECTOMY BY TEM;  Surgeon: Leighton Ruff, MD;  Location:  [facility];  Service: General;;   RETINAL DETACHMENT SURGERY  2009   left   Family History  Problem Relation Age of Onset   Diabetes Mother        1st degree relative   Hypertension Mother    Stroke Mother    Heart disease Father    Cancer Maternal Aunt    Heart disease Maternal Uncle    ALS Paternal Aunt    Diabetes Paternal Uncle    Heart disease Maternal Grandfather    Heart disease Paternal  Grandfather    Cancer Cousin    Heart disease Cousin    Alzheimer's disease Cousin    Breast cancer Other        1st degree relative <50   Osteoporosis Other        1st degree relative   Colon cancer Neg Hx    Esophageal cancer Neg Hx    Stomach cancer Neg Hx    Rectal cancer Neg Hx    Outpatient Medications Prior to Visit  Medication Sig Dispense Refill   acetaminophen (TYLENOL) 500 MG tablet Take 500 mg by mouth daily as needed for moderate pain or headache.     aspirin 81 MG tablet Take 81 mg by mouth every other day.     CALCIUM-MAGNESIUM-VITAMIN D PO Take 1 tablet by mouth daily.     desonide (DESOWEN) 0.05 % cream Apply topically 2 (two) times daily. 30 g 0   ferrous sulfate 325 (65 FE) MG tablet Take 325 mg by mouth daily.     Flaxseed, Linseed, (FLAX SEED OIL) 1000 MG CAPS Take 1,000 mg by mouth daily.      fluticasone (FLONASE) 50 MCG/ACT nasal spray Place 2 sprays into both nostrils daily. 16 g 1   Hypromellose (ARTIFICIAL TEARS OP) Apply 1 drop to eye daily as needed (dry eyes).     levothyroxine (SYNTHROID) 50 MCG tablet TAKE 1 TABLET BY MOUTH EVERY DAY 90 tablet 3   Multiple Vitamin (MULTIVITAMIN) tablet Take 1 tablet by mouth daily.     Omega-3 Fatty Acids (FISH OIL) 1000 MG CAPS Take 1 capsule by mouth 2 (two) times daily.      omeprazole (PRILOSEC) 20 MG capsule TAKE 1 CAPSULE BY MOUTH DAILY 90 capsule 3   pravastatin (PRAVACHOL) 40 MG tablet TAKE 1 TABLET BY MOUTH EVERY DAY 90 tablet 3   cephALEXin (KEFLEX) 500 MG capsule Take 1 capsule (500 mg total) by mouth 2 (two) times daily. Take medication if symptoms of recurrent urinary tract infection develop. 14 capsule 0   No facility-administered medications prior to visit.   Allergies  Allergen Reactions   Sulfonamide Derivatives Rash   Objective:   Today's Vitals   09/29/21 0903  BP: 118/66  Pulse: (!) 56  Temp: (!) 95.3 F (35.2 C)  TempSrc: Temporal  SpO2: 99%  Weight: 137 lb 12.8 oz (62.5 kg)  Height:  5\' 4"  (1.626 m)   Body mass index is 23.65 kg/m.   General: Well developed, well nourished. No acute distress. Extremities: The left foot does have an area of swelling and mild redness int he mid forefoot. There is mild   tenderness to palpation of this area. Psych: Alert and oriented. Normal mood and affect.  Health Maintenance Due  Topic Date Due   TETANUS/TDAP  02/06/2018   Zoster Vaccines- Shingrix (2 of 2) 10/01/2019   COVID-19 Vaccine (4 - Booster for Pfizer series) 10/27/2020     Imaging: Left Foot- No fracture  or dislocation.  Assessment & Plan:   1. Left foot pain It is unclear what the etiology of the swelling and redness of the dorsum of the forefoot may be. This might represent an intermetatarsal bursitis. I will refer her to podiatry for assessment.  - DG Foot Complete Left - Ambulatory referral to Podiatry  2. Hypothyroidism, unspecified type Due for a recheck on TSH. Continue levothyroxine.  - TSH; Future  3. Osteopenia of neck of left femur Due for follow-up bone density. Continue calcium + Vit. D.  - DG Bone Density; Future  4. Hyperlipidemia, unspecified hyperlipidemia type Due to reassess lipids. Continue pravastatin and Fish oil.  - Lipid panel; Future  5. Hyperpotassemia Past history of low potassium. I will assess if this remains an issue.  - Basic metabolic panel; Future  6. History of colonic polyps Nancy Cuevas remains in good health. I feel it would be appropriate for her to continue cancer screening.   - Ambulatory referral to Gastroenterology  7. Low iron stores History of low iron stores. I will check CBC to see if there is any evidence of anemia.  - CBC; Future  8. Encounter for screening mammogram for malignant neoplasm of breast  - MM DIGITAL SCREENING BILATERAL; Future  Haydee Salter, MD

## 2021-10-14 DIAGNOSIS — R600 Localized edema: Secondary | ICD-10-CM | POA: Diagnosis not present

## 2021-10-14 DIAGNOSIS — S92335A Nondisplaced fracture of third metatarsal bone, left foot, initial encounter for closed fracture: Secondary | ICD-10-CM | POA: Diagnosis not present

## 2021-10-14 DIAGNOSIS — R262 Difficulty in walking, not elsewhere classified: Secondary | ICD-10-CM | POA: Diagnosis not present

## 2021-10-16 ENCOUNTER — Other Ambulatory Visit (INDEPENDENT_AMBULATORY_CARE_PROVIDER_SITE_OTHER): Payer: Medicare PPO

## 2021-10-16 ENCOUNTER — Other Ambulatory Visit: Payer: Self-pay

## 2021-10-16 DIAGNOSIS — E039 Hypothyroidism, unspecified: Secondary | ICD-10-CM | POA: Diagnosis not present

## 2021-10-16 DIAGNOSIS — E875 Hyperkalemia: Secondary | ICD-10-CM

## 2021-10-16 DIAGNOSIS — E785 Hyperlipidemia, unspecified: Secondary | ICD-10-CM

## 2021-10-16 DIAGNOSIS — R790 Abnormal level of blood mineral: Secondary | ICD-10-CM | POA: Diagnosis not present

## 2021-10-16 LAB — LIPID PANEL
Cholesterol: 203 mg/dL — ABNORMAL HIGH (ref 0–200)
HDL: 79 mg/dL (ref >39.00)
LDL Cholesterol: 104 mg/dL — ABNORMAL HIGH (ref 0–99)
NonHDL: 124.17
Total CHOL/HDL Ratio: 3
Triglycerides: 99 mg/dL (ref 0.0–149.0)
VLDL: 19.8 mg/dL (ref 0.0–40.0)

## 2021-10-16 LAB — CBC
HCT: 36.8 % (ref 36.0–46.0)
Hemoglobin: 12.4 g/dL (ref 12.0–15.0)
MCHC: 33.7 g/dL (ref 30.0–36.0)
MCV: 94.7 fl (ref 78.0–100.0)
Platelets: 269 10*3/uL (ref 150.0–400.0)
RBC: 3.88 Mil/uL (ref 3.87–5.11)
RDW: 13.2 % (ref 11.5–15.5)
WBC: 4.4 10*3/uL (ref 4.0–10.5)

## 2021-10-16 LAB — BASIC METABOLIC PANEL
BUN: 13 mg/dL (ref 6–23)
CO2: 32 mEq/L (ref 19–32)
Calcium: 9.6 mg/dL (ref 8.4–10.5)
Chloride: 101 mEq/L (ref 96–112)
Creatinine, Ser: 0.66 mg/dL (ref 0.40–1.20)
GFR: 84.65 mL/min (ref >60.00)
Glucose, Bld: 76 mg/dL (ref 70–99)
Potassium: 4.2 mEq/L (ref 3.5–5.1)
Sodium: 139 mEq/L (ref 135–145)

## 2021-10-16 LAB — TSH: TSH: 6.92 u[IU]/mL — ABNORMAL HIGH (ref 0.35–5.50)

## 2021-10-16 NOTE — Progress Notes (Signed)
Per the orders of Dr.Rudd pt is here for labs, pt tolerated draw well.

## 2021-10-19 MED ORDER — LEVOTHYROXINE SODIUM 75 MCG PO TABS: 75.0000 ug | ORAL_TABLET | Freq: Every day | ORAL | 3 refills | Status: DC

## 2021-10-19 NOTE — Addendum Note (Signed)
Addended by: Haydee Salter on: 10/19/2021 04:51 PM   Modules accepted: Orders

## 2021-11-04 ENCOUNTER — Telehealth: Payer: Self-pay

## 2021-11-04 ENCOUNTER — Ambulatory Visit: Payer: Medicare PPO

## 2021-11-04 NOTE — Telephone Encounter (Signed)
Called for Mwv no answer x 3   Patient may reschedule for next available appointment .   L.Michaiah Maiden,LPN

## 2021-11-05 DIAGNOSIS — S92335D Nondisplaced fracture of third metatarsal bone, left foot, subsequent encounter for fracture with routine healing: Secondary | ICD-10-CM | POA: Diagnosis not present

## 2021-11-05 DIAGNOSIS — R262 Difficulty in walking, not elsewhere classified: Secondary | ICD-10-CM | POA: Diagnosis not present

## 2021-11-05 DIAGNOSIS — R600 Localized edema: Secondary | ICD-10-CM | POA: Diagnosis not present

## 2021-11-10 ENCOUNTER — Telehealth: Payer: Self-pay

## 2021-11-10 ENCOUNTER — Ambulatory Visit (INDEPENDENT_AMBULATORY_CARE_PROVIDER_SITE_OTHER): Payer: Medicare PPO

## 2021-11-10 NOTE — Telephone Encounter (Signed)
Called patient x3 MWV 0900 no answers vm is full unable to leave a message.  L.Alycia Cooperwood,LPN

## 2021-11-18 ENCOUNTER — Ambulatory Visit (INDEPENDENT_AMBULATORY_CARE_PROVIDER_SITE_OTHER): Payer: Medicare PPO

## 2021-11-18 VITALS — Wt 137.0 lb

## 2021-11-18 DIAGNOSIS — Z86010 Personal history of colonic polyps: Secondary | ICD-10-CM

## 2021-11-18 DIAGNOSIS — Z0000 Encounter for general adult medical examination without abnormal findings: Secondary | ICD-10-CM | POA: Diagnosis not present

## 2021-11-18 DIAGNOSIS — Z1211 Encounter for screening for malignant neoplasm of colon: Secondary | ICD-10-CM

## 2021-11-18 NOTE — Patient Instructions (Signed)
Ms. Nancy Cuevas , Thank you for taking time to come for your Medicare Wellness Visit. I appreciate your ongoing commitment to your health goals. Please review the following plan we discussed and let me know if I can assist you in the future.   Screening recommendations/referrals: Colonoscopy: Done 06/24/2017 - repeat in 3 years - referral sent to Dr Carlean Purl today Mammogram: Done 12/04/2019 - Recommend Repeat annually - appointment 12/14/2021 Bone Density: Done 12/04/2019 - Repeat every 2 years - appointment 12/09/2021 Recommended yearly ophthalmology/optometry visit for glaucoma screening and checkup Recommended yearly dental visit for hygiene and checkup  Vaccinations: Influenza vaccine: Done 919/2022 - Repeat annually  Pneumococcal vaccine: Done 07/31/2014 & 07/19/2016 Tdap vaccine: Done 02/07/2008 - Repeat in 10 years *past due Shingles vaccine: Zostavax done 02/07/2008; Shingrix Done 08/06/2019 - get second dose at next visit or with pharmacy   Covid-19: Done 11/20/2019, 12/11/2019, & 09/01/2020 - get boosters at pharmacy when you are able  Advanced directives: Please bring a copy of your health care power of attorney and living will to the office to be added to your chart at your convenience.   Conditions/risks identified: Keep up the great work! Aim for 30 minutes of exercise or brisk walking each day, drink 6-8 glasses of water and eat lots of fruits and vegetables.   Next appointment: Follow up in one year for your annual wellness visit    Preventive Care 65 Years and Older, Female Preventive care refers to lifestyle choices and visits with your health care provider that can promote health and wellness. What does preventive care include? A yearly physical exam. This is also called an annual well check. Dental exams once or twice a year. Routine eye exams. Ask your health care provider how often you should have your eyes checked. Personal lifestyle choices, including: Daily care of your teeth  and gums. Regular physical activity. Eating a healthy diet. Avoiding tobacco and drug use. Limiting alcohol use. Practicing safe sex. Taking low-dose aspirin every day. Taking vitamin and mineral supplements as recommended by your health care provider. What happens during an annual well check? The services and screenings done by your health care provider during your annual well check will depend on your age, overall health, lifestyle risk factors, and family history of disease. Counseling  Your health care provider may ask you questions about your: Alcohol use. Tobacco use. Drug use. Emotional well-being. Home and relationship well-being. Sexual activity. Eating habits. History of falls. Memory and ability to understand (cognition). Work and work Statistician. Reproductive health. Screening  You may have the following tests or measurements: Height, weight, and BMI. Blood pressure. Lipid and cholesterol levels. These may be checked every 5 years, or more frequently if you are over 56 years old. Skin check. Lung cancer screening. You may have this screening every year starting at age 18 if you have a 30-pack-year history of smoking and currently smoke or have quit within the past 15 years. Fecal occult blood test (FOBT) of the stool. You may have this test every year starting at age 59. Flexible sigmoidoscopy or colonoscopy. You may have a sigmoidoscopy every 5 years or a colonoscopy every 10 years starting at age 34. Hepatitis C blood test. Hepatitis B blood test. Sexually transmitted disease (STD) testing. Diabetes screening. This is done by checking your blood sugar (glucose) after you have not eaten for a while (fasting). You may have this done every 1-3 years. Bone density scan. This is done to screen for osteoporosis. You may  have this done starting at age 46. Mammogram. This may be done every 1-2 years. Talk to your health care provider about how often you should have regular  mammograms. Talk with your health care provider about your test results, treatment options, and if necessary, the need for more tests. Vaccines  Your health care provider may recommend certain vaccines, such as: Influenza vaccine. This is recommended every year. Tetanus, diphtheria, and acellular pertussis (Tdap, Td) vaccine. You may need a Td booster every 10 years. Zoster vaccine. You may need this after age 71. Pneumococcal 13-valent conjugate (PCV13) vaccine. One dose is recommended after age 95. Pneumococcal polysaccharide (PPSV23) vaccine. One dose is recommended after age 15. Talk to your health care provider about which screenings and vaccines you need and how often you need them. This information is not intended to replace advice given to you by your health care provider. Make sure you discuss any questions you have with your health care provider. Document Released: 10/31/2015 Document Revised: 06/23/2016 Document Reviewed: 08/05/2015 Elsevier Interactive Patient Education  2017 [employer] Prevention in the Home Falls can cause injuries. They can happen to people of all ages. There are many things you can do to make your home safe and to help prevent falls. What can I do on the outside of my home? Regularly fix the edges of walkways and driveways and fix any cracks. Remove anything that might make you trip as you walk through a door, such as a raised step or threshold. Trim any bushes or trees on the path to your home. Use bright outdoor lighting. Clear any walking paths of anything that might make someone trip, such as rocks or tools. Regularly check to see if handrails are loose or broken. Make sure that both sides of any steps have handrails. Any raised decks and porches should have guardrails on the edges. Have any leaves, snow, or ice cleared regularly. Use sand or salt on walking paths during winter. Clean up any spills in your garage right away. This includes oil  or grease spills. What can I do in the bathroom? Use night lights. Install grab bars by the toilet and in the tub and shower. Do not use towel bars as grab bars. Use non-skid mats or decals in the tub or shower. If you need to sit down in the shower, use a plastic, non-slip stool. Keep the floor dry. Clean up any water that spills on the floor as soon as it happens. Remove soap buildup in the tub or shower regularly. Attach bath mats securely with double-sided non-slip rug tape. Do not have throw rugs and other things on the floor that can make you trip. What can I do in the bedroom? Use night lights. Make sure that you have a light by your bed that is easy to reach. Do not use any sheets or blankets that are too big for your bed. They should not hang down onto the floor. Have a firm chair that has side arms. You can use this for support while you get dressed. Do not have throw rugs and other things on the floor that can make you trip. What can I do in the kitchen? Clean up any spills right away. Avoid walking on wet floors. Keep items that you use a lot in easy-to-reach places. If you need to reach something above you, use a strong step stool that has a grab bar. Keep electrical cords out of the way. Do not use floor polish  or wax that makes floors slippery. If you must use wax, use non-skid floor wax. Do not have throw rugs and other things on the floor that can make you trip. What can I do with my stairs? Do not leave any items on the stairs. Make sure that there are handrails on both sides of the stairs and use them. Fix handrails that are broken or loose. Make sure that handrails are as long as the stairways. Check any carpeting to make sure that it is firmly attached to the stairs. Fix any carpet that is loose or worn. Avoid having throw rugs at the top or bottom of the stairs. If you do have throw rugs, attach them to the floor with carpet tape. Make sure that you have a light  switch at the top of the stairs and the bottom of the stairs. If you do not have them, ask someone to add them for you. What else can I do to help prevent falls? Wear shoes that: Do not have high heels. Have rubber bottoms. Are comfortable and fit you well. Are closed at the toe. Do not wear sandals. If you use a stepladder: Make sure that it is fully opened. Do not climb a closed stepladder. Make sure that both sides of the stepladder are locked into place. Ask someone to hold it for you, if possible. Clearly mark and make sure that you can see: Any grab bars or handrails. First and last steps. Where the edge of each step is. Use tools that help you move around (mobility aids) if they are needed. These include: Canes. Walkers. Scooters. Crutches. Turn on the lights when you go into a dark area. Replace any light bulbs as soon as they burn out. Set up your furniture so you have a clear path. Avoid moving your furniture around. If any of your floors are uneven, fix them. If there are any pets around you, be aware of where they are. Review your medicines with your doctor. Some medicines can make you feel dizzy. This can increase your chance of falling. Ask your doctor what other things that you can do to help prevent falls. This information is not intended to replace advice given to you by your health care provider. Make sure you discuss any questions you have with your health care provider. Document Released: 07/31/2009 Document Revised: 03/11/2016 Document Reviewed: 11/08/2014 Elsevier Interactive Patient Education  2017 [employer].

## 2021-11-18 NOTE — Progress Notes (Signed)
Subjective:   Nancy Cuevas is a 76 y.o. female who presents for Medicare Annual (Subsequent) preventive examination.  Virtual Visit via Telephone Note  I connected with  Roosvelt Harps on 11/18/21 at  3:45 PM EST by telephone and verified that I am speaking with the correct person using two identifiers.  Location: Patient: Home Provider: [place] Persons participating in the virtual visit: patient/Nurse Health Advisor   I discussed the limitations, risks, security and privacy concerns of performing an evaluation and management service by telephone and the availability of in person appointments. The patient expressed understanding and agreed to proceed.  Interactive audio and video telecommunications were attempted between this nurse and patient, however failed, due to patient having technical difficulties OR patient did not have access to video capability.  We continued and completed visit with audio only.  Some vital signs may be absent or patient reported.   Eryk Beavers E Darletta Noblett, LPN   Review of Systems     Cardiac Risk Factors include: advanced age (>32men, >36 women);dyslipidemia;Other (see comment), Risk factor comments: aortic atherosclerosis     Objective:    Today's Vitals   11/18/21 1621  Weight: 137 lb (62.1 kg)   Body mass index is 23.52 kg/m.  Advanced Directives 11/18/2021 10/07/2020 06/05/2018 06/24/2017 06/10/2017 05/18/2017 03/17/2017  Does Patient Have a Medical Advance Directive? Yes Yes Yes Yes Yes Yes Yes  Type of Paramedic of [place];Living will [place];Living will [place];Living will [place];Living will Living will;Healthcare Power of Attorney Living will;Healthcare Power of [place];Living will  Does patient want to make changes to medical advance directive? - - - - No - Patient declined - -  Copy of [place] in Chart? No - copy requested Yes - validated most recent copy scanned in chart (See row information) Yes No - copy requested No - copy requested - No - copy requested  Pre-existing out of facility DNR order (yellow form or pink MOST form) - - - - - - -    Current Medications (verified) Outpatient Encounter Medications as of 11/18/2021  Medication Sig   acetaminophen (TYLENOL) 500 MG tablet Take 500 mg by mouth daily as needed for moderate pain or headache.   aspirin 81 MG tablet Take 81 mg by mouth every other day.   CALCIUM-MAGNESIUM-VITAMIN D PO Take 1 tablet by mouth daily.   desonide (DESOWEN) 0.05 % cream Apply topically 2 (two) times daily.   ferrous sulfate 325 (65 FE) MG tablet Take 325 mg by mouth daily.   Flaxseed, Linseed, (FLAX SEED OIL) 1000 MG CAPS Take 1,000 mg by mouth daily.    fluticasone (FLONASE) 50 MCG/ACT nasal spray Place 2 sprays into both nostrils daily.   Hypromellose (ARTIFICIAL TEARS OP) Apply 1 drop to eye daily as needed (dry eyes).   levothyroxine (SYNTHROID) 75 MCG tablet Take 1 tablet (75 mcg total) by mouth daily.   Multiple Vitamin (MULTIVITAMIN) tablet Take 1 tablet by mouth daily.   Omega-3 Fatty Acids (FISH OIL) 1000 MG CAPS Take 1 capsule by mouth 2 (two) times daily.    omeprazole (PRILOSEC) 20 MG capsule TAKE 1 CAPSULE BY MOUTH DAILY   pravastatin (PRAVACHOL) 40 MG tablet TAKE 1 TABLET BY MOUTH EVERY DAY   No facility-administered encounter medications on file as of 11/18/2021.    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 Holter (1/12): short runs of atrial tachycardia versus MAT. ;  Echo (14) EF 55-60%, normal wall motion, mild MR, mild LAE, atrial septal aneurysm   Personal history of colonic and rectal adenomas 03/22/2013   POSTMENOPAUSAL STATUS 02/07/2008   Symptomatic menopausal or female climacteric states    Past Surgical History:  Procedure Laterality Date   ABDOMINAL HYSTERECTOMY  1988   prolapse uterus   BUNIONECTOMY Right 10/2014   CARPAL TUNNEL RELEASE  11/15/2011   Procedure: CARPAL TUNNEL RELEASE;  Surgeon: Tennis Must, MD;  Location: [facility];  Service: Orthopedics;  Laterality: Left;   CATARACT EXTRACTION Left 2011   COLONOSCOPY  2003,2014   normal,   COLONOSCOPY WITH PROPOFOL N/A 06/24/2017   Procedure: COLONOSCOPY WITH PROPOFOL;  Surgeon: Gatha Mayer, MD;  Location: [facility];  Service: Endoscopy;  Laterality: N/A;   EYE SURGERY     FRACTURE SURGERY  2006   HAND SURGERY  oct 2012 and jan 2013   Dr Dean--[facility]   HOT HEMOSTASIS N/A 06/24/2017   Procedure: HOT HEMOSTASIS (ARGON PLASMA COAGULATION/BICAP);  Surgeon: Gatha Mayer, MD;  Location: [facility];  Service: Endoscopy;  Laterality: N/A;   left wrist hardware removal 10/12  07/2011   OPEN REDUCTION INTERNAL FIXATION (ORIF) DISTAL RADIAL FRACTURE Left 03/2005   left wrist   PARTIAL PROCTECTOMY BY TEM  06/22/2013   Procedure: PARTIAL PROCTECTOMY BY TEM;  Surgeon: Leighton Ruff, MD;  Location: [facility];  Service: General;;   RETINAL DETACHMENT SURGERY  2009   left   Family History  Problem Relation Age of Onset   Diabetes Mother        1st degree relative   Hypertension Mother     Stroke Mother    Heart disease Father    Cancer Maternal Aunt    Heart disease Maternal Uncle    ALS Paternal Aunt    Diabetes Paternal Uncle    Heart disease Maternal Grandfather    Heart disease Paternal Grandfather    Cancer Cousin    Heart disease Cousin    Alzheimer's disease Cousin    Breast cancer Other        1st degree relative <50   Osteoporosis Other        1st degree relative   Colon cancer Neg Hx    Esophageal cancer Neg Hx    Stomach cancer Neg Hx    Rectal cancer Neg Hx    Social History   Socioeconomic History   Marital status: Married    Spouse name: Not on file   Number of children: 2   Years of education: Not on file   Highest education level: Not on file  Occupational History   Occupation: Retired  Tobacco Use   Smoking status: Never   Smokeless tobacco: Never  Vaping Use   Vaping Use: Never used  Substance and Sexual Activity   Alcohol use: No   Drug use: No   Sexual activity: Not Currently    Birth control/protection: None  Other Topics Concern   Not on file  Social History Narrative   Married one son one daughter and 3 grandchildren    Likes to travel   No alcohol tobacco or drug use reported   Social Determinants of Radio broadcast assistant Strain: Low Risk    Difficulty of Paying Living Expenses: Not hard at all  Food Insecurity: No Food Insecurity   Worried About Charity fundraiser in the Last Year: Never true   Arboriculturist in the Last Year: Never true  Transportation Needs: No Transportation Needs   Lack of Transportation (Medical): No   Lack of Transportation (Non-Medical): No  Physical Activity: Sufficiently Active   Days of Exercise per Week: 7 days   Minutes of Exercise per Session: 30 min  Stress: No Stress Concern Present   Feeling of Stress : Not at all  Social Connections: Socially Integrated   Frequency of Communication with Friends and Family: More than three times a week   Frequency of Social Gatherings with  Friends and Family: More than three times a week   Attends Religious Services: 1 to 4 times per year   Active Member of [employer] or Organizations: Yes   Attends Archivist Meetings: 1 to 4 times per year   Marital Status: Married    Tobacco Counseling Counseling given: Not Answered   Clinical Intake:  Pre-visit preparation completed: Yes  Pain : No/denies pain     BMI - recorded: 23.52 Nutritional Status: BMI of 19-24  Normal Nutritional Risks: None Diabetes: No  How often do you need to have someone help you when you read instructions, pamphlets, or other written materials from your doctor or pharmacy?: 1 - Never  Diabetic? no  Interpreter Needed?: No  Information entered by :: Krishon Adkison, LPN   Activities of Daily Living In your present state of health, do you have any difficulty performing the following activities: 11/18/2021  Hearing? N  Vision? N  Difficulty concentrating or making decisions? N  Walking or climbing stairs? N  Dressing or bathing? N  Doing errands, shopping? N  Preparing Food and eating ? N  Using the Toilet? N  In the past six months, have you accidently leaked urine? N  Do you have problems with loss of bowel control? N  Managing your Medications? N  Managing your Finances? N  Housekeeping or managing your Housekeeping? N  Some recent data might be hidden    Patient Care Team: Haydee Salter, MD as PCP - General (Family Medicine) Gatha Mayer, MD as Consulting Physician (Gastroenterology) Marygrace Drought, MD as Consulting Physician (Ophthalmology) Larey Dresser, MD as Consulting Physician (Cardiology) Ladell Pier, DDS as Consulting Physician (Dentistry) Linus Mako, MD as Consulting Physician  Indicate any recent Medical Services you may have received from other than [facility] providers in the past year (date may be approximate).     Assessment:   This is a routine wellness examination for [facility].  Hearing/Vision  screen Hearing Screening - Comments:: Denies hearing difficulties   Vision Screening - Comments:: Wears otc reading glasses prn only - up to date with routine eye exams with [facility]  Dietary  issues and exercise activities discussed: Current Exercise Habits: Home exercise routine, Type of exercise: walking;stretching, Time (Minutes): 30, Frequency (Times/Week): 7, Weekly Exercise (Minutes/Week): 210, Intensity: Mild, Exercise limited by: None identified   Goals Addressed             This Visit's Progress    Increase physical activity   On track    Continue current exercise regimen and start going to the [facility] 3x weekly.       Depression Screen PHQ 2/9 Scores 11/18/2021 10/07/2020 09/05/2019 06/05/2018 03/17/2017 05/25/2016 10/24/2015  PHQ - 2 Score 0 0 0 0 0 0 0    Fall Risk Fall Risk  11/18/2021 10/07/2020 06/05/2018 03/17/2017 05/25/2016  Falls in the past year? 0 0 No No No  Number falls in past yr: 0 0 - - -  Injury with Fall? 0 0 - - -  Risk for fall due to : No Fall Risks - - - -  Follow up Falls prevention discussed Falls prevention discussed - - -    FALL RISK PREVENTION PERTAINING TO THE HOME:  Any stairs in or around the home? Yes  If so, are there any without handrails? No  Home free of loose throw rugs in walkways, pet beds, electrical cords, etc? Yes  Adequate lighting in your home to reduce risk of falls? Yes   ASSISTIVE DEVICES UTILIZED TO PREVENT FALLS:  Life alert? No  Use of a cane, walker or w/c? No  Grab bars in the bathroom? No  Shower chair or bench in shower? Yes  Elevated toilet seat or a handicapped toilet? Yes   TIMED UP AND GO:  Was the test performed? No . Telephonic visit  Cognitive Function: Normal cognitive status assessed by direct observation by this Nurse Health Advisor. No abnormalities found.    MMSE - Mini Mental State Exam 03/17/2017  Orientation to time 5  Orientation to Place 5  Registration 3  Attention/  Calculation 5  Recall 3  Language- name 2 objects 2  Language- repeat 1  Language- follow 3 step command 3  Language- read & follow direction 1  Write a sentence 1  Copy design 1  Total score 30        Immunizations Immunization History  Administered Date(s) Administered   Fluad Quad(high Dose 65+) 06/22/2019   Influenza Split 09/17/2011   Influenza Whole 08/30/2007, 07/31/2008, 09/02/2010, 09/01/2012   Influenza, High Dose Seasonal PF 07/11/2013, 07/31/2014, 07/22/2015, 07/19/2016, 07/09/2017   Influenza-Unspecified 06/18/2020, 07/06/2021   PFIZER(Purple Top)SARS-COV-2 Vaccination 11/20/2019, 12/11/2019, 09/01/2020   Pneumococcal Conjugate-13 07/31/2014   Pneumococcal Polysaccharide-23 09/21/2010, 07/19/2016   Td 02/07/2008   Zoster Recombinat (Shingrix) 08/06/2019   Zoster, Live 02/07/2008    TDAP status: Due, Education has been provided regarding the importance of this vaccine. Advised may receive this vaccine at local pharmacy or Health Dept. Aware to provide a copy of the vaccination record if obtained from local pharmacy or Health Dept. Verbalized acceptance and understanding.  Flu Vaccine status: Up to date  Pneumococcal vaccine status: Up to date  Covid-19 vaccine status: Completed vaccines  Qualifies for Shingles Vaccine? Yes   Zostavax completed Yes   Shingrix Completed?: No.    Education has been provided regarding the importance of this vaccine. Patient has been advised to call insurance company to determine out of pocket expense if they have not yet received this vaccine. Advised may also receive vaccine at local pharmacy or Health Dept. Verbalized acceptance and understanding.  Screening Tests Health  Maintenance  Topic Date Due   TETANUS/TDAP  02/06/2018   Zoster Vaccines- Shingrix (2 of 2) 10/01/2019   COVID-19 Vaccine (4 - Booster for Pfizer series) 10/27/2020   MAMMOGRAM  01/15/2022 (Originally 12/03/2020)   COLONOSCOPY (Pts 45-35yrs Insurance coverage  will need to be confirmed)  01/15/2022 (Originally 06/24/2020)   Pneumonia Vaccine 31+ Years old  Completed   INFLUENZA VACCINE  Completed   DEXA SCAN  Completed   Hepatitis C Screening  Completed   HPV VACCINES  Aged Out    Health Maintenance  Health Maintenance Due  Topic Date Due   TETANUS/TDAP  02/06/2018   Zoster Vaccines- Shingrix (2 of 2) 10/01/2019   COVID-19 Vaccine (4 - Booster for [place] series) 10/27/2020    Colorectal cancer screening: Referral to GI placed 11/18/21. Pt aware the office will call re: appt.  Mammogram status: Completed 12/04/2019. Repeat every year she has appt 12/14/21  Bone Density status: Completed 12/04/2019. Results reflect: Bone density results: OSTEOPENIA. Repeat every 2 years.  Lung Cancer Screening: (Low Dose CT Chest recommended if Age 19-80 years, 30 pack-year currently smoking OR have quit w/in 15years.) does not qualify.  Additional Screening:  Hepatitis C Screening: does qualify; Completed 10/24/2015  Vision Screening: Recommended annual ophthalmology exams for early detection of glaucoma and other disorders of the eye. Is the patient up to date with their annual eye exam?  Yes  Who is the provider or what is the name of the office in which the patient attends annual eye exams? [facility] If pt is not established with a provider, would they like to be referred to a provider to establish care? No .   Dental Screening: Recommended annual dental exams for proper oral hygiene  Community Resource Referral / Chronic Care Management: CRR required this visit?  No   CCM required this visit?  No      Plan:     I have personally reviewed and noted the following in the patients chart:   Medical and social history Use of alcohol, tobacco or illicit drugs  Current medications and supplements including opioid prescriptions.  Functional ability and status Nutritional status Physical activity Advanced directives List of other  physicians Hospitalizations, surgeries, and ER visits in previous 12 months Vitals Screenings to include cognitive, depression, and falls Referrals and appointments  In addition, I have reviewed and discussed with patient certain preventive protocols, quality metrics, and best practice recommendations. A written personalized care plan for preventive services as well as general preventive health recommendations were provided to patient.   Due to this being a telephonic visit, the after visit summary with patients personalized plan was offered to patient via mail or my-chart. Patient would like to access on my-chart  Sandrea Hammond, LPN   01/18/8767   Nurse Notes: None

## 2021-12-01 DIAGNOSIS — S92335D Nondisplaced fracture of third metatarsal bone, left foot, subsequent encounter for fracture with routine healing: Secondary | ICD-10-CM | POA: Diagnosis not present

## 2021-12-01 DIAGNOSIS — R262 Difficulty in walking, not elsewhere classified: Secondary | ICD-10-CM | POA: Diagnosis not present

## 2021-12-01 DIAGNOSIS — R600 Localized edema: Secondary | ICD-10-CM | POA: Diagnosis not present

## 2021-12-09 ENCOUNTER — Ambulatory Visit
Admission: RE | Admit: 2021-12-09 | Discharge: 2021-12-09 | Disposition: A | Discharge status: Home / Self Care | Payer: Medicare PPO | Source: Ambulatory Visit | Attending: Family Medicine | Admitting: Family Medicine

## 2021-12-09 DIAGNOSIS — M8589 Other specified disorders of bone density and structure, multiple sites: Secondary | ICD-10-CM | POA: Diagnosis not present

## 2021-12-09 DIAGNOSIS — Z780 Asymptomatic menopausal state: Secondary | ICD-10-CM | POA: Diagnosis not present

## 2021-12-09 DIAGNOSIS — M85852 Other specified disorders of bone density and structure, left thigh: Secondary | ICD-10-CM

## 2021-12-14 ENCOUNTER — Ambulatory Visit
Admission: RE | Admit: 2021-12-14 | Discharge: 2021-12-14 | Disposition: A | Discharge status: Home / Self Care | Payer: Medicare PPO | Source: Ambulatory Visit | Attending: Family Medicine | Admitting: Family Medicine

## 2021-12-14 ENCOUNTER — Other Ambulatory Visit: Payer: Self-pay

## 2021-12-14 DIAGNOSIS — S92335D Nondisplaced fracture of third metatarsal bone, left foot, subsequent encounter for fracture with routine healing: Secondary | ICD-10-CM | POA: Diagnosis not present

## 2021-12-14 DIAGNOSIS — Z1231 Encounter for screening mammogram for malignant neoplasm of breast: Secondary | ICD-10-CM | POA: Diagnosis not present

## 2021-12-14 DIAGNOSIS — R262 Difficulty in walking, not elsewhere classified: Secondary | ICD-10-CM | POA: Diagnosis not present

## 2021-12-14 DIAGNOSIS — R600 Localized edema: Secondary | ICD-10-CM | POA: Diagnosis not present

## 2022-01-04 ENCOUNTER — Telehealth: Payer: Self-pay | Admitting: Family Medicine

## 2022-01-04 NOTE — Telephone Encounter (Signed)
Spoke to patient regarding having some neck pain x 1 week. Made her an appointment for 8:20 am 01/05/22.  Dm/cma ? ?

## 2022-01-04 NOTE — Telephone Encounter (Signed)
Pt called and is requesting a call back from [place] about some symptoms. Callback is [phone] ?

## 2022-01-05 ENCOUNTER — Other Ambulatory Visit: Payer: Self-pay

## 2022-01-05 ENCOUNTER — Ambulatory Visit: Payer: Medicare PPO | Admitting: Family Medicine

## 2022-01-05 VITALS — BP 120/70 | HR 70 | Temp 97.0°F | Ht 64.0 in | Wt 137.8 lb

## 2022-01-05 DIAGNOSIS — M436 Torticollis: Secondary | ICD-10-CM

## 2022-01-05 NOTE — Patient Instructions (Signed)
Apply moist heat tot he neck for 10-15 minutes several times a day. ?Perform stretches of the neck to relieve muscle spasm. ?Take Aleve (naproxen 220 mg) twice a day for 7 days ?

## 2022-01-05 NOTE — Progress Notes (Signed)
?[facility] ?[facility] ?[place] ?[place] [address] ?Dept: [phone] ?Dept Fax: [phone] ? ?Office Visit ? ?Subjective:  ? ? Patient ID: Nancy Cuevas, female    DOB: Nov 14, 1945, 76 y.o..   MRN: [id number] ? ?Chief Complaint  ?Patient presents with  ? Acute Visit  ?  C/o having neck pain off/on x 1 week. No known injury. Has taken Tylenol.    ? ? ?History of Present Illness: ? ?Patient is in today for evaluation of left neck pain. She notes this started about 6 days ago. The pain is worse at times, but she notes she can get in a position that relieves this when lying down. [name] has tried using heat/ice and topical therapy with Biofreeze and lidocaine. These all provide some temporary relief. She had read on the Internet about neck pain being associated with heart attacks. She denies any left arm pain or chest pain. She did take Tylenol one day and took a single Aleve which also helped temporarily. ? ?Past Medical History: ?Patient Active Problem List  ? Diagnosis Date Noted  ? Low iron stores 09/29/2021  ? Allergic rhinitis 09/29/2021  ? Varicose veins of both lower extremities 08/21/2021  ? Aortic atherosclerosis ([place]) 08/12/2021  ? Hiatal hernia 08/12/2021  ? Hematuria 06/18/2021  ? Atrial tachycardia ([place]) 09/04/2019  ? Stricture and stenosis of esophagus 09/04/2019  ? Osteopenia 09/04/2019  ? History of colonic polyps 03/22/2013  ? Hyperlipidemia 10/27/2010  ? Female stress incontinence 10/27/2010  ? Hypothyroidism 07/18/2007  ? ?Past Surgical History:  ?Procedure Laterality Date  ? ABDOMINAL HYSTERECTOMY  1988  ? prolapse uterus  ? BUNIONECTOMY Right 10/2014  ? CARPAL TUNNEL RELEASE  11/15/2011  ? Procedure: CARPAL TUNNEL RELEASE;  Surgeon: Tennis Must, MD;  Location: [facility];  Service: Orthopedics;  Laterality: Left;  ? CATARACT EXTRACTION Left 2011  ? COLONOSCOPY  [id number]  ? normal,  ? COLONOSCOPY WITH PROPOFOL N/A  06/24/2017  ? Procedure: COLONOSCOPY WITH PROPOFOL;  Surgeon: Gatha Mayer, MD;  [facility]: [facility];  Service: Endoscopy;  Laterality: N/A;  ? EYE SURGERY    ? FRACTURE SURGERY  2006  ? HAND SURGERY  oct 2012 and jan 2013  ? Dr Dean--[facility]  ? HOT HEMOSTASIS N/A 06/24/2017  ? Procedure: HOT HEMOSTASIS (ARGON PLASMA COAGULATION/BICAP);  Surgeon: Gatha Mayer, MD;  Location: [facility];  Service: Endoscopy;  Laterality: N/A;  ? left wrist hardware removal 10/12  07/2011  ? OPEN REDUCTION INTERNAL FIXATION (ORIF) DISTAL RADIAL FRACTURE Left 03/2005  ? left wrist  ? PARTIAL PROCTECTOMY BY TEM  06/22/2013  ? Procedure: PARTIAL PROCTECTOMY BY TEM;  Surgeon: Leighton Ruff, MD;  Location: [facility];  Service: General;;  ? RETINAL DETACHMENT SURGERY  2009  ? left  ? ?Family History  ?Problem Relation Age of Onset  ? Diabetes Mother   ?     1st degree relative  ? Hypertension Mother   ? Stroke Mother   ? Heart disease Father   ? Cancer Maternal Aunt   ? Heart disease Maternal Uncle   ? ALS Paternal Aunt   ? Diabetes Paternal Uncle   ? Heart disease Maternal Grandfather   ? Heart disease Paternal Grandfather   ? Cancer Cousin   ? Heart disease Cousin   ? Alzheimer's disease Cousin   ? Osteoporosis Other   ?     1st degree relative  ? Colon cancer Neg Hx   ?  Esophageal cancer Neg Hx   ? Stomach cancer Neg Hx   ? Rectal cancer Neg Hx   ? ?Outpatient Medications Prior to Visit  ?Medication Sig Dispense Refill  ? acetaminophen (TYLENOL) 500 MG tablet Take 500 mg by mouth daily as needed for moderate pain or headache.    ? aspirin 81 MG tablet Take 81 mg by mouth every other day.    ? CALCIUM-MAGNESIUM-VITAMIN D PO Take 1 tablet by mouth daily.    ? desonide (DESOWEN) 0.05 % cream Apply topically 2 (two) times daily. 30 g 0  ? ferrous sulfate 325 (65 FE) MG tablet Take 325 mg by mouth daily.    ? Flaxseed, Linseed, (FLAX SEED OIL) 1000 MG CAPS Take 1,000 mg by mouth daily.     ? fluticasone (FLONASE) 50 MCG/ACT  nasal spray Place 2 sprays into both nostrils daily. 16 g 1  ? Hypromellose (ARTIFICIAL TEARS OP) Apply 1 drop to eye daily as needed (dry eyes).    ? levothyroxine (SYNTHROID) 75 MCG tablet Take 1 tablet (75 mcg total) by mouth daily. 90 tablet 3  ? Multiple Vitamin (MULTIVITAMIN) tablet Take 1 tablet by mouth daily.    ? Omega-3 Fatty Acids (FISH OIL) 1000 MG CAPS Take 1 capsule by mouth 2 (two) times daily.     ? omeprazole (PRILOSEC) 20 MG capsule TAKE 1 CAPSULE BY MOUTH DAILY 90 capsule 3  ? pravastatin (PRAVACHOL) 40 MG tablet TAKE 1 TABLET BY MOUTH EVERY DAY 90 tablet 3  ? ?No facility-administered medications prior to visit.  ? ?Allergies  ?Allergen Reactions  ? Sulfonamide Derivatives Rash  ? ?Objective:  ? ?Today's Vitals  ? 01/05/22 0818  ?BP: 120/70  ?Pulse: 70  ?Temp: (!) 97 ?F (36.1 ?C)  ?TempSrc: Temporal  ?SpO2: 98%  ?Weight: 137 lb 12.8 oz (62.5 kg)  ?Height: '5\' 4"'$  (1.626 m)  ? ?Body mass index is 23.65 kg/m?.  ? ?General: Well developed, well nourished. No acute distress. ?Neck: Supple. Pain noted over the left paracervical muscle column. Good ROM. ?Extremities: Strength of LUE is 5/5. Sensation normal. ?Psych: Alert and oriented. Normal mood and affect. ? ?Health Maintenance Due  ?Topic Date Due  ? TETANUS/TDAP  02/06/2018  ? Zoster Vaccines- Shingrix (2 of 2) 10/01/2019  ?   ?Assessment & Plan:  ? ?1. Wry neck ?Apply moist heat to the neck for 10-15 minutes several times a day. ?Perform stretches of the neck to relieve muscle spasm. ?Take Aleve (naproxen 220 mg) twice a day for 7 days ? ?Return in about 1 week (around 01/12/2022), or if symptoms worsen or fail to improve.  ? ?Haydee Salter, MD ?

## 2022-01-13 DIAGNOSIS — I83892 Varicose veins of left lower extremities with other complications: Secondary | ICD-10-CM | POA: Diagnosis not present

## 2022-01-21 ENCOUNTER — Encounter: Payer: Self-pay | Admitting: Internal Medicine

## 2022-01-27 DIAGNOSIS — I83892 Varicose veins of left lower extremities with other complications: Secondary | ICD-10-CM | POA: Diagnosis not present

## 2022-01-29 DIAGNOSIS — Z09 Encounter for follow-up examination after completed treatment for conditions other than malignant neoplasm: Secondary | ICD-10-CM | POA: Diagnosis not present

## 2022-01-29 DIAGNOSIS — I872 Venous insufficiency (chronic) (peripheral): Secondary | ICD-10-CM | POA: Diagnosis not present

## 2022-02-02 ENCOUNTER — Other Ambulatory Visit: Payer: Self-pay

## 2022-02-02 ENCOUNTER — Ambulatory Visit (AMBULATORY_SURGERY_CENTER): Payer: Medicare PPO | Admitting: *Deleted

## 2022-02-02 VITALS — Ht 64.0 in | Wt 137.0 lb

## 2022-02-02 DIAGNOSIS — Z86010 Personal history of colonic polyps: Secondary | ICD-10-CM

## 2022-02-02 NOTE — Progress Notes (Signed)
No egg or soy allergy known to patient  ?No issues known to pt with past sedation with any surgeries or procedures ?Patient denies ever being told they had issues or difficulty with intubation  ?No FH of Malignant Hyperthermia ?Pt is not on diet pills ?Pt is not on  home 02  ?Pt is not on blood thinners  ?Pt denies issues with constipation  ?No A fib or A flutter ?Coupon to pt in PV today , Code to Pharmacy and  NO PA's for preps discussed with pt In PV today  ?Discussed with pt there will be an out-of-pocket cost for prep and that varies from $0 to 70 +  dollars - pt verbalized understanding  ?Pt instructed to use Singlecare.com or GoodRx for a price reduction on prep  ? ?PV completed over the phone. Pt verified name, DOB, address and insurance during PV today.  ?Pt mailed instruction packet with copy of consent form to read and not return, and instructions.  ?Pt encouraged to call with questions or issues.  ?If pt has My chart, procedure instructions sent via My Chart  ? ?Sample sheet of over the counter items to purchase for prep sent with instructions. ?

## 2022-02-03 DIAGNOSIS — I83891 Varicose veins of right lower extremities with other complications: Secondary | ICD-10-CM | POA: Diagnosis not present

## 2022-02-05 DIAGNOSIS — I83891 Varicose veins of right lower extremities with other complications: Secondary | ICD-10-CM | POA: Diagnosis not present

## 2022-02-05 DIAGNOSIS — Z09 Encounter for follow-up examination after completed treatment for conditions other than malignant neoplasm: Secondary | ICD-10-CM | POA: Diagnosis not present

## 2022-02-09 ENCOUNTER — Encounter: Payer: Self-pay | Admitting: Internal Medicine

## 2022-02-18 ENCOUNTER — Encounter: Payer: Self-pay | Admitting: Internal Medicine

## 2022-02-18 ENCOUNTER — Ambulatory Visit (AMBULATORY_SURGERY_CENTER): Payer: Medicare PPO | Admitting: Internal Medicine

## 2022-02-18 VITALS — BP 145/57 | HR 61 | Temp 96.6°F | Resp 11 | Ht 64.0 in | Wt 137.0 lb

## 2022-02-18 DIAGNOSIS — Z86010 Personal history of colonic polyps: Secondary | ICD-10-CM

## 2022-02-18 DIAGNOSIS — D128 Benign neoplasm of rectum: Secondary | ICD-10-CM

## 2022-02-18 MED ORDER — SODIUM CHLORIDE 0.9 % IV SOLN: 500.0000 mL | Freq: Once | INTRAVENOUS | Status: DC

## 2022-02-18 NOTE — Progress Notes (Signed)
VS-CW  Pt's states no medical or surgical changes since previsit or office visit.  

## 2022-02-18 NOTE — Progress Notes (Signed)
Called to room to assist during endoscopic procedure.  Patient ID and intended procedure confirmed with present staff. Received instructions for my participation in the procedure from the performing physician.  

## 2022-02-18 NOTE — Progress Notes (Signed)
[facility] History and Physical ? ? ?Primary Care Physician:  Haydee Salter, MD ? ? ?Reason for Procedure:   Hx adenomatous and ssp polyps ? ?Plan:    colonoscopy ? ? ? ? ?HPI: Nancy Cuevas is a 76 y.o. female here - hx polyps 25 mm ssp ascending colon 2018 and 15 mm rectal adenoma and prior to that transanal excision of rectal adenoma ? ? ?Past Medical History:  ?Diagnosis Date  ? Allergy   ? seasonal  ? Anemia   ? Arthritis   ? Benign neoplasm of rectum and anal canal 03/15/2013  ? Bunion   ? Cataract   ? removed from left eye  ? Contact dermatitis and other eczema, due to unspecified cause   ? Contact dermatitis and other eczema, due to unspecified cause   ? Disorder of bone and cartilage, unspecified   ? Female stress incontinence   ? GERD (gastroesophageal reflux disease)   ? HYPERLIPIDEMIA 10/27/2010  ? Hyperpotassemia   ? HYPOTHYROIDISM 07/18/2007  ? Nonspecific (abnormal) findings on radiological and other examination of other intrathoracic organs   ? OSTEOPENIA 03/24/2008  ? Osteopenia   ? Osteoporosis   ? Palpitations   ? Echo (1/12) EF 40-10%, mild diastolic dysfunction, atrial septal aneurysm. Holter (1/12): short runs of atrial tachycardia versus MAT. ;  Echo (14) EF 55-60%, normal wall motion, mild MR, mild LAE, atrial septal aneurysm  ? Personal history of colonic and rectal adenomas 03/22/2013  ? POSTMENOPAUSAL STATUS 02/07/2008  ? Symptomatic menopausal or female climacteric states   ? ? ?Past Surgical History:  ?Procedure Laterality Date  ? ABDOMINAL HYSTERECTOMY  1988  ? prolapse uterus  ? BUNIONECTOMY Right 10/2014  ? CARPAL TUNNEL RELEASE  11/15/2011  ? Procedure: CARPAL TUNNEL RELEASE;  Surgeon: Tennis Must, MD;  Location: [facility];  Service: Orthopedics;  Laterality: Left;  ? CATARACT EXTRACTION Left 2011  ? COLONOSCOPY  [id number]  ? normal,  ? COLONOSCOPY WITH PROPOFOL N/A 06/24/2017  ? Procedure: COLONOSCOPY WITH PROPOFOL;  Surgeon: Gatha Mayer, MD;   Location: [facility];  Service: Endoscopy;  Laterality: N/A;  ? EYE SURGERY    ? FRACTURE SURGERY  2006  ? HAND SURGERY  oct 2012 and jan 2013  ? Dr Dean--[facility]  ? HOT HEMOSTASIS N/A 06/24/2017  ? Procedure: HOT HEMOSTASIS (ARGON PLASMA COAGULATION/BICAP);  Surgeon: Gatha Mayer, MD;  Location: [facility];  Service: Endoscopy;  Laterality: N/A;  ? left wrist hardware removal 10/12  07/2011  ? OPEN REDUCTION INTERNAL FIXATION (ORIF) DISTAL RADIAL FRACTURE Left 03/2005  ? left wrist  ? PARTIAL PROCTECTOMY BY TEM  06/22/2013  ? Procedure: PARTIAL PROCTECTOMY BY TEM;  Surgeon: Leighton Ruff, MD;  Location: [facility];  Service: General;;  ? POLYPECTOMY    ? RETINAL DETACHMENT SURGERY  2009  ? left  ? ? ?Prior to Admission medications   ?Medication Sig Start Date End Date Taking? Authorizing Provider  ?aspirin 81 MG tablet Take 81 mg by mouth every other day.   Yes [provider]  ?CALCIUM-MAGNESIUM-VITAMIN D PO Take 1 tablet by mouth daily.   Yes [provider]  ?desonide (DESOWEN) 0.05 % cream Apply topically 2 (two) times daily. ?Patient taking differently: Apply topically as needed. 09/05/19  Yes Lucille Passy, MD  ?ferrous sulfate 325 (65 FE) MG tablet Take 325 mg by mouth daily.   Yes [provider]  ?fluticasone (FLONASE) 50 MCG/ACT nasal spray Place 2  sprays into both nostrils daily. ?Patient taking differently: Place 2 sprays into both nostrils as needed. 01/23/16  Yes Saguier, Percell Miller, PA-C  ?Hypromellose (ARTIFICIAL TEARS OP) Apply 1 drop to eye daily as needed (dry eyes).   Yes [provider]  ?levothyroxine (SYNTHROID) 75 MCG tablet Take 1 tablet (75 mcg total) by mouth daily. 10/19/21  Yes Haydee Salter, MD  ?Multiple Vitamin (MULTIVITAMIN) tablet Take 1 tablet by mouth daily.   Yes [provider]  ?Omega-3 Fatty Acids (FISH OIL) 1000 MG CAPS Take 1 capsule by mouth 2 (two) times daily.    Yes [provider]  ?omeprazole (PRILOSEC) 20 MG  capsule TAKE 1 CAPSULE BY MOUTH DAILY 04/01/21  Yes Cirigliano, Sheryle K, DO  ?pravastatin (PRAVACHOL) 40 MG tablet TAKE 1 TABLET BY MOUTH EVERY DAY 04/01/21  Yes Cirigliano, Keryl K, DO  ?acetaminophen (TYLENOL) 500 MG tablet Take 500 mg by mouth daily as needed for moderate pain or headache.    [provider]  ?Flaxseed, Linseed, (FLAX SEED OIL) 1000 MG CAPS Take 1,000 mg by mouth daily.     [provider]  ? ? ?Current Outpatient Medications  ?Medication Sig Dispense Refill  ? aspirin 81 MG tablet Take 81 mg by mouth every other day.    ? CALCIUM-MAGNESIUM-VITAMIN D PO Take 1 tablet by mouth daily.    ? desonide (DESOWEN) 0.05 % cream Apply topically 2 (two) times daily. (Patient taking differently: Apply topically as needed.) 30 g 0  ? ferrous sulfate 325 (65 FE) MG tablet Take 325 mg by mouth daily.    ? fluticasone (FLONASE) 50 MCG/ACT nasal spray Place 2 sprays into both nostrils daily. (Patient taking differently: Place 2 sprays into both nostrils as needed.) 16 g 1  ? Hypromellose (ARTIFICIAL TEARS OP) Apply 1 drop to eye daily as needed (dry eyes).    ? levothyroxine (SYNTHROID) 75 MCG tablet Take 1 tablet (75 mcg total) by mouth daily. 90 tablet 3  ? Multiple Vitamin (MULTIVITAMIN) tablet Take 1 tablet by mouth daily.    ? Omega-3 Fatty Acids (FISH OIL) 1000 MG CAPS Take 1 capsule by mouth 2 (two) times daily.     ? omeprazole (PRILOSEC) 20 MG capsule TAKE 1 CAPSULE BY MOUTH DAILY 90 capsule 3  ? pravastatin (PRAVACHOL) 40 MG tablet TAKE 1 TABLET BY MOUTH EVERY DAY 90 tablet 3  ? acetaminophen (TYLENOL) 500 MG tablet Take 500 mg by mouth daily as needed for moderate pain or headache.    ? Flaxseed, Linseed, (FLAX SEED OIL) 1000 MG CAPS Take 1,000 mg by mouth daily.     ? ?Current Facility-Administered Medications  ?Medication Dose Route Frequency Provider Last Rate Last Admin  ? 0.9 %  sodium chloride infusion  500 mL Intravenous Once Gatha Mayer, MD      ? ? ?Allergies as of 02/18/2022  - Review Complete 02/18/2022  ?Allergen Reaction Noted  ? Sulfonamide derivatives Rash   ? ? ?Family History  ?Problem Relation Age of Onset  ? Diabetes Mother   ?     1st degree relative  ? Hypertension Mother   ? Stroke Mother   ? Heart disease Father   ? Cancer Maternal Aunt   ? Heart disease Maternal Uncle   ? ALS Paternal Aunt   ? Diabetes Paternal Uncle   ? Heart disease Maternal Grandfather   ? Heart disease Paternal Grandfather   ? Cancer Cousin   ? Heart disease Cousin   ?  Alzheimer's disease Cousin   ? Osteoporosis Other   ?     1st degree relative  ? Colon cancer Neg Hx   ? Esophageal cancer Neg Hx   ? Stomach cancer Neg Hx   ? Rectal cancer Neg Hx   ? Colon polyps Neg Hx   ? Crohn's disease Neg Hx   ? ? ?Social History  ? ?Socioeconomic History  ? Marital status: Married  ?  Spouse name: Not on file  ? Number of children: 2  ? Years of education: Not on file  ? Highest education level: Not on file  ?Occupational History  ? Occupation: Retired  ?Tobacco Use  ? Smoking status: Never  ?  Passive exposure: Past (years ago 15 years ago when husband smokked)  ? Smokeless tobacco: Never  ?Vaping Use  ? Vaping Use: Never used  ?Substance and Sexual Activity  ? Alcohol use: No  ? Drug use: No  ? Sexual activity: Not Currently  ?  Birth control/protection: None  ?Other Topics Concern  ? Not on file  ?Social History Narrative  ? Married one son one daughter and 3 grandchildren   ? Likes to travel  ? No alcohol tobacco or drug use reported  ? ?Social Determinants of Health  ? ?Financial Resource Strain: Low Risk   ? Difficulty of Paying Living Expenses: Not hard at all  ?Food Insecurity: No Food Insecurity  ? Worried About Charity fundraiser in the Last Year: Never true  ? Ran Out of Food in the Last Year: Never true  ?Transportation Needs: No Transportation Needs  ? Lack of Transportation (Medical): No  ? Lack of Transportation (Non-Medical): No  ?Physical Activity: Sufficiently Active  ? Days of Exercise per  Week: 7 days  ? Minutes of Exercise per Session: 30 min  ?Stress: No Stress Concern Present  ? Feeling of Stress : Not at all  ?Social Connections: Socially Integrated  ? Frequency of Communication with

## 2022-02-18 NOTE — Op Note (Signed)
[place] ?Patient Name: Nancy Cuevas ?Procedure Date: 02/18/2022 9:15 AM ?MRN: [id number] ?Endoscopist: Gatha Mayer , MD ?Age: 76 ?Referring MD:  ?Date of Birth: Mar 09, 1946 ?Gender: Female ?Account #: [id number] ?Procedure:                Colonoscopy ?Indications:              Surveillance: Personal history of adenomatous  ?                          polyps on last colonoscopy > 3 years ago, Last  ?                          colonoscopy: September 2018 ?Medicines:                Monitored Anesthesia Care ?Procedure:                Pre-Anesthesia Assessment: ?                          - Prior to the procedure, a History and Physical  ?                          was performed, and patient medications and  ?                          allergies were reviewed. The patient's tolerance of  ?                          previous anesthesia was also reviewed. The risks  ?                          and benefits of the procedure and the sedation  ?                          options and risks were discussed with the patient.  ?                          All questions were answered, and informed consent  ?                          was obtained. Prior Anticoagulants: The patient has  ?                          taken no previous anticoagulant or antiplatelet  ?                          agents. ASA Grade Assessment: II - A patient with  ?                          mild systemic disease. After reviewing the risks  ?                          and benefits, the patient was deemed in  ?  satisfactory condition to undergo the procedure. ?                          After obtaining informed consent, the colonoscope  ?                          was passed under direct vision. Throughout the  ?                          procedure, the patient's blood pressure, pulse, and  ?                          oxygen saturations were monitored continuously. The  ?                          Olympus PCF-H190DL (#[id number]) Colonoscope  was  ?                          introduced through the anus and advanced to the the  ?                          cecum, identified by appendiceal orifice and  ?                          ileocecal valve. The colonoscopy was performed  ?                          without difficulty. The patient tolerated the  ?                          procedure well. The quality of the bowel  ?                          preparation was good. The ileocecal valve,  ?                          appendiceal orifice, and rectum were photographed.  ?                          The bowel preparation used was Miralax via split  ?                          dose instruction. ?Scope In: 9:30:26 AM ?Scope Out: 9:52:03 AM ?Scope Withdrawal Time: 0 hours 13 minutes 27 seconds  ?Total Procedure Duration: 0 hours 21 minutes 37 seconds  ?Findings:                 The digital rectal exam findings include palpable  ?                          rectal mass. ?                          A 15 to 20 mm polyp was found in the distal rectum.  ?  The polyp was sessile. Biopsies were taken with a  ?                          cold forceps for histology. Verification of patient  ?                          identification for the specimen was done. Estimated  ?                          blood loss was minimal. ?                          Scattered diverticula were found in the right colon. ?                          The exam was otherwise without abnormality on  ?                          direct and retroflexion views. ?Complications:            No immediate complications. ?Estimated Blood Loss:     Estimated blood loss was minimal. ?Impression:               - Palpable rectal mass found on digital rectal  ?                          exam. Irregular mucosa c/w polyp - it is anterior ?                          - One 15 to 20 mm polyp in the distal rectum.  ?                          Biopsied. THIS IS LOCATED IN AREA OF SCAR FROM  ?                           PRIOR TRANSANAL EXCISION (2014) AND WHERE THERE WAS  ?                          ONE POST-OP RECURRENCE BEFORE (2018) ?                          - Diverticulosis in the right colon. ?                          - The examination was otherwise normal on direct  ?                          and retroflexion views. ?                          - Personal history of colonic polyps. 02/2013 -  ?                          diminutive transverse removed, large flat rectal  ?  bxed - referred to surgery ?                          11/2016 - 15 mm rectal adenoma - ? Recurrent - and a  ?                          25 mm ascending ssp + cytologic dysplasia - ?                          06/2017 - no polyps seen ?Recommendation:           - Patient has a contact number available for  ?                          emergencies. The signs and symptoms of potential  ?                          delayed complications were discussed with the  ?                          patient. Return to normal activities tomorrow.  ?                          Written discharge instructions were provided to the  ?                          patient. ?                          - Resume previous diet. ?                          - Continue present medications. ?                          - Await pathology results. ?                          - WILL NEED POLYPECTOMY ? ENDOROTOR OPTION VS  ?                          TRANSANAL EXCISION ?Gatha Mayer, MD ?02/18/2022 10:04:49 AM ?This report has been signed electronically. ?

## 2022-02-18 NOTE — Progress Notes (Signed)
To Pacu, VSS. Report to [email or web address] 

## 2022-02-18 NOTE — Patient Instructions (Addendum)
The polyp in the rectum is growing back. It looks benign - I took biopsies. ? ?Once I get those results will contact you with removal options. There is a new technique using a scope that might be possible though surgery might be needed again. ? ?I appreciate the opportunity to care for you. ?Gatha Mayer, MD, [facility] ? ?YOU HAD AN ENDOSCOPIC PROCEDURE TODAY AT [facility]:   Refer to the procedure report that was given to you for any specific questions about what was found during the examination.  If the procedure report does not answer your questions, please call your gastroenterologist to clarify.  If you requested that your care partner not be given the details of your procedure findings, then the procedure report has been included in a sealed envelope for you to review at your convenience later. ? ?YOU SHOULD EXPECT: Some feelings of bloating in the abdomen. Passage of more gas than usual.  Walking can help get rid of the air that was put into your GI tract during the procedure and reduce the bloating. If you had a lower endoscopy (such as a colonoscopy or flexible sigmoidoscopy) you may notice spotting of blood in your stool or on the toilet paper. If you underwent a bowel prep for your procedure, you may not have a normal bowel movement for a few days. ? ?Please Note:  You might notice some irritation and congestion in your nose or some drainage.  This is from the oxygen used during your procedure.  There is no need for concern and it should clear up in a day or so. ? ?SYMPTOMS TO REPORT IMMEDIATELY: ? ?Following lower endoscopy (colonoscopy or flexible sigmoidoscopy): ? Excessive amounts of blood in the stool ? Significant tenderness or worsening of abdominal pains ? Swelling of the abdomen that is new, acute ? Fever of 100?F or higher ? ?For urgent or emergent issues, a gastroenterologist can be reached at any hour by calling [phone]. ?Do not use MyChart messaging for urgent concerns.   ? ? ?DIET:  We do recommend a small meal at first, but then you may proceed to your regular diet.  Drink plenty of fluids but you should avoid alcoholic beverages for 24 hours. ? ?ACTIVITY:  You should plan to take it easy for the rest of today and you should NOT DRIVE or use heavy machinery until tomorrow (because of the sedation medicines used during the test).   ? ?FOLLOW UP: ?Our staff will call the number listed on your records 48-72 hours following your procedure to check on you and address any questions or concerns that you may have regarding the information given to you following your procedure. If we do not reach you, we will leave a message.  We will attempt to reach you two times.  During this call, we will ask if you have developed any symptoms of COVID 19. If you develop any symptoms (ie: fever, flu-like symptoms, shortness of breath, cough etc.) before then, please call [phone].  If you test positive for Covid 19 in the 2 weeks post procedure, please call and report this information to [place].   ? ?If any biopsies were taken you will be contacted by phone or by letter within the next 1-3 weeks.  Please call us at [phone] if you have not heard about the biopsies in 3 weeks.  ? ? ?SIGNATURES/CONFIDENTIALITY: ?You and/or your care partner have signed paperwork which will be entered into your electronic medical  record.  These signatures attest to the fact that that the information above on your After Visit Summary has been reviewed and is understood.  Full responsibility of the confidentiality of this discharge information lies with you and/or your care-partner.  ? ?

## 2022-02-22 ENCOUNTER — Telehealth: Payer: Self-pay | Admitting: *Deleted

## 2022-02-22 NOTE — Telephone Encounter (Signed)
?  Follow up Call- ? ? ?  02/18/2022  ?  8:52 AM  ?Call back number  ?Post procedure Call Back phone  # [phone]  ?Permission to leave phone message Yes  ?  ? ?Patient questions: ?Message left to call us if necessary ?

## 2022-02-22 NOTE — Telephone Encounter (Signed)
No answer on second attempt follow up call.  ? ?

## 2022-02-24 DIAGNOSIS — I83891 Varicose veins of right lower extremities with other complications: Secondary | ICD-10-CM | POA: Diagnosis not present

## 2022-03-03 DIAGNOSIS — Z09 Encounter for follow-up examination after completed treatment for conditions other than malignant neoplasm: Secondary | ICD-10-CM | POA: Diagnosis not present

## 2022-03-03 DIAGNOSIS — I83891 Varicose veins of right lower extremities with other complications: Secondary | ICD-10-CM | POA: Diagnosis not present

## 2022-03-03 DIAGNOSIS — I83892 Varicose veins of left lower extremities with other complications: Secondary | ICD-10-CM | POA: Diagnosis not present

## 2022-03-05 ENCOUNTER — Encounter: Payer: Self-pay | Admitting: Family Medicine

## 2022-03-05 DIAGNOSIS — K621 Rectal polyp: Secondary | ICD-10-CM | POA: Insufficient documentation

## 2022-03-09 ENCOUNTER — Ambulatory Visit: Payer: Self-pay | Admitting: General Surgery

## 2022-03-09 DIAGNOSIS — D128 Benign neoplasm of rectum: Secondary | ICD-10-CM | POA: Diagnosis not present

## 2022-03-09 NOTE — H&P (View-Only) (Signed)
REFERRING PHYSICIAN:  Gatha Mayer, MD  PROVIDER:  Monico Blitz, MD  MRN: [id number] DOB: 1946-01-03 DATE OF ENCOUNTER: 03/09/2022  Subjective   Chief Complaint: No chief complaint on file.     History of Present Illness: YI FALLETTA is a 76 y.o. female who is seen today as an office consultation at the request of Dr. Carlean Purl for evaluation of No chief complaint on file. .   76 year old female who underwent a TEM transrectal excision of a distal rectal polyp in 2014.  Follow-up colonoscopy in 2018 showed post polypectomy scar with residual polyp distal to the anal verge.  This was removed with a hot snare.  Follow-up colonoscopy 6 months later showed no further polyp.  Repeat colonoscopy in 2023 shows a 15 to 20 mm polyp in the distal rectum.   Review of Systems: A complete review of systems was obtained from the patient.  I have reviewed this information and discussed as appropriate with the patient.  See HPI as well for other ROS.    Medical History: Past Medical History:  Diagnosis Date   Thyroid disease     There is no problem list on file for this patient.   Past Surgical History:  Procedure Laterality Date   BUNION CORRECTION     HYSTERECTOMY       Allergies  Allergen Reactions   Sulfa (Sulfonamide Antibiotics) Unknown    Current Outpatient Medications on File Prior to Visit  Medication Sig Dispense Refill   desonide (DESOWEN) 0.05 % lotion Desonide 0.05 %     fluticasone propionate (FLONASE) 50 mcg/actuation nasal spray Nasal     pravastatin (PRAVACHOL) 40 MG tablet Oral     acetaminophen (TYLENOL) 500 MG tablet Take by mouth     aspirin 81 MG EC tablet Take by mouth     CALCIUM-MAGNESIUM-VITAMIN D2 ORAL Take 1 tablet by mouth once daily     docosahexaenoic acid-epa 120-180 mg Cap Take 1 capsule by mouth 2 (two) times daily     ferrous sulfate 325 (65 FE) MG tablet Take by mouth     flaxseed oiL 1,000 mg Cap Take by mouth      levothyroxine (SYNTHROID) 75 MCG tablet      multivitamin tablet Take 1 tablet by mouth once daily     neomycin-polymyxin-dexAMETHasone (MAXITROL) ophthalmic ointment APPLT TP LEFT EYE TWICE DAILY FOR 14 DAYS     omeprazole (PRILOSEC) 20 MG DR capsule Take 20 mg by mouth once daily     No current facility-administered medications on file prior to visit.    Family History  Problem Relation Age of Onset   Stroke Mother    High blood pressure (Hypertension) Mother    Diabetes Mother      Social History   Tobacco Use  Smoking Status Never  Smokeless Tobacco Never     Social History   Socioeconomic History   Marital status: Married  Tobacco Use   Smoking status: Never   Smokeless tobacco: Never  Vaping Use   Vaping Use: Never used  Substance and Sexual Activity   Alcohol use: Never   Drug use: Never    Objective:    Vitals:   03/09/22 1051  BP: 124/76  Pulse: 78  Temp: 36.7 C (98.1 F)  SpO2: 98%  Weight: 62.6 kg (138 lb)  Height: 162.6 cm ('5\' 4"'$ )     Exam Gen: NAD CV: RRR Lungs: CTA Abd: soft Rectal: Good tone, anterior lesion approximately 2  cm in diameter   Labs, Imaging and Diagnostic Testing:  Procedure: Anoscopy Surgeon: Marcello Moores After the risks and benefits were explained, written consent was obtained for above procedure.  A medical assistant chaperone was present thoroughout the entire procedure.  Anesthesia: none Diagnosis: rectal polyp Findings: Distal rectal polyp at anterior midline.  Approximately 2 cm in diameter.  Does not appear to be fixed to underlying structures.  Does seem to invade into the anal canal.  Colonoscopy report and surgical pathology report reviewed.  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  Assessment and Plan:  Diagnoses and all orders for this visit:  Adenomatous rectal polyp    76 year old female with recurrent distal rectal polyp despite multiple resection attempts  endoscopically and surgically.  I have recommended a full-thickness transanal excision with wide margins.  Hopefully this will keep the polyp from recurring.  We discussed the risk of surgery which include bleeding pain and difficulty with incontinence due to scar tissue affecting her sphincter muscles and pelvic floor muscles.  I believe she understands this.  All questions were answered.  Rosario Adie, MD Colon and Rectal Surgery [facility]

## 2022-03-09 NOTE — H&P (Signed)
REFERRING PHYSICIAN:  Gatha Mayer, MD  PROVIDER:  Monico Blitz, MD  MRN: [id number] DOB: [birth year] DATE OF ENCOUNTER: 03/09/2022  Subjective   Chief Complaint: No chief complaint on file.     History of Present Illness: Nancy Cuevas is a 76 y.o. female who is seen today as an office consultation at the request of Dr. Carlean Purl for evaluation of No chief complaint on file. .   76 year old female who underwent a TEM transrectal excision of a distal rectal polyp in 2014.  Follow-up colonoscopy in 2018 showed post polypectomy scar with residual polyp distal to the anal verge.  This was removed with a hot snare.  Follow-up colonoscopy 6 months later showed no further polyp.  Repeat colonoscopy in 2023 shows a 15 to 20 mm polyp in the distal rectum.   Review of Systems: A complete review of systems was obtained from the patient.  I have reviewed this information and discussed as appropriate with the patient.  See HPI as well for other ROS.    Medical History: Past Medical History:  Diagnosis Date   Thyroid disease     There is no problem list on file for this patient.   Past Surgical History:  Procedure Laterality Date   BUNION CORRECTION     HYSTERECTOMY       Allergies  Allergen Reactions   Sulfa (Sulfonamide Antibiotics) Unknown    Current Outpatient Medications on File Prior to Visit  Medication Sig Dispense Refill   desonide (DESOWEN) 0.05 % lotion Desonide 0.05 %     fluticasone propionate (FLONASE) 50 mcg/actuation nasal spray Nasal     pravastatin (PRAVACHOL) 40 MG tablet Oral     acetaminophen (TYLENOL) 500 MG tablet Take by mouth     aspirin 81 MG EC tablet Take by mouth     CALCIUM-MAGNESIUM-VITAMIN D2 ORAL Take 1 tablet by mouth once daily     docosahexaenoic acid-epa 120-180 mg Cap Take 1 capsule by mouth 2 (two) times daily     ferrous sulfate 325 (65 FE) MG tablet Take by mouth     flaxseed oiL 1,000 mg Cap Take by mouth      levothyroxine (SYNTHROID) 75 MCG tablet      multivitamin tablet Take 1 tablet by mouth once daily     neomycin-polymyxin-dexAMETHasone (MAXITROL) ophthalmic ointment APPLT TP LEFT EYE TWICE DAILY FOR 14 DAYS     omeprazole (PRILOSEC) 20 MG DR capsule Take 20 mg by mouth once daily     No current facility-administered medications on file prior to visit.    Family History  Problem Relation Age of Onset   Stroke Mother    High blood pressure (Hypertension) Mother    Diabetes Mother      Social History   Tobacco Use  Smoking Status Never  Smokeless Tobacco Never     Social History   Socioeconomic History   Marital status: Married  Tobacco Use   Smoking status: Never   Smokeless tobacco: Never  Vaping Use   Vaping Use: Never used  Substance and Sexual Activity   Alcohol use: Never   Drug use: Never    Objective:    Vitals:   03/09/22 1051  BP: 124/76  Pulse: 78  Temp: 36.7 C (98.1 F)  SpO2: 98%  Weight: 62.6 kg (138 lb)  Height: 162.6 cm ('5\' 4"'$ )     Exam Gen: NAD CV: RRR Lungs: CTA Abd: soft Rectal: Good tone, anterior lesion approximately 2  cm in diameter   Labs, Imaging and Diagnostic Testing:  Procedure: Anoscopy Surgeon: Marcello Moores After the risks and benefits were explained, written consent was obtained for above procedure.  A medical assistant chaperone was present thoroughout the entire procedure.  Anesthesia: none Diagnosis: rectal polyp Findings: Distal rectal polyp at anterior midline.  Approximately 2 cm in diameter.  Does not appear to be fixed to underlying structures.  Does seem to invade into the anal canal.  Colonoscopy report and surgical pathology report reviewed.  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  Assessment and Plan:  Diagnoses and all orders for this visit:  Adenomatous rectal polyp    76 year old female with recurrent distal rectal polyp despite multiple resection attempts  endoscopically and surgically.  I have recommended a full-thickness transanal excision with wide margins.  Hopefully this will keep the polyp from recurring.  We discussed the risk of surgery which include bleeding pain and difficulty with incontinence due to scar tissue affecting her sphincter muscles and pelvic floor muscles.  I believe she understands this.  All questions were answered.  Rosario Adie, MD Colon and Rectal Surgery [facility]

## 2022-03-12 ENCOUNTER — Encounter (HOSPITAL_BASED_OUTPATIENT_CLINIC_OR_DEPARTMENT_OTHER): Payer: Self-pay | Admitting: General Surgery

## 2022-03-12 ENCOUNTER — Other Ambulatory Visit: Payer: Self-pay

## 2022-03-12 NOTE — Progress Notes (Signed)
Spoke w/ via phone for pre-op interview---pt  Lab needs dos----   n/a            Lab results------n/a COVID test -----patient states asymptomatic no test needed Arrive at -------0915 NPO after MN NO Solid Food.  Clear liquids from MN until--- Med rec completed Medications to take morning of surgery -----tylenol, synthroid and prilosec Diabetic medication -----n/a Patient instructed no nail polish to be worn day of surgery Patient instructed to bring photo id and insurance card day of surgery Patient aware to have Driver (ride ) / caregiver husband Nancy Cuevas   for 24 hours after surgery  Patient Special Instructions -----n/a Pre-Op special Istructions ----n/a- Patient verbalized understanding of instructions that were given at this phone interview. Patient denies shortness of breath, chest pain, fever, cough at this phone interview.

## 2022-03-17 NOTE — Progress Notes (Signed)
Called patient to change surgery arrival time to 0830. Spoke with patient, and she is able to arrive at that time.

## 2022-03-18 ENCOUNTER — Ambulatory Visit (HOSPITAL_BASED_OUTPATIENT_CLINIC_OR_DEPARTMENT_OTHER)
Admission: RE | Admit: 2022-03-18 | Discharge: 2022-03-18 | Disposition: A | Discharge status: Home / Self Care | Payer: Medicare PPO | Attending: General Surgery | Admitting: General Surgery

## 2022-03-18 ENCOUNTER — Encounter (HOSPITAL_BASED_OUTPATIENT_CLINIC_OR_DEPARTMENT_OTHER): Payer: Self-pay | Admitting: General Surgery

## 2022-03-18 ENCOUNTER — Encounter (HOSPITAL_BASED_OUTPATIENT_CLINIC_OR_DEPARTMENT_OTHER)
Admission: RE | Disposition: A | Discharge status: Home / Self Care | Payer: Self-pay | Source: Home / Self Care | Attending: General Surgery

## 2022-03-18 ENCOUNTER — Other Ambulatory Visit: Payer: Self-pay

## 2022-03-18 ENCOUNTER — Ambulatory Visit (HOSPITAL_BASED_OUTPATIENT_CLINIC_OR_DEPARTMENT_OTHER): Payer: Medicare PPO | Admitting: Certified Registered"

## 2022-03-18 DIAGNOSIS — M1990 Unspecified osteoarthritis, unspecified site: Secondary | ICD-10-CM

## 2022-03-18 DIAGNOSIS — D128 Benign neoplasm of rectum: Secondary | ICD-10-CM | POA: Diagnosis not present

## 2022-03-18 DIAGNOSIS — K219 Gastro-esophageal reflux disease without esophagitis: Secondary | ICD-10-CM | POA: Insufficient documentation

## 2022-03-18 DIAGNOSIS — E039 Hypothyroidism, unspecified: Secondary | ICD-10-CM

## 2022-03-18 DIAGNOSIS — D375 Neoplasm of uncertain behavior of rectum: Secondary | ICD-10-CM | POA: Diagnosis not present

## 2022-03-18 DIAGNOSIS — K621 Rectal polyp: Secondary | ICD-10-CM | POA: Diagnosis not present

## 2022-03-18 HISTORY — PX: TRANSANAL EXCISION OF RECTAL MASS: SHX6134

## 2022-03-18 HISTORY — DX: COVID-19: U07.1

## 2022-03-18 SURGERY — EXCISION, MASS, RECTUM, ANAL APPROACH
Anesthesia: Monitor Anesthesia Care

## 2022-03-18 MED ORDER — LIDOCAINE HCL (PF) 2 % IJ SOLN
INTRAMUSCULAR | Status: AC
  Filled 2022-03-18: qty 5

## 2022-03-18 MED ORDER — PROPOFOL 500 MG/50ML IV EMUL
INTRAVENOUS | Status: AC
  Filled 2022-03-18: qty 50

## 2022-03-18 MED ORDER — SODIUM CHLORIDE 0.9 % IV SOLN
1.0000 g | INTRAVENOUS | Status: AC
  Administered 2022-03-18: 1 g via INTRAVENOUS
  Filled 2022-03-18: qty 1

## 2022-03-18 MED ORDER — PROPOFOL 10 MG/ML IV BOLUS
INTRAVENOUS | Status: DC | PRN
  Administered 2022-03-18: 40 mg via INTRAVENOUS

## 2022-03-18 MED ORDER — BUPIVACAINE LIPOSOME 1.3 % IJ SUSP: 20.0000 mL | Freq: Once | INTRAMUSCULAR | Status: DC

## 2022-03-18 MED ORDER — ACETAMINOPHEN 500 MG PO TABS
1000.0000 mg | ORAL_TABLET | ORAL | Status: AC
  Administered 2022-03-18: 1000 mg via ORAL

## 2022-03-18 MED ORDER — ACETAMINOPHEN 500 MG PO TABS
ORAL_TABLET | ORAL | Status: AC
  Filled 2022-03-18: qty 2

## 2022-03-18 MED ORDER — LACTATED RINGERS IV SOLN: INTRAVENOUS | Status: DC

## 2022-03-18 MED ORDER — BUPIVACAINE-EPINEPHRINE 0.5% -1:200000 IJ SOLN
INTRAMUSCULAR | Status: DC | PRN
  Administered 2022-03-18: 26 mL

## 2022-03-18 MED ORDER — PROPOFOL 500 MG/50ML IV EMUL
INTRAVENOUS | Status: DC | PRN
  Administered 2022-03-18: 200 ug/kg/min via INTRAVENOUS

## 2022-03-18 MED ORDER — SODIUM CHLORIDE 0.9% FLUSH
3.0000 mL | Freq: Two times a day (BID) | INTRAVENOUS | Status: DC
Start: 2022-03-18 — End: 2022-03-18

## 2022-03-18 MED ORDER — LIDOCAINE 2% (20 MG/ML) 5 ML SYRINGE
INTRAMUSCULAR | Status: DC | PRN
  Administered 2022-03-18: 40 mg via INTRAVENOUS

## 2022-03-18 MED ORDER — TRAMADOL HCL 50 MG PO TABS: 50.0000 mg | ORAL_TABLET | Freq: Four times a day (QID) | ORAL | 0 refills | Status: DC | PRN

## 2022-03-18 SURGICAL SUPPLY — 44 items
APL SKNCLS STERI-STRIP NONHPOA (GAUZE/BANDAGES/DRESSINGS) ×1
BENZOIN TINCTURE PRP APPL 2/3 (GAUZE/BANDAGES/DRESSINGS) ×2 IMPLANT
BLADE EXTENDED COATED 6.5IN (ELECTRODE) ×1 IMPLANT
BLADE SURG 10 STRL SS (BLADE) ×2 IMPLANT
COVER BACK TABLE 60X90IN (DRAPES) ×2 IMPLANT
COVER MAYO STAND STRL (DRAPES) ×2 IMPLANT
DECANTER SPIKE VIAL GLASS SM (MISCELLANEOUS) ×2 IMPLANT
DRAPE HYSTEROSCOPY (MISCELLANEOUS) IMPLANT
DRAPE LAPAROTOMY 100X72 PEDS (DRAPES) ×2 IMPLANT
DRAPE SHEET LG 3/4 BI-LAMINATE (DRAPES) IMPLANT
DRAPE UTILITY XL STRL (DRAPES) ×2 IMPLANT
DRSG PAD ABDOMINAL 8X10 ST (GAUZE/BANDAGES/DRESSINGS) ×2 IMPLANT
ELECT REM PT RETURN 9FT ADLT (ELECTROSURGICAL) ×2
ELECTRODE REM PT RTRN 9FT ADLT (ELECTROSURGICAL) ×1 IMPLANT
GAUZE 4X4 16PLY ~~LOC~~+RFID DBL (SPONGE) ×2 IMPLANT
GAUZE PAD ABD 8X10 STRL (GAUZE/BANDAGES/DRESSINGS) ×1 IMPLANT
GAUZE SPONGE 4X4 12PLY STRL (GAUZE/BANDAGES/DRESSINGS) ×1 IMPLANT
GLOVE BIO SURGEON STRL SZ 6.5 (GLOVE) ×2 IMPLANT
GLOVE INDICATOR 6.5 STRL GRN (GLOVE) ×2 IMPLANT
GOWN STRL REUS W/TWL XL LVL3 (GOWN DISPOSABLE) ×2 IMPLANT
KIT SIGMOIDOSCOPE (SET/KITS/TRAYS/PACK) IMPLANT
KIT TURNOVER CYSTO (KITS) ×2 IMPLANT
LEGGING LITHOTOMY PAIR STRL (DRAPES) IMPLANT
NDL SAFETY ECLIPSE 18X1.5 (NEEDLE) IMPLANT
NEEDLE HYPO 18GX1.5 SHARP (NEEDLE)
NEEDLE HYPO 22GX1.5 SAFETY (NEEDLE) ×2 IMPLANT
NS IRRIG 500ML POUR BTL (IV SOLUTION) ×2 IMPLANT
PACK BASIN DAY SURGERY FS (CUSTOM PROCEDURE TRAY) ×2 IMPLANT
PAD ARMBOARD 7.5X6 YLW CONV (MISCELLANEOUS) IMPLANT
PANTS MESH DISP LRG (UNDERPADS AND DIAPERS) ×1 IMPLANT
PANTS MESH DISPOSABLE L (UNDERPADS AND DIAPERS) ×1
PENCIL SMOKE EVACUATOR (MISCELLANEOUS) ×2 IMPLANT
SPONGE SURGIFOAM ABS GEL 12-7 (HEMOSTASIS) IMPLANT
SUT CHROMIC 2 0 SH (SUTURE) IMPLANT
SUT CHROMIC 3 0 SH 27 (SUTURE) IMPLANT
SUT VIC AB 2-0 SH 27 (SUTURE)
SUT VIC AB 2-0 SH 27XBRD (SUTURE) IMPLANT
SUT VIC AB 3-0 SH 27 (SUTURE) ×4
SUT VIC AB 3-0 SH 27X BRD (SUTURE) IMPLANT
SYR CONTROL 10ML LL (SYRINGE) ×2 IMPLANT
TOWEL OR 17X26 10 PK STRL BLUE (TOWEL DISPOSABLE) ×2 IMPLANT
TRAY DSU PREP LF (CUSTOM PROCEDURE TRAY) ×2 IMPLANT
TUBE CONNECTING 12X1/4 (SUCTIONS) ×2 IMPLANT
YANKAUER SUCT BULB TIP NO VENT (SUCTIONS) ×2 IMPLANT

## 2022-03-18 NOTE — Transfer of Care (Signed)
Immediate Anesthesia Transfer of Care Note  Patient: Nancy Cuevas  Procedure(s) Performed: Procedure(s) (LRB): TRANSANAL EXCISION RECTAL POLYP (N/A)  Patient Location: PACU  Anesthesia Type: General  Level of Consciousness: awake, oriented, sedated and patient cooperative  Airway & Oxygen Therapy: Patient Spontanous Breathing and Patient connected to face mask oxygen  Post-op Assessment: Report given to PACU RN and Post -op Vital signs reviewed and stable  Post vital signs: Reviewed and stable  Complications: No apparent anesthesia complications  Last Vitals:  Vitals Value Taken Time  BP 118/54 03/18/22 1117  Temp    Pulse 73 03/18/22 1119  Resp 22 03/18/22 1119  SpO2 98 % 03/18/22 1119  Vitals shown include unvalidated device data.  Last Pain:  Vitals:   03/18/22 0844  TempSrc: Oral  PainSc: 0-No pain      Patients Stated Pain Goal: 5 (68/34/19 6222)  Complications: No notable events documented.

## 2022-03-18 NOTE — Anesthesia Postprocedure Evaluation (Signed)
Anesthesia Post Note  Patient: Nancy Cuevas  Procedure(s) Performed: TRANSANAL EXCISION RECTAL POLYP     Patient location during evaluation: PACU Anesthesia Type: MAC Level of consciousness: awake and alert Pain management: pain level controlled Vital Signs Assessment: post-procedure vital signs reviewed and stable Respiratory status: spontaneous breathing, nonlabored ventilation and respiratory function stable Cardiovascular status: blood pressure returned to baseline and stable Postop Assessment: no apparent nausea or vomiting Anesthetic complications: no   No notable events documented.  Last Vitals:  Vitals:   03/18/22 1150 03/18/22 1202  BP:  (!) 141/60  Pulse: 77 66  Resp: (!) 22 19  Temp:    SpO2: 100% 99%    Last Pain:  Vitals:   03/18/22 1202  TempSrc:   PainSc: 0-No pain                 Lynda Rainwater

## 2022-03-18 NOTE — Anesthesia Preprocedure Evaluation (Signed)
Anesthesia Evaluation  Patient identified by MRN, date of birth, ID band Patient awake    Reviewed: Allergy & Precautions, H&P , NPO status , Patient's Chart, lab work & pertinent test results  History of Anesthesia Complications Negative for: history of anesthetic complications  Airway Mallampati: I  TM Distance: >3 FB Neck ROM: Full    Dental  (+) Dental Advisory Given   Pulmonary neg pulmonary ROS,    Pulmonary exam normal breath sounds clear to auscultation       Cardiovascular negative cardio ROS Normal cardiovascular exam+ dysrhythmias  Rhythm:Regular Rate:Normal - Systolic murmurs TTE 8299 - Left ventricle: Estimatedejection fraction was in the range of 55% to 60%.  - Mitral valve: Mild regurgitation. - Left atrium: The atrium was mildly dilated. - Atrial septum: There was an atrial septal aneurysm.   Neuro/Psych negative neurological ROS  negative psych ROS   GI/Hepatic Neg liver ROS, GERD  ,  Endo/Other  negative endocrine ROSHypothyroidism   Renal/GU negative Renal ROS  negative genitourinary   Musculoskeletal  (+) Arthritis , Osteoarthritis,    Abdominal   Peds negative pediatric ROS (+)  Hematology negative hematology ROS (+)   Anesthesia Other Findings   Reproductive/Obstetrics negative OB ROS                             Anesthesia Physical  Anesthesia Plan  ASA: II  Anesthesia Plan: MAC   Post-op Pain Management: Minimal or no pain anticipated and Tylenol PO (pre-op)*   Induction: Intravenous  PONV Risk Score and Plan: 2 and Propofol infusion, Treatment may vary due to age or medical condition and Ondansetron  Airway Management Planned: Nasal Cannula and Simple Face Mask  Additional Equipment: None  Intra-op Plan:   Post-operative Plan:   Informed Consent: I have reviewed the patients History and Physical, chart, labs and discussed the procedure  including the risks, benefits and alternatives for the proposed anesthesia with the patient or authorized representative who has indicated his/her understanding and acceptance.     Dental advisory given  Plan Discussed with: CRNA  Anesthesia Plan Comments:         Anesthesia Quick Evaluation

## 2022-03-18 NOTE — Discharge Instructions (Addendum)
Beginning the day after surgery:  You may sit in a tub of warm water 2-3 times a day to relieve discomfort.  Eat a regular diet high in fiber.  Avoid foods that give you constipation or diarrhea.  Avoid foods that are difficult to digest, such as seeds, nuts, corn or popcorn.  Do not go any longer than 2 days without a bowel movement.  You may take a dose of Milk of Magnesia if you become constipated.    Drink 6-8 glasses of water daily.  Walking is encouraged.  Avoid strenuous activity and heavy lifting for one month after surgery.    Call the office if you have any questions or concerns.  Call immediately if you develop:  Excessive rectal bleeding (more than a cup or passing large clots) Increased discomfort Fever greater than 100 F Difficulty urinating  Post Anesthesia Home Care Instructions  Activity: Get plenty of rest for the remainder of the day. A responsible adult should stay with you for 24 hours following the procedure.  For the next 24 hours, DO NOT: -Drive a car -Paediatric nurse -Drink alcoholic beverages -Take any medication unless instructed by your physician -Make any legal decisions or sign important papers.  Meals: Start with liquid foods such as gelatin or soup. Progress to regular foods as tolerated. Avoid greasy, spicy, heavy foods. If nausea and/or vomiting occur, drink only clear liquids until the nausea and/or vomiting subsides. Call your physician if vomiting continues.  Special Instructions/Symptoms: Your throat may feel dry or sore from the anesthesia or the breathing tube placed in your throat during surgery. If this causes discomfort, gargle with warm salt water. The discomfort should disappear within 24 hours.     Information for Discharge Teaching: EXPAREL (bupivacaine liposome injectable suspension)   Your surgeon gave you EXPAREL(bupivacaine) in your surgical incision to help control your pain after surgery.  EXPAREL is a local anesthetic  that provides pain relief by numbing the tissue around the surgical site. EXPAREL is designed to release pain medication over time and can control pain for up to 72 hours. Depending on how you respond to EXPAREL, you may require less pain medication during your recovery.  Possible side effects: Temporary loss of sensation or ability to move in the area where bupivacaine was injected. Nausea, vomiting, constipation Rarely, numbness and tingling in your mouth or lips, lightheadedness, or anxiety may occur. Call your doctor right away if you think you may be experiencing any of these sensations, or if you have other questions regarding possible side effects.  Follow all other discharge instructions given to you by your surgeon or nurse. Eat a healthy diet and drink plenty of water or other fluids.  If you return to the hospital for any reason within 96 hours following the administration of EXPAREL, please inform your health care providers.

## 2022-03-18 NOTE — Interval H&P Note (Signed)
History and Physical Interval Note:  03/18/2022 9:04 AM  Nancy Cuevas  has presented today for surgery, with the diagnosis of RECTAL POLYP.  The various methods of treatment have been discussed with the patient and family. After consideration of risks, benefits and other options for treatment, the patient has consented to  Procedure(s): TRANSANAL EXCISION RECTAL POLYP (N/A) as a surgical intervention.  The patient's history has been reviewed, patient examined, no change in status, stable for surgery.  I have reviewed the patient's chart and labs.  Questions were answered to the patient's satisfaction.     Rosario Adie, MD  Colorectal and [place] Surgery

## 2022-03-18 NOTE — Op Note (Signed)
03/18/2022  11:08 AM  PATIENT:  Nancy Cuevas  76 y.o. female  Patient Care Team: Haydee Salter, MD as PCP - General (Family Medicine) Gatha Mayer, MD as Consulting Physician (Gastroenterology) Marygrace Drought, MD as Consulting Physician (Ophthalmology) Larey Dresser, MD as Consulting Physician (Cardiology) Ladell Pier, DDS as Consulting Physician (Dentistry) Linus Mako, MD as Consulting Physician  PRE-OPERATIVE DIAGNOSIS:  RECTAL POLYP  POST-OPERATIVE DIAGNOSIS:  RECTAL POLYP  PROCEDURE:  TRANSANAL EXCISION RECTAL POLYP    Surgeon(s): Leighton Ruff, MD  ASSISTANT: none   ANESTHESIA:   local and MAC  SPECIMEN:  Source of Specimen:  anterior midline distal rectal polyp  DISPOSITION OF SPECIMEN:  PATHOLOGY  COUNTS:  YES  PLAN OF CARE: Discharge to home after PACU  PATIENT DISPOSITION:  PACU - hemodynamically stable.  INDICATION: 76 y.o. F with recurrent distal rectal polyp   OR FINDINGS: large distal rectal polyp, minimal scarring  DESCRIPTION: the patient was identified in the preoperative holding area and taken to the OR where they were laid on the operating room table.  MAC anesthesia was induced without difficulty. The patient was then positioned in prone jackknife position with buttocks gently taped apart.  The patient was then prepped and draped in usual sterile fashion.  SCDs were noted to be in place prior to the initiation of anesthesia. A surgical timeout was performed indicating the correct patient, procedure, positioning and need for preoperative antibiotics.  A rectal block was performed using Marcaine with epinephrine.    I began with a digital rectal exam.  The mass could be palpated at anterior midline.  I then placed a Hill-Ferguson anoscope into the anal canal and evaluated this completely.   It extended to the left lateral anal distal rectum and the right posterior lateral distal rectum.  I marked out 5 mm margins circumferentially using  Bovie electrocautery.  I placed a 3-0 Vicryl stitch in the proximal border.  I used the Marcaine epinephrine mixture to create a plane underneath the polyp and lifted up.  I took a full-thickness biopsy beginning at the dentate line and coming around the entire polyp.  Electrocautery was used to obtain hemostasis.  There was minimal scarring at the middle portion of the polypectomy site.  Once the entire polyp was removed, it was oriented on 2 a cork board and sent to pathology for further examination.  The edges of the polypectomy site were cauterized for hemostasis and for additional margin.  I then used the previous 3-0 Vicryl suture to secure the proximal rectum to the dentate line.  I then ran this suture to the left lateral border horizontally.  A 3-0 Vicryl suture was also used to run the right lateral polypectomy site closed.  Once this was complete additional Marcaine was placed for postoperative pain control.  Hemostasis was good.  A dressing was applied.  The patient was then awakened from anesthesia and sent to the postanesthesia care unit in stable condition.  All counts were correct per operating room staff.  Rosario Adie, MD  Colorectal and [place] [facility]

## 2022-03-19 ENCOUNTER — Encounter (HOSPITAL_BASED_OUTPATIENT_CLINIC_OR_DEPARTMENT_OTHER): Payer: Self-pay | Admitting: General Surgery

## 2022-03-22 LAB — SURGICAL PATHOLOGY

## 2022-05-10 ENCOUNTER — Ambulatory Visit: Payer: Medicare PPO | Admitting: Family Medicine

## 2022-05-10 VITALS — BP 124/28 | HR 65 | Temp 97.1°F | Ht 64.0 in | Wt 132.2 lb

## 2022-05-10 DIAGNOSIS — R519 Headache, unspecified: Secondary | ICD-10-CM

## 2022-05-10 NOTE — Progress Notes (Signed)
[facility] [address] [place] [address] Dept: [phone] Dept Fax: [phone]  Office Visit  Subjective:    Patient ID: Nancy Cuevas, female    DOB: [birth year], 76 y.o..   MRN: [id number]  Chief Complaint  Patient presents with   Acute Visit    Discomfort in temporal area and "behind eyes". Wednesday felt extreme pressure & aches in head, nausea, lasted to Thursday. Mother had a stroke at 61. On & off pressure in head X5 days.    History of Present Illness:  Patient is in today for assessment of a recent headache. She notes she and her husband were visiting their house int he [place] area last week. On Wed., after going out for a hike, she experienced sudden onset of a frontal/temporal headache. This was associated with chills and nausea. On returning to her house, she rested and took Tylenol, which seemed to help. However, over the next few days, she had recurrent episodes of headache. She took 325 mg of aspirin which seemed to help as well. She denies any speech difficulty, dysequilibrium, recurrent vomiting, numbness or weakness in either arm or leg. She had no change in her hearing. She has had some nasal congestion and sneezing over the past weeks/months. She is not on any specific allergy medication, though she has used these in the past. She had a fear related to her symptoms, as her mother died of complications of a stroke that initially presented as a headache.  Past Medical History: Patient Active Problem List   Diagnosis Date Noted   Recurrent rectal polyp 03/05/2022   Low iron stores 09/29/2021   Allergic rhinitis 09/29/2021   Varicose veins of both lower extremities 08/21/2021   Aortic atherosclerosis ([place]) 08/12/2021   Hiatal hernia 08/12/2021   Hematuria 06/18/2021   Atrial tachycardia ([place]) 09/04/2019   Stricture and stenosis of esophagus 09/04/2019   Osteopenia 09/04/2019   History of  colonic polyps 03/22/2013   Hyperlipidemia 10/27/2010   Female stress incontinence 10/27/2010   Hypothyroidism 07/18/2007   Past Surgical History:  Procedure Laterality Date   ABDOMINAL HYSTERECTOMY  1988   prolapse uterus   BUNIONECTOMY Right 10/2014   CARPAL TUNNEL RELEASE  11/15/2011   Procedure: CARPAL TUNNEL RELEASE;  Surgeon: Tennis Must, MD;  Location: [facility];  Service: Orthopedics;  Laterality: Left;   CATARACT EXTRACTION Left 2011   COLONOSCOPY  2003,2014   normal,   COLONOSCOPY WITH PROPOFOL N/A 06/24/2017   Procedure: COLONOSCOPY WITH PROPOFOL;  Surgeon: Gatha Mayer, MD;  Location: [facility];  Service: Endoscopy;  Laterality: N/A;   EYE SURGERY     FRACTURE SURGERY  2006   HAND SURGERY  oct 2012 and jan 2013   Dr Dean--[facility]   HOT HEMOSTASIS N/A 06/24/2017   Procedure: HOT HEMOSTASIS (ARGON PLASMA COAGULATION/BICAP);  Surgeon: Gatha Mayer, MD;  Location: [facility];  Service: Endoscopy;  Laterality: N/A;   left wrist hardware removal 10/12  07/2011   OPEN REDUCTION INTERNAL FIXATION (ORIF) DISTAL RADIAL FRACTURE Left 03/2005   left wrist   PARTIAL PROCTECTOMY BY TEM  06/22/2013   Procedure: PARTIAL PROCTECTOMY BY TEM;  Surgeon: Leighton Ruff, MD;  Location: [facility];  Service: General;;   POLYPECTOMY     RETINAL DETACHMENT SURGERY  2009   left   TRANSANAL EXCISION OF RECTAL MASS N/A 03/18/2022   Procedure: TRANSANAL EXCISION RECTAL POLYP;  Surgeon: Leighton Ruff, MD;  Location: [facility];  Service: General;  Laterality: N/A;   Family History  Problem Relation Age of Onset   Diabetes Mother        1st degree relative   Hypertension Mother    Stroke Mother    Heart disease Father    Cancer Maternal Aunt    Heart disease Maternal Uncle    ALS Paternal Aunt    Diabetes Paternal Uncle    Heart disease Maternal Grandfather    Heart disease Paternal Grandfather    Cancer Cousin    Heart disease Cousin     Alzheimer's disease Cousin    Osteoporosis Other        1st degree relative   Colon cancer Neg Hx    Esophageal cancer Neg Hx    Stomach cancer Neg Hx    Rectal cancer Neg Hx    Colon polyps Neg Hx    Crohn's disease Neg Hx    Outpatient Medications Prior to Visit  Medication Sig Dispense Refill   acetaminophen (TYLENOL) 500 MG tablet Take 500 mg by mouth daily as needed for moderate pain or headache.     aspirin 81 MG tablet Take 81 mg by mouth every other day.     CALCIUM-MAGNESIUM-VITAMIN D PO Take 1 tablet by mouth daily.     desonide (DESOWEN) 0.05 % cream Apply topically 2 (two) times daily. (Patient taking differently: Apply topically as needed.) 30 g 0   ferrous sulfate 325 (65 FE) MG tablet Take 325 mg by mouth daily.     Flaxseed, Linseed, (FLAX SEED OIL) 1000 MG CAPS Take 1,000 mg by mouth daily. evening     fluticasone (FLONASE) 50 MCG/ACT nasal spray Place 2 sprays into both nostrils daily. (Patient taking differently: Place 2 sprays into both nostrils as needed.) 16 g 1   Hypromellose (ARTIFICIAL TEARS OP) Apply 1 drop to eye daily as needed (dry eyes).     levothyroxine (SYNTHROID) 75 MCG tablet Take 1 tablet (75 mcg total) by mouth daily. 90 tablet 3   Multiple Vitamin (MULTIVITAMIN) tablet Take 1 tablet by mouth daily.     Omega-3 Fatty Acids (FISH OIL) 1000 MG CAPS Take 1 capsule by mouth 2 (two) times daily.      omeprazole (PRILOSEC) 20 MG capsule TAKE 1 CAPSULE BY MOUTH DAILY (Patient taking differently: 20 mg. TAKE 1 CAPSULE BY MOUTH DAILY) 90 capsule 3   pravastatin (PRAVACHOL) 40 MG tablet TAKE 1 TABLET BY MOUTH EVERY DAY (Patient taking differently: Take 40 mg by mouth daily. TAKE 1 TABLET BY MOUTH EVERY DAY evening) 90 tablet 3   traMADol (ULTRAM) 50 MG tablet Take 1 tablet (50 mg total) by mouth every 6 (six) hours as needed. (Patient not taking: Reported on 05/10/2022) 20 tablet 0   No facility-administered medications prior to visit.   Allergies  Allergen  Reactions   Sulfonamide Derivatives Rash   Objective:   Today's Vitals   05/10/22 1111  BP: (!) 124/28  Pulse: 65  Temp: (!) 97.1 F (36.2 C)  TempSrc: Temporal  SpO2: 94%  Weight: 132 lb 3.2 oz (60 kg)  Height: '5\' 4"'$  (1.626 m)   Body mass index is 22.69 kg/m.   General: Well developed, well nourished. No acute distress. HEENT: Normocephalic, non-traumatic. PERRL, EOMI. Conjunctiva clear. Fundiscopic exam difficult due to   cataracts. External ears normal. EAC and TMs normal bilaterally. Nose with moderate congestion, but no   rhinorrhea. Mucous membranes moist. Oropharynx clear. Good  dentition. Neck: Supple. No lymphadenopathy. No thyromegaly. Neuro: CN II-XII intact. Normal sensation, equal grip strength bilaterally. Psych: Alert and oriented. Normal mood and affect.  Health Maintenance Due  Topic Date Due   TETANUS/TDAP  02/06/2018   Zoster Vaccines- Shingrix (2 of 2) 10/01/2019   COVID-19 Vaccine (4 - Pfizer series) 10/27/2020     Assessment & Plan:   1. Acute nonintractable headache, unspecified headache type I reassured Ms. Larsen that I do not see any signs of a stroke. Her headache is nonspecific. This could have been due to sinus pressure related to her nasal congestion and the [place]. It also could have represented a viral infection. Her symptoms and exam do not point towards any other specific headache cause at this point. We will observe and she will follow up if persistent or new symptoms develop. I recommend she consider a nasal steroid spray or daily antihistamine to see if this will improve the congestion issue.  Return if symptoms worsen or fail to improve.   Haydee Salter, MD

## 2022-06-03 ENCOUNTER — Ambulatory Visit: Payer: Medicare PPO | Admitting: Family Medicine

## 2022-06-03 ENCOUNTER — Encounter: Payer: Self-pay | Admitting: Family Medicine

## 2022-06-03 VITALS — BP 148/72 | HR 74 | Temp 97.5°F | Ht 64.0 in | Wt 133.4 lb

## 2022-06-03 DIAGNOSIS — J069 Acute upper respiratory infection, unspecified: Secondary | ICD-10-CM | POA: Diagnosis not present

## 2022-06-03 NOTE — Progress Notes (Signed)
[facility] [address] [place] [address] Dept: [phone] Dept Fax: [phone]  Office Visit  Subjective:    Patient ID: Nancy Cuevas, female    DOB: April 24, 1946, 76 y.o..   MRN: [id number]  Chief Complaint  Patient presents with   Sore Throat    Pt c/o coughing ,soar throat ,sinus drainage x 1 1/2 week. Tylenol for symptoms. Took home covid test with neg results.    History of Present Illness:  Patient is in today for evaluation of a recent respiratory illness. Nancy Cuevas notes she had been having symptoms for about 10 days, primarily involving episodes of cough, sore throat, and mild sinus drainage. She denies any fever/chills. She used only Tylenol for symptom management. As her cough was persistent, she had made the appointment, but today, notes she is feeling improved. She had performed a home COVID test which was negative.  Past Medical History: Patient Active Problem List   Diagnosis Date Noted   Recurrent rectal polyp 03/05/2022   Low iron stores 09/29/2021   Allergic rhinitis 09/29/2021   Varicose veins of both lower extremities 08/21/2021   Aortic atherosclerosis ([place]) 08/12/2021   Hiatal hernia 08/12/2021   Hematuria 06/18/2021   Atrial tachycardia ([place]) 09/04/2019   Stricture and stenosis of esophagus 09/04/2019   Osteopenia 09/04/2019   History of colonic polyps 03/22/2013   Hyperlipidemia 10/27/2010   Female stress incontinence 10/27/2010   Hypothyroidism 07/18/2007   Past Surgical History:  Procedure Laterality Date   ABDOMINAL HYSTERECTOMY  1988   prolapse uterus   BUNIONECTOMY Right 10/2014   CARPAL TUNNEL RELEASE  11/15/2011   Procedure: CARPAL TUNNEL RELEASE;  Surgeon: Tennis Must, MD;  Location: [facility];  Service: Orthopedics;  Laterality: Left;   CATARACT EXTRACTION Left 2011   COLONOSCOPY  2003,2014   normal,   COLONOSCOPY WITH PROPOFOL N/A 06/24/2017    Procedure: COLONOSCOPY WITH PROPOFOL;  Surgeon: Gatha Mayer, MD;  Location: [facility];  Service: Endoscopy;  Laterality: N/A;   EYE SURGERY     FRACTURE SURGERY  2006   HAND SURGERY  oct 2012 and jan 2013   Dr Dean--[facility]   HOT HEMOSTASIS N/A 06/24/2017   Procedure: HOT HEMOSTASIS (ARGON PLASMA COAGULATION/BICAP);  Surgeon: Gatha Mayer, MD;  Location: [facility];  Service: Endoscopy;  Laterality: N/A;   left wrist hardware removal 10/12  07/2011   OPEN REDUCTION INTERNAL FIXATION (ORIF) DISTAL RADIAL FRACTURE Left 03/2005   left wrist   PARTIAL PROCTECTOMY BY TEM  06/22/2013   Procedure: PARTIAL PROCTECTOMY BY TEM;  Surgeon: Leighton Ruff, MD;  Location: [facility];  Service: General;;   POLYPECTOMY     RETINAL DETACHMENT SURGERY  2009   left   TRANSANAL EXCISION OF RECTAL MASS N/A 03/18/2022   Procedure: TRANSANAL EXCISION RECTAL POLYP;  Surgeon: Leighton Ruff, MD;  Location: [facility];  Service: General;  Laterality: N/A;   Family History  Problem Relation Age of Onset   Diabetes Mother        1st degree relative   Hypertension Mother    Stroke Mother    Heart disease Father    Cancer Maternal Aunt    Heart disease Maternal Uncle    ALS Paternal Aunt    Diabetes Paternal Uncle    Heart disease Maternal Grandfather    Heart disease Paternal Grandfather    Cancer Cousin    Heart disease Cousin  Alzheimer's disease Cousin    Osteoporosis Other        1st degree relative   Colon cancer Neg Hx    Esophageal cancer Neg Hx    Stomach cancer Neg Hx    Rectal cancer Neg Hx    Colon polyps Neg Hx    Crohn's disease Neg Hx    Outpatient Medications Prior to Visit  Medication Sig Dispense Refill   acetaminophen (TYLENOL) 500 MG tablet Take 500 mg by mouth daily as needed for moderate pain or headache.     aspirin 81 MG tablet Take 81 mg by mouth every other day.     CALCIUM-MAGNESIUM-VITAMIN D PO Take 1 tablet by mouth daily.     desonide  (DESOWEN) 0.05 % cream Apply topically 2 (two) times daily. (Patient taking differently: Apply topically as needed.) 30 g 0   ferrous sulfate 325 (65 FE) MG tablet Take 325 mg by mouth daily.     Flaxseed, Linseed, (FLAX SEED OIL) 1000 MG CAPS Take 1,000 mg by mouth daily. evening     fluticasone (FLONASE) 50 MCG/ACT nasal spray Place 2 sprays into both nostrils daily. (Patient taking differently: Place 2 sprays into both nostrils as needed.) 16 g 1   Hypromellose (ARTIFICIAL TEARS OP) Apply 1 drop to eye daily as needed (dry eyes).     levothyroxine (SYNTHROID) 75 MCG tablet Take 1 tablet (75 mcg total) by mouth daily. 90 tablet 3   Multiple Vitamin (MULTIVITAMIN) tablet Take 1 tablet by mouth daily.     Omega-3 Fatty Acids (FISH OIL) 1000 MG CAPS Take 1 capsule by mouth 2 (two) times daily.      omeprazole (PRILOSEC) 20 MG capsule TAKE 1 CAPSULE BY MOUTH DAILY (Patient taking differently: 20 mg. TAKE 1 CAPSULE BY MOUTH DAILY) 90 capsule 3   pravastatin (PRAVACHOL) 40 MG tablet TAKE 1 TABLET BY MOUTH EVERY DAY (Patient taking differently: Take 40 mg by mouth daily. TAKE 1 TABLET BY MOUTH EVERY DAY evening) 90 tablet 3   traMADol (ULTRAM) 50 MG tablet Take 1 tablet (50 mg total) by mouth every 6 (six) hours as needed. 20 tablet 0   No facility-administered medications prior to visit.   Allergies  Allergen Reactions   Sulfonamide Derivatives Rash     Objective:   Today's Vitals   06/03/22 1107  BP: (!) 148/72  Pulse: 74  Temp: (!) 97.5 F (36.4 C)  TempSrc: Temporal  SpO2: 98%  Weight: 133 lb 6.4 oz (60.5 kg)  Height: '5\' 4"'$  (1.626 m)   Body mass index is 22.9 kg/m.   General: Well developed, well nourished. No acute distress. HEENT: Normocephalic, non-traumatic. PERRL, EOMI. Conjunctiva clear. External ears normal. Right   EAC impacted with wax, easily removed with a curette. Right TM mild congested. Left EAC and TM   normal. Nose clear without congestion or rhinorrhea. Mucous  membranes moist. Oropharynx clear.    Good dentition. Neck: Supple. No lymphadenopathy. No thyromegaly. Lungs: Clear to auscultation bilaterally. No wheezing, rales or rhonchi. Psych: Alert and oriented. Normal mood and affect.  Health Maintenance Due  Topic Date Due   TETANUS/TDAP  02/06/2018   Zoster Vaccines- Shingrix (2 of 2) 10/01/2019   COVID-19 Vaccine (4 - Pfizer series) 10/27/2020   INFLUENZA VACCINE  05/18/2022     Assessment & Plan:   1. Viral URI with cough This appears resolved a this point. I recommend observation and she should return if her symptoms recur.   Return if  symptoms worsen or fail to improve.   Haydee Salter, MD

## 2022-06-09 ENCOUNTER — Other Ambulatory Visit: Payer: Self-pay

## 2022-06-09 DIAGNOSIS — E785 Hyperlipidemia, unspecified: Secondary | ICD-10-CM

## 2022-06-09 DIAGNOSIS — R1310 Dysphagia, unspecified: Secondary | ICD-10-CM

## 2022-06-09 MED ORDER — OMEPRAZOLE 20 MG PO CPDR: 20.0000 mg | DELAYED_RELEASE_CAPSULE | Freq: Every day | ORAL | 3 refills | Status: DC

## 2022-06-09 MED ORDER — PRAVASTATIN SODIUM 40 MG PO TABS: 40.0000 mg | ORAL_TABLET | Freq: Every day | ORAL | 3 refills | Status: DC

## 2022-06-09 NOTE — Telephone Encounter (Signed)
Refill request for  Omeprazole '20mg'$  LR 04/01/21, #90, 3 rf  Pravastatin  40 mg LR 04/01/21, #90, 3 rf LOV  7/224/23 FOV none scheduled.  Please review and advise.  Thanks. Dm/cma

## 2022-09-06 ENCOUNTER — Other Ambulatory Visit: Payer: Self-pay | Admitting: Family Medicine

## 2022-09-06 DIAGNOSIS — E039 Hypothyroidism, unspecified: Secondary | ICD-10-CM

## 2022-10-14 DIAGNOSIS — R0982 Postnasal drip: Secondary | ICD-10-CM | POA: Diagnosis not present

## 2022-10-14 DIAGNOSIS — R051 Acute cough: Secondary | ICD-10-CM | POA: Diagnosis not present

## 2022-10-14 DIAGNOSIS — J069 Acute upper respiratory infection, unspecified: Secondary | ICD-10-CM | POA: Diagnosis not present

## 2022-11-12 ENCOUNTER — Other Ambulatory Visit: Payer: Self-pay

## 2022-11-12 DIAGNOSIS — E039 Hypothyroidism, unspecified: Secondary | ICD-10-CM

## 2022-11-12 DIAGNOSIS — R1310 Dysphagia, unspecified: Secondary | ICD-10-CM

## 2022-11-12 DIAGNOSIS — E785 Hyperlipidemia, unspecified: Secondary | ICD-10-CM

## 2022-11-12 MED ORDER — PRAVASTATIN SODIUM 40 MG PO TABS: 40.0000 mg | ORAL_TABLET | Freq: Every day | ORAL | 3 refills | Status: DC

## 2022-11-12 MED ORDER — LEVOTHYROXINE SODIUM 75 MCG PO TABS: ORAL_TABLET | ORAL | 3 refills | Status: DC

## 2022-11-12 MED ORDER — OMEPRAZOLE 20 MG PO CPDR: 20.0000 mg | DELAYED_RELEASE_CAPSULE | Freq: Every day | ORAL | 3 refills | Status: DC

## 2022-11-22 ENCOUNTER — Ambulatory Visit (INDEPENDENT_AMBULATORY_CARE_PROVIDER_SITE_OTHER): Payer: Medicare PPO

## 2022-11-22 VITALS — Ht 63.5 in | Wt 137.0 lb

## 2022-11-22 DIAGNOSIS — Z0000 Encounter for general adult medical examination without abnormal findings: Secondary | ICD-10-CM

## 2022-11-22 NOTE — Patient Instructions (Signed)
Nancy Cuevas , Thank you for taking time to come for your Medicare Wellness Visit. I appreciate your ongoing commitment to your health goals. Please review the following plan we discussed and let me know if I can assist you in the future.   These are the goals we discussed:  Goals      Increase physical activity     Continue current exercise regimen and start going to the [facility] 3x weekly.     Patient Stated     11/22/2022, wants to do extra exercise three times a week        This is a list of the screening recommended for you and due dates:  Health Maintenance  Topic Date Due   DTaP/Tdap/Td vaccine (2 - Tdap) 02/06/2018   Flu Shot  05/18/2022   COVID-19 Vaccine (4 - 2023-24 season) 06/18/2022   Mammogram  12/14/2022   Colon Cancer Screening  02/19/2023   Medicare Annual Wellness Visit  11/23/2023   DEXA scan (bone density measurement)  12/10/2023   Pneumonia Vaccine  Completed   Hepatitis C Screening: USPSTF Recommendation to screen - Ages 18-79 yo.  Completed   Zoster (Shingles) Vaccine  Completed   HPV Vaccine  Aged Out    Advanced directives: Please bring a copy of your POA (Power of [place]) and/or Living Will to your next appointment.   Conditions/risks identified: none  Next appointment: Follow up in one year for your annual wellness visit    Preventive Care 65 Years and Older, Female Preventive care refers to lifestyle choices and visits with your health care provider that can promote health and wellness. What does preventive care include? A yearly physical exam. This is also called an annual well check. Dental exams once or twice a year. Routine eye exams. Ask your health care provider how often you should have your eyes checked. Personal lifestyle choices, including: Daily care of your teeth and gums. Regular physical activity. Eating a healthy diet. Avoiding tobacco and drug use. Limiting alcohol use. Practicing safe sex. Taking low-dose aspirin every  day. Taking vitamin and mineral supplements as recommended by your health care provider. What happens during an annual well check? The services and screenings done by your health care provider during your annual well check will depend on your age, overall health, lifestyle risk factors, and family history of disease. Counseling  Your health care provider may ask you questions about your: Alcohol use. Tobacco use. Drug use. Emotional well-being. Home and relationship well-being. Sexual activity. Eating habits. History of falls. Memory and ability to understand (cognition). Work and work Statistician. Reproductive health. Screening  You may have the following tests or measurements: Height, weight, and BMI. Blood pressure. Lipid and cholesterol levels. These may be checked every 5 years, or more frequently if you are over 64 years old. Skin check. Lung cancer screening. You may have this screening every year starting at age 7 if you have a 30-pack-year history of smoking and currently smoke or have quit within the past 15 years. Fecal occult blood test (FOBT) of the stool. You may have this test every year starting at age 29. Flexible sigmoidoscopy or colonoscopy. You may have a sigmoidoscopy every 5 years or a colonoscopy every 10 years starting at age 88. Hepatitis C blood test. Hepatitis B blood test. Sexually transmitted disease (STD) testing. Diabetes screening. This is done by checking your blood sugar (glucose) after you have not eaten for a while (fasting). You may have this done every 1-3  years. Bone density scan. This is done to screen for osteoporosis. You may have this done starting at age 72. Mammogram. This may be done every 1-2 years. Talk to your health care provider about how often you should have regular mammograms. Talk with your health care provider about your test results, treatment options, and if necessary, the need for more tests. Vaccines  Your health care  provider may recommend certain vaccines, such as: Influenza vaccine. This is recommended every year. Tetanus, diphtheria, and acellular pertussis (Tdap, Td) vaccine. You may need a Td booster every 10 years. Zoster vaccine. You may need this after age 27. Pneumococcal 13-valent conjugate (PCV13) vaccine. One dose is recommended after age 21. Pneumococcal polysaccharide (PPSV23) vaccine. One dose is recommended after age 75. Talk to your health care provider about which screenings and vaccines you need and how often you need them. This information is not intended to replace advice given to you by your health care provider. Make sure you discuss any questions you have with your health care provider. Document Released: 10/31/2015 Document Revised: 06/23/2016 Document Reviewed: 08/05/2015 Elsevier Interactive Patient Education  2017 [employer] Prevention in the Home Falls can cause injuries. They can happen to people of all ages. There are many things you can do to make your home safe and to help prevent falls. What can I do on the outside of my home? Regularly fix the edges of walkways and driveways and fix any cracks. Remove anything that might make you trip as you walk through a door, such as a raised step or threshold. Trim any bushes or trees on the path to your home. Use bright outdoor lighting. Clear any walking paths of anything that might make someone trip, such as rocks or tools. Regularly check to see if handrails are loose or broken. Make sure that both sides of any steps have handrails. Any raised decks and porches should have guardrails on the edges. Have any leaves, snow, or ice cleared regularly. Use sand or salt on walking paths during winter. Clean up any spills in your garage right away. This includes oil or grease spills. What can I do in the bathroom? Use night lights. Install grab bars by the toilet and in the tub and shower. Do not use towel bars as grab  bars. Use non-skid mats or decals in the tub or shower. If you need to sit down in the shower, use a plastic, non-slip stool. Keep the floor dry. Clean up any water that spills on the floor as soon as it happens. Remove soap buildup in the tub or shower regularly. Attach bath mats securely with double-sided non-slip rug tape. Do not have throw rugs and other things on the floor that can make you trip. What can I do in the bedroom? Use night lights. Make sure that you have a light by your bed that is easy to reach. Do not use any sheets or blankets that are too big for your bed. They should not hang down onto the floor. Have a firm chair that has side arms. You can use this for support while you get dressed. Do not have throw rugs and other things on the floor that can make you trip. What can I do in the kitchen? Clean up any spills right away. Avoid walking on wet floors. Keep items that you use a lot in easy-to-reach places. If you need to reach something above you, use a strong step stool that has a grab  bar. Keep electrical cords out of the way. Do not use floor polish or wax that makes floors slippery. If you must use wax, use non-skid floor wax. Do not have throw rugs and other things on the floor that can make you trip. What can I do with my stairs? Do not leave any items on the stairs. Make sure that there are handrails on both sides of the stairs and use them. Fix handrails that are broken or loose. Make sure that handrails are as long as the stairways. Check any carpeting to make sure that it is firmly attached to the stairs. Fix any carpet that is loose or worn. Avoid having throw rugs at the top or bottom of the stairs. If you do have throw rugs, attach them to the floor with carpet tape. Make sure that you have a light switch at the top of the stairs and the bottom of the stairs. If you do not have them, ask someone to add them for you. What else can I do to help prevent  falls? Wear shoes that: Do not have high heels. Have rubber bottoms. Are comfortable and fit you well. Are closed at the toe. Do not wear sandals. If you use a stepladder: Make sure that it is fully opened. Do not climb a closed stepladder. Make sure that both sides of the stepladder are locked into place. Ask someone to hold it for you, if possible. Clearly mark and make sure that you can see: Any grab bars or handrails. First and last steps. Where the edge of each step is. Use tools that help you move around (mobility aids) if they are needed. These include: Canes. Walkers. Scooters. Crutches. Turn on the lights when you go into a dark area. Replace any light bulbs as soon as they burn out. Set up your furniture so you have a clear path. Avoid moving your furniture around. If any of your floors are uneven, fix them. If there are any pets around you, be aware of where they are. Review your medicines with your doctor. Some medicines can make you feel dizzy. This can increase your chance of falling. Ask your doctor what other things that you can do to help prevent falls. This information is not intended to replace advice given to you by your health care provider. Make sure you discuss any questions you have with your health care provider. Document Released: 07/31/2009 Document Revised: 03/11/2016 Document Reviewed: 11/08/2014 Elsevier Interactive Patient Education  2017 [employer].

## 2022-11-22 NOTE — Progress Notes (Signed)
I connected with Nancy Cuevas today by telephone and verified that I am speaking with the correct person using two identifiers. Location patient: home Location provider: work Persons participating in the virtual visit: Suzetta Timko, Glenna Durand LPN.   I discussed the limitations, risks, security and privacy concerns of performing an evaluation and management service by telephone and the availability of in person appointments. I also discussed with the patient that there may be a patient responsible charge related to this service. The patient expressed understanding and verbally consented to this telephonic visit.    Interactive audio and video telecommunications were attempted between this provider and patient, however failed, due to patient having technical difficulties OR patient did not have access to video capability.  We continued and completed visit with audio only.     Vital signs may be patient reported or missing.  Subjective:   Nancy Cuevas is a 77 y.o. female who presents for Medicare Annual (Subsequent) preventive examination.  Review of Systems     Cardiac Risk Factors include: advanced age (>73mn, >>4women);dyslipidemia     Objective:    Today's Vitals   11/22/22 1552  Weight: 137 lb (62.1 kg)  Height: 5' 3.5" (1.613 m)   Body mass index is 23.89 kg/m.     11/22/2022    3:56 PM 03/18/2022    8:42 AM 11/18/2021    3:59 PM 10/07/2020   10:37 AM 06/05/2018    3:06 PM 06/24/2017    8:31 AM 06/10/2017   12:42 PM  Advanced Directives  Does Patient Have a Medical Advance Directive? Yes Yes Yes Yes Yes Yes Yes  Type of AParamedicof ACateecheeLiving will [place]Living will HCasselmanLiving will HIsle of WightLiving will HOcracokeLiving will HSaffordLiving will Living will;Healthcare Power of Attorney  Does patient want to make changes to medical advance  directive?       No - Patient declined  Copy of [place]in Chart? No - copy requested No - copy requested No - copy requested Yes - validated most recent copy scanned in chart (See row information) Yes No - copy requested No - copy requested    Current Medications (verified) Outpatient Encounter Medications as of 11/22/2022  Medication Sig   acetaminophen (TYLENOL) 500 MG tablet Take 500 mg by mouth daily as needed for moderate pain or headache.   CALCIUM-MAGNESIUM-VITAMIN D PO Take 1 tablet by mouth daily.   desonide (DESOWEN) 0.05 % cream Apply topically 2 (two) times daily. (Patient taking differently: Apply topically as needed.)   ferrous sulfate 325 (65 FE) MG tablet Take 325 mg by mouth daily.   Flaxseed, Linseed, (FLAX SEED OIL) 1000 MG CAPS Take 1,000 mg by mouth daily. evening   fluticasone (FLONASE) 50 MCG/ACT nasal spray Place 2 sprays into both nostrils daily. (Patient taking differently: Place 2 sprays into both nostrils as needed.)   Hypromellose (ARTIFICIAL TEARS OP) Apply 1 drop to eye daily as needed (dry eyes).   levothyroxine (SYNTHROID) 75 MCG tablet TAKE 1 TABLET(75 MCG) BY MOUTH DAILY   Multiple Vitamin (MULTIVITAMIN) tablet Take 1 tablet by mouth daily.   Omega-3 Fatty Acids (FISH OIL) 1000 MG CAPS Take 1 capsule by mouth 2 (two) times daily.    omeprazole (PRILOSEC) 20 MG capsule Take 1 capsule (20 mg total) by mouth daily.   pravastatin (PRAVACHOL) 40 MG tablet Take 1 tablet (40 mg total)  by mouth daily.   aspirin 81 MG tablet Take 81 mg by mouth every other day. (Patient not taking: Reported on 11/22/2022)   traMADol (ULTRAM) 50 MG tablet Take 1 tablet (50 mg total) by mouth every 6 (six) hours as needed. (Patient not taking: Reported on 11/22/2022)   No facility-administered encounter medications on file as of 11/22/2022.    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   Bunion    Cataract    removed from left eye   Contact dermatitis and other eczema, due to unspecified cause    Contact dermatitis and other eczema, due to unspecified cause    COVID    Oct 2022 coughing tired cold s/s   Disorder of bone and cartilage, unspecified    Female stress incontinence    GERD (gastroesophageal reflux disease)    HYPERLIPIDEMIA 10/27/2010   Hyperpotassemia    HYPOTHYROIDISM 07/18/2007   Nonspecific (abnormal) findings on radiological and other examination of other intrathoracic organs    OSTEOPENIA 03/24/2008   Osteopenia    Osteoporosis    Palpitations    Echo (1/12) EF 47-42%, mild diastolic dysfunction, atrial septal aneurysm. Holter (1/12): short runs of atrial tachycardia versus MAT. ;  Echo (14) EF 55-60%, normal wall motion, mild MR, mild LAE, atrial septal aneurysm   Personal history of colonic and rectal adenomas 03/22/2013   POSTMENOPAUSAL STATUS 02/07/2008   Symptomatic menopausal or female climacteric states    Past Surgical History:  Procedure Laterality Date   ABDOMINAL HYSTERECTOMY  1988   prolapse uterus   BUNIONECTOMY Right 10/2014   CARPAL TUNNEL RELEASE  11/15/2011   Procedure: CARPAL TUNNEL RELEASE;  Surgeon: Tennis Must, MD;  Location: [facility];  Service: Orthopedics;  Laterality: Left;   CATARACT EXTRACTION Left 2011   COLONOSCOPY  2003,2014   normal,   COLONOSCOPY WITH PROPOFOL N/A 06/24/2017   Procedure: COLONOSCOPY WITH PROPOFOL;  Surgeon: Gatha Mayer, MD;  Location: [facility];  Service: Endoscopy;  Laterality: N/A;   EYE SURGERY     FRACTURE SURGERY  2006   HAND SURGERY  oct 2012 and jan 2013   Dr Dean--[facility]   HOT HEMOSTASIS N/A 06/24/2017   Procedure: HOT HEMOSTASIS (ARGON PLASMA COAGULATION/BICAP);  Surgeon: Gatha Mayer, MD;  Location: [facility];  Service: Endoscopy;  Laterality: N/A;   left wrist  hardware removal 10/12  07/2011   OPEN REDUCTION INTERNAL FIXATION (ORIF) DISTAL RADIAL FRACTURE Left 03/2005   left wrist   PARTIAL PROCTECTOMY BY TEM  06/22/2013   Procedure: PARTIAL PROCTECTOMY BY TEM;  Surgeon: Leighton Ruff, MD;  [facility];  Service: General;;   POLYPECTOMY     RETINAL DETACHMENT SURGERY  2009   left   TRANSANAL EXCISION OF RECTAL MASS N/A 03/18/2022   Procedure: TRANSANAL EXCISION RECTAL POLYP;  Surgeon: Leighton Ruff, MD;  Location: [facility];  Service: General;  Laterality: N/A;   Family History  Problem Relation Age of Onset   Diabetes Mother        1st degree relative   Hypertension Mother    Stroke Mother    Heart disease Father    Cancer Maternal Aunt    Heart disease Maternal Uncle    ALS Paternal Aunt    Diabetes Paternal Uncle  Heart disease Maternal Grandfather    Heart disease Paternal Grandfather    Cancer Cousin    Heart disease Cousin    Alzheimer's disease Cousin    Osteoporosis Other        1st degree relative   Colon cancer Neg Hx    Esophageal cancer Neg Hx    Stomach cancer Neg Hx    Rectal cancer Neg Hx    Colon polyps Neg Hx    Crohn's disease Neg Hx    Social History   Socioeconomic History   Marital status: Married    Spouse name: Not on file   Number of children: 2   Years of education: Not on file   Highest education level: Not on file  Occupational History   Occupation: Retired  Tobacco Use   Smoking status: Never    Passive exposure: Past (years ago 15 years ago when husband smokked)   Smokeless tobacco: Never  Vaping Use   Vaping Use: Never used  Substance and Sexual Activity   Alcohol use: No   Drug use: No   Sexual activity: Not Currently    Birth control/protection: None  Other Topics Concern   Not on file  Social History Narrative   Married one son one daughter and 3 grandchildren    Likes to travel   No alcohol tobacco or drug use reported   Social Determinants of Health    Financial Resource Strain: Low Risk  (11/22/2022)   Overall Financial Resource Strain (CARDIA)    Difficulty of Paying Living Expenses: Not hard at all  Food Insecurity: No Food Insecurity (11/22/2022)   Hunger Vital Sign    Worried About Running Out of Food in the Last Year: Never true    Ran Out of Food in the Last Year: Never true  Transportation Needs: No Transportation Needs (11/22/2022)   PRAPARE - Hydrologist (Medical): No    Lack of Transportation (Non-Medical): No  Physical Activity: Sufficiently Active (11/22/2022)   Exercise Vital Sign    Days of Exercise per Week: 5 days    Minutes of Exercise per Session: 30 min  Stress: No Stress Concern Present (11/22/2022)   [place]    Feeling of Stress : Not at all  Social Connections: [place] (11/18/2021)   Social Connection and Isolation Panel [NHANES]    Frequency of Communication with Friends and Family: More than three times a week    Frequency of Social Gatherings with Friends and Family: More than three times a week    Attends Religious Services: 1 to 4 times per year    Active Member of [employer] or Organizations: Yes    Attends Archivist Meetings: 1 to 4 times per year    Marital Status: Married    Tobacco Counseling Counseling given: Not Answered   Clinical Intake:  Pre-visit preparation completed: Yes  Pain : No/denies pain     Nutritional Status: BMI of 19-24  Normal Nutritional Risks: None Diabetes: No  How often do you need to have someone help you when you read instructions, pamphlets, or other written materials from your doctor or pharmacy?: 1 - Never  Diabetic? no  Interpreter Needed?: No  Information entered by :: NAllen LPN   Activities of Daily Living    11/22/2022    3:57 PM 03/18/2022    8:45 AM  In your present state of health, do you have any  difficulty performing the following  activities:  Hearing? 0 0  Vision? 0 0  Difficulty concentrating or making decisions? 0 0  Walking or climbing stairs? 0 0  Dressing or bathing? 0 0  Doing errands, shopping? 0   Preparing Food and eating ? N   Using the Toilet? N   In the past six months, have you accidently leaked urine? N   Do you have problems with loss of bowel control? N   Managing your Medications? N   Managing your Finances? N   Housekeeping or managing your Housekeeping? N     Patient Care Team: Haydee Salter, MD as PCP - General (Family Medicine) Gatha Mayer, MD as Consulting Physician (Gastroenterology) Marygrace Drought, MD as Consulting Physician (Ophthalmology) Larey Dresser, MD as Consulting Physician (Cardiology) Ladell Pier, DDS as Consulting Physician (Dentistry) Linus Mako, MD as Consulting Physician  Indicate any recent Medical Services you may have received from other than [facility] providers in the past year (date may be approximate).     Assessment:   This is a routine wellness examination for [facility].  Hearing/Vision screen Vision Screening - Comments:: Regular eye exams, Dr.Tanner  Dietary issues and exercise activities discussed: Current Exercise Habits: Home exercise routine, Type of exercise: walking, Time (Minutes): 30, Frequency (Times/Week): 5, Weekly Exercise (Minutes/Week): 150   Goals Addressed             This Visit's Progress    Patient Stated       11/22/2022, wants to do extra exercise three times a week       Depression Screen    11/22/2022    3:56 PM 06/03/2022   11:06 AM 11/18/2021    4:27 PM 10/07/2020   10:39 AM 09/05/2019    9:36 AM 06/05/2018    3:07 PM 03/17/2017   10:04 AM  PHQ 2/9 Scores  PHQ - 2 Score 0 0 0 0 0 0 0    Fall Risk    11/22/2022    3:56 PM 06/03/2022   11:06 AM 11/18/2021    4:29 PM 10/07/2020   10:38 AM 06/05/2018    3:07 PM  [place] in the past year? 0 0 0 0 No  Number falls in past yr: 0 0 0 0   Injury with  Fall? 0 0 0 0   Risk for fall due to : Medication side effect  No Fall Risks    Follow up Falls prevention discussed;Education provided;Falls evaluation completed  Falls prevention discussed Falls prevention discussed     FALL RISK PREVENTION PERTAINING TO THE HOME:  Any stairs in or around the home? Yes  If so, are there any without handrails? No  Home free of loose throw rugs in walkways, pet beds, electrical cords, etc? Yes  Adequate lighting in your home to reduce risk of falls? Yes   ASSISTIVE DEVICES UTILIZED TO PREVENT FALLS:  Life alert? No  Use of a cane, walker or w/c? No  Grab bars in the bathroom? No  Shower chair or bench in shower? Yes  Elevated toilet seat or a handicapped toilet? Yes   TIMED UP AND GO:  Was the test performed? No .      Cognitive Function:    03/17/2017   10:06 AM  MMSE - Mini Mental State Exam  Orientation to time 5  Orientation to Place 5  Registration 3  Attention/ Calculation 5  Recall 3  Language- name 2 objects 2  Language- repeat 1  Language- follow 3 step command 3  Language- read & follow direction 1  Write a sentence 1  Copy design 1  Total score 30        11/22/2022    3:57 PM  6CIT Screen  What Year? 0 points  What month? 0 points  What time? 0 points  Count back from 20 0 points  Months in reverse 0 points  Repeat phrase 0 points  Total Score 0 points    Immunizations Immunization History  Administered Date(s) Administered   Fluad Quad(high Dose 65+) 06/22/2019   Influenza Split 09/17/2011   Influenza Whole 08/30/2007, 07/31/2008, 09/02/2010, 09/01/2012   Influenza, High Dose Seasonal PF 07/11/2013, 07/31/2014, 07/22/2015, 07/19/2016, 07/09/2017   Influenza-Unspecified 06/18/2020, 07/06/2021   PFIZER(Purple Top)SARS-COV-2 Vaccination 11/20/2019, 12/11/2019, 09/01/2020   Pneumococcal Conjugate-13 07/31/2014   Pneumococcal Polysaccharide-23 09/21/2010, 07/19/2016   Td 02/07/2008   Zoster Recombinat  (Shingrix) 08/06/2019, 10/15/2019   Zoster, Live 02/07/2008    TDAP status: Due, Education has been provided regarding the importance of this vaccine. Advised may receive this vaccine at local pharmacy or Health Dept. Aware to provide a copy of the vaccination record if obtained from local pharmacy or Health Dept. Verbalized acceptance and understanding.  Flu Vaccine status: Up to date  Pneumococcal vaccine status: Up to date  Covid-19 vaccine status: Completed vaccines  Qualifies for Shingles Vaccine? Yes   Zostavax completed Yes   Shingrix Completed?: Yes  Screening Tests Health Maintenance  Topic Date Due   DTaP/Tdap/Td (2 - Tdap) 02/06/2018   INFLUENZA VACCINE  05/18/2022   COVID-19 Vaccine (4 - 2023-24 season) 06/18/2022   Medicare Annual Wellness (AWV)  11/18/2022   MAMMOGRAM  12/14/2022   COLONOSCOPY (Pts 45-10yr Insurance coverage will need to be confirmed)  02/19/2023   DEXA SCAN  12/10/2023   Pneumonia Vaccine 77 Years old  Completed   Hepatitis C Screening  Completed   Zoster Vaccines- Shingrix  Completed   HPV VACCINES  Aged Out    Health Maintenance  Health Maintenance Due  Topic Date Due   DTaP/Tdap/Td (2 - Tdap) 02/06/2018   INFLUENZA VACCINE  05/18/2022   COVID-19 Vaccine (4 - 2023-24 season) 06/18/2022   Medicare Annual Wellness (AWV)  11/18/2022    Colorectal cancer screening: Type of screening: Colonoscopy. Completed 02/18/2022. Repeat every 1 years  Mammogram status: Completed 12/14/2021. Repeat every year  Bone Density status: Completed 12/09/2021.   Lung Cancer Screening: (Low Dose CT Chest recommended if Age 79-80years, 30 pack-year currently smoking OR have quit w/in 15years.) does not qualify.   Lung Cancer Screening Referral: no  Additional Screening:  Hepatitis C Screening: does qualify; Completed 10/24/2015  Vision Screening: Recommended annual ophthalmology exams for early detection of glaucoma and other disorders of the eye. Is the  patient up to date with their annual eye exam?  Yes  Who is the provider or what is the name of the office in which the patient attends annual eye exams? Dr. TSatira SarkIf pt is not established with a provider, would they like to be referred to a provider to establish care? No .   Dental Screening: Recommended annual dental exams for proper oral hygiene  Community Resource Referral / Chronic Care Management: CRR required this visit?  No   CCM required this visit?  No      Plan:     I have personally reviewed and noted the following in the patient's chart:   Medical and social  history Use of alcohol, tobacco or illicit drugs  Current medications and supplements including opioid prescriptions. Patient is not currently taking opioid prescriptions. Functional ability and status Nutritional status Physical activity Advanced directives List of other physicians Hospitalizations, surgeries, and ER visits in previous 12 months Vitals Screenings to include cognitive, depression, and falls Referrals and appointments  In addition, I have reviewed and discussed with patient certain preventive protocols, quality metrics, and best practice recommendations. A written personalized care plan for preventive services as well as general preventive health recommendations were provided to patient.     Kellie Simmering, LPN   06/18/9165   Nurse Notes: none  Due to this being a virtual visit, the after visit summary with patients personalized plan was offered to patient via mail or my-chart.  Patient would like to access on my-chart

## 2022-11-25 ENCOUNTER — Other Ambulatory Visit: Payer: Self-pay | Admitting: Family Medicine

## 2022-11-25 DIAGNOSIS — Z1231 Encounter for screening mammogram for malignant neoplasm of breast: Secondary | ICD-10-CM

## 2022-12-14 ENCOUNTER — Encounter: Payer: Self-pay | Admitting: Family Medicine

## 2022-12-14 ENCOUNTER — Ambulatory Visit: Payer: Medicare PPO | Admitting: Family Medicine

## 2022-12-14 VITALS — BP 120/64 | HR 60 | Temp 97.4°F | Ht 63.5 in | Wt 136.8 lb

## 2022-12-14 DIAGNOSIS — R350 Frequency of micturition: Secondary | ICD-10-CM | POA: Diagnosis not present

## 2022-12-14 DIAGNOSIS — N958 Other specified menopausal and perimenopausal disorders: Secondary | ICD-10-CM

## 2022-12-14 DIAGNOSIS — R438 Other disturbances of smell and taste: Secondary | ICD-10-CM | POA: Diagnosis not present

## 2022-12-14 LAB — POCT URINALYSIS DIPSTICK
Bilirubin, UA: NEGATIVE
Glucose, UA: NEGATIVE
Ketones, UA: NEGATIVE
Leukocytes, UA: NEGATIVE
Nitrite, UA: NEGATIVE
Protein, UA: NEGATIVE
Spec Grav, UA: 1.02 (ref 1.010–1.025)
Urobilinogen, UA: 0.2 E.U./dL
pH, UA: 5.5 (ref 5.0–8.0)

## 2022-12-14 NOTE — Progress Notes (Signed)
[facility] [address] [place] [address] Dept: [phone] Dept Fax: [phone]  Office Visit  Subjective:    Patient ID: Bobette Mo, female    DOB: September 26, 1946, 77 y.o..   MRN: [id number]  Chief Complaint  Patient presents with   Urinary Frequency    C/o having frequency w/urination 1 week and metallic taste in mouth off/on x 1 week    History of Present Illness:  Patient is in today for having a 1-week history of increased urinary frequency. She notes it is mildly uncomfortable, but not necessarily burning. She is unsure if she is drinking more than usual.   Ms. Haire also complains of a metallic taste in her mouth. She is not having any dental issues. She does take a daily iron supplement, as she had tried to donate blood and was told her iron levels were low int he past.  Past Medical History: Patient Active Problem List   Diagnosis Date Noted   Recurrent rectal polyp 03/05/2022   Low iron stores 09/29/2021   Allergic rhinitis 09/29/2021   Varicose veins of both lower extremities 08/21/2021   Aortic atherosclerosis ([place]) 08/12/2021   Hiatal hernia 08/12/2021   Hematuria 06/18/2021   Atrial tachycardia 09/04/2019   Stricture and stenosis of esophagus 09/04/2019   Osteopenia 09/04/2019   History of colonic polyps 03/22/2013   Hyperlipidemia 10/27/2010   Female stress incontinence 10/27/2010   Hypothyroidism 07/18/2007   Past Surgical History:  Procedure Laterality Date   ABDOMINAL HYSTERECTOMY  1988   prolapse uterus   BUNIONECTOMY Right 10/2014   CARPAL TUNNEL RELEASE  11/15/2011   Procedure: CARPAL TUNNEL RELEASE;  Surgeon: Tennis Must, MD;  Location: [facility];  Service: Orthopedics;  Laterality: Left;   CATARACT EXTRACTION Left 2011   COLONOSCOPY  2003,2014   normal,   COLONOSCOPY WITH PROPOFOL N/A 06/24/2017   Procedure: COLONOSCOPY WITH PROPOFOL;  Surgeon: Gatha Mayer,  MD;  [facility];  Service: Endoscopy;  Laterality: N/A;   EYE SURGERY     FRACTURE SURGERY  2006   HAND SURGERY  oct 2012 and jan 2013   Dr Dean--[facility]   HOT HEMOSTASIS N/A 06/24/2017   Procedure: HOT HEMOSTASIS (ARGON PLASMA COAGULATION/BICAP);  Surgeon: Gatha Mayer, MD;  Location: [facility];  Service: Endoscopy;  Laterality: N/A;   left wrist hardware removal 10/12  07/2011   OPEN REDUCTION INTERNAL FIXATION (ORIF) DISTAL RADIAL FRACTURE Left 03/2005   left wrist   PARTIAL PROCTECTOMY BY TEM  06/22/2013   Procedure: PARTIAL PROCTECTOMY BY TEM;  Surgeon: Leighton Ruff, MD;  [facility];  Service: General;;   POLYPECTOMY     RETINAL DETACHMENT SURGERY  2009   left   TRANSANAL EXCISION OF RECTAL MASS N/A 03/18/2022   Procedure: TRANSANAL EXCISION RECTAL POLYP;  Surgeon: Leighton Ruff, MD;  Location: [facility];  Service: General;  Laterality: N/A;   Family History  Problem Relation Age of Onset   Diabetes Mother        1st degree relative   Hypertension Mother    Stroke Mother    Heart disease Father    Cancer Maternal Aunt    Heart disease Maternal Uncle    ALS Paternal Aunt    Diabetes Paternal Uncle    Heart disease Maternal Grandfather    Heart disease Paternal Grandfather    Cancer Cousin    Heart disease Cousin  Alzheimer's disease Cousin    Osteoporosis Other        1st degree relative   Colon cancer Neg Hx    Esophageal cancer Neg Hx    Stomach cancer Neg Hx    Rectal cancer Neg Hx    Colon polyps Neg Hx    Crohn's disease Neg Hx    Outpatient Medications Prior to Visit  Medication Sig Dispense Refill   acetaminophen (TYLENOL) 500 MG tablet Take 500 mg by mouth daily as needed for moderate pain or headache.     CALCIUM-MAGNESIUM-VITAMIN D PO Take 1 tablet by mouth daily.     desonide (DESOWEN) 0.05 % cream Apply topically 2 (two) times daily. (Patient taking differently: Apply topically as needed.) 30 g 0    ferrous sulfate 325 (65 FE) MG tablet Take 325 mg by mouth daily.     Flaxseed, Linseed, (FLAX SEED OIL) 1000 MG CAPS Take 1,000 mg by mouth daily. evening     fluticasone (FLONASE) 50 MCG/ACT nasal spray Place 2 sprays into both nostrils daily. (Patient taking differently: Place 2 sprays into both nostrils as needed.) 16 g 1   Hypromellose (ARTIFICIAL TEARS OP) Apply 1 drop to eye daily as needed (dry eyes).     levothyroxine (SYNTHROID) 75 MCG tablet TAKE 1 TABLET(75 MCG) BY MOUTH DAILY 90 tablet 3   Multiple Vitamin (MULTIVITAMIN) tablet Take 1 tablet by mouth daily.     Omega-3 Fatty Acids (FISH OIL) 1000 MG CAPS Take 1 capsule by mouth 2 (two) times daily.      omeprazole (PRILOSEC) 20 MG capsule Take 1 capsule (20 mg total) by mouth daily. 90 capsule 3   pravastatin (PRAVACHOL) 40 MG tablet Take 1 tablet (40 mg total) by mouth daily. 90 tablet 3   traMADol (ULTRAM) 50 MG tablet Take 1 tablet (50 mg total) by mouth every 6 (six) hours as needed. 20 tablet 0   aspirin 81 MG tablet Take 81 mg by mouth every other day. (Patient not taking: Reported on 11/22/2022)     No facility-administered medications prior to visit.   Allergies  Allergen Reactions   Sulfonamide Derivatives Rash     Objective:   Today's Vitals   12/14/22 1118  BP: 120/64  Pulse: 60  Temp: (!) 97.4 F (36.3 C)  TempSrc: Temporal  SpO2: 98%  Weight: 136 lb 12.8 oz (62.1 kg)  Height: 5' 3.5" (1.613 m)   Body mass index is 23.85 kg/m.   General: Well developed, well nourished. No acute distress. Psych: Alert and oriented. Normal mood and affect.  Health Maintenance Due  Topic Date Due   DTaP/Tdap/Td (2 - Tdap) 02/06/2018   MAMMOGRAM  12/14/2022   Lab Results Component Ref Range & Units 11:35 (12/14/22)  Color, UA  yellow  Clarity, UA  clear  Glucose, UA Negative Negative  Bilirubin, UA  neg  Ketones, UA  neg  Spec Grav, UA 1.010 - 1.025 1.020  Blood, UA  1+  pH, UA 5.0 - 8.0 5.5  Protein, UA Negative  Negative  Urobilinogen, UA 0.2 or 1.0 E.U./dL 0.2  Nitrite, UA  neg  Leukocytes, UA Negative Negative   Lab Results  Component Value Date   WBC 4.4 10/16/2021   HGB 12.4 10/16/2021   HCT 36.8 10/16/2021   MCV 94.7 10/16/2021   PLT 269.0 10/16/2021       Assessment & Plan:   Problem List Items Addressed This Visit   None Visit Diagnoses  Frequency of urination    -  Primary   There is a small amount of blood on the dipstick. I will send for a urine culture. If negative, would consider possible GUSM and a trial of vaginal estrogen.   Relevant Orders   POCT Urinalysis Dipstick (Completed)   Urine Culture   Metallic taste       Multiple potential causes. I recommend she hold her iron supplement, as her last CBC showed no anemia.       Return if symptoms worsen or fail to improve.   Haydee Salter, MD

## 2022-12-15 LAB — URINE CULTURE
MICRO NUMBER:: 14620542
Result:: NO GROWTH
SPECIMEN QUALITY:: ADEQUATE

## 2022-12-16 DIAGNOSIS — N958 Other specified menopausal and perimenopausal disorders: Secondary | ICD-10-CM | POA: Insufficient documentation

## 2022-12-16 MED ORDER — ESTRADIOL 0.1 MG/GM VA CREA: TOPICAL_CREAM | VAGINAL | 12 refills | Status: DC

## 2022-12-16 NOTE — Addendum Note (Signed)
Addended by: Haydee Salter on: 12/16/2022 08:06 AM   Modules accepted: Orders

## 2023-01-13 ENCOUNTER — Ambulatory Visit
Admission: RE | Admit: 2023-01-13 | Discharge: 2023-01-13 | Disposition: A | Discharge status: Home / Self Care | Payer: Medicare PPO | Source: Ambulatory Visit | Attending: Family Medicine | Admitting: Family Medicine

## 2023-01-13 DIAGNOSIS — Z1231 Encounter for screening mammogram for malignant neoplasm of breast: Secondary | ICD-10-CM

## 2023-03-23 DIAGNOSIS — H52203 Unspecified astigmatism, bilateral: Secondary | ICD-10-CM | POA: Diagnosis not present

## 2023-03-23 DIAGNOSIS — H25811 Combined forms of age-related cataract, right eye: Secondary | ICD-10-CM | POA: Diagnosis not present

## 2023-03-23 DIAGNOSIS — H35373 Puckering of macula, bilateral: Secondary | ICD-10-CM | POA: Diagnosis not present

## 2023-04-15 ENCOUNTER — Telehealth: Payer: Self-pay | Admitting: Internal Medicine

## 2023-04-15 NOTE — Telephone Encounter (Signed)
Patient is due for colonoscopy due to history of polyps.  Please contact her and arrange a direct colonoscopy and let me know if she has any questions.

## 2023-04-15 NOTE — Telephone Encounter (Signed)
Patient scheduled for pre-visit on 05/03/23 and colonoscopy on 06/09/23. Patient understood and agreed.

## 2023-05-03 ENCOUNTER — Ambulatory Visit (AMBULATORY_SURGERY_CENTER): Payer: Medicare PPO

## 2023-05-03 VITALS — Ht 63.5 in | Wt 132.0 lb

## 2023-05-03 DIAGNOSIS — Z86010 Personal history of colonic polyps: Secondary | ICD-10-CM

## 2023-05-03 NOTE — Progress Notes (Addendum)
No egg or soy allergy known to patient  No issues known to pt with past sedation with any surgeries or procedures Patient denies ever being told they had issues or difficulty with intubation  No FH of Malignant Hyperthermia Pt is not on diet pills Pt is not on  home 02  Pt is not on blood thinners  Pt denies issues with constipation  No A fib or A flutter - pt reports hx of atrial tachycardia 08/2019 Have any cardiac testing pending--no LOA: independent  Prep: Miralax   PV competed with patient. Prep instructions sent via mychart and home address.

## 2023-05-04 DIAGNOSIS — H18593 Other hereditary corneal dystrophies, bilateral: Secondary | ICD-10-CM | POA: Diagnosis not present

## 2023-05-04 DIAGNOSIS — L821 Other seborrheic keratosis: Secondary | ICD-10-CM | POA: Diagnosis not present

## 2023-05-04 DIAGNOSIS — H25011 Cortical age-related cataract, right eye: Secondary | ICD-10-CM | POA: Diagnosis not present

## 2023-05-04 DIAGNOSIS — H04123 Dry eye syndrome of bilateral lacrimal glands: Secondary | ICD-10-CM | POA: Diagnosis not present

## 2023-05-04 DIAGNOSIS — H2511 Age-related nuclear cataract, right eye: Secondary | ICD-10-CM | POA: Diagnosis not present

## 2023-05-04 DIAGNOSIS — L720 Epidermal cyst: Secondary | ICD-10-CM | POA: Diagnosis not present

## 2023-05-04 DIAGNOSIS — L738 Other specified follicular disorders: Secondary | ICD-10-CM | POA: Diagnosis not present

## 2023-05-25 ENCOUNTER — Encounter: Payer: Self-pay | Admitting: Internal Medicine

## 2023-06-09 ENCOUNTER — Encounter: Payer: Self-pay | Admitting: Internal Medicine

## 2023-06-09 ENCOUNTER — Ambulatory Visit (AMBULATORY_SURGERY_CENTER): Payer: Medicare PPO | Admitting: Internal Medicine

## 2023-06-09 VITALS — BP 149/57 | HR 61 | Temp 97.8°F | Resp 11 | Ht 63.5 in | Wt 132.0 lb

## 2023-06-09 DIAGNOSIS — K5730 Diverticulosis of large intestine without perforation or abscess without bleeding: Secondary | ICD-10-CM

## 2023-06-09 DIAGNOSIS — Z86010 Personal history of colonic polyps: Secondary | ICD-10-CM

## 2023-06-09 DIAGNOSIS — K6389 Other specified diseases of intestine: Secondary | ICD-10-CM | POA: Diagnosis not present

## 2023-06-09 DIAGNOSIS — Z09 Encounter for follow-up examination after completed treatment for conditions other than malignant neoplasm: Secondary | ICD-10-CM | POA: Diagnosis not present

## 2023-06-09 DIAGNOSIS — E785 Hyperlipidemia, unspecified: Secondary | ICD-10-CM | POA: Diagnosis not present

## 2023-06-09 DIAGNOSIS — E039 Hypothyroidism, unspecified: Secondary | ICD-10-CM | POA: Diagnosis not present

## 2023-06-09 DIAGNOSIS — K6289 Other specified diseases of anus and rectum: Secondary | ICD-10-CM | POA: Diagnosis not present

## 2023-06-09 MED ORDER — SODIUM CHLORIDE 0.9 % IV SOLN: 500.0000 mL | Freq: Once | INTRAVENOUS | Status: DC

## 2023-06-09 NOTE — Progress Notes (Signed)
Pt's states no medical or surgical changes since previsit or office visit. VS assessed by C.W 

## 2023-06-09 NOTE — Patient Instructions (Addendum)
I saw a scar but no polyp remaining - biopsied to check. Will let you know.  You still have diverticulosis - thickened muscle rings and pouches in the colon wall. Please read the handout about this condition.  I appreciate the opportunity to care for you. Iva Boop, MD, [facility]  Handout provided on diverticulosis.  Resume previous diet.  Continue present medications.  Await pathology results.  No recommendation at this time regarding repeat colonoscopy.   YOU HAD AN ENDOSCOPIC PROCEDURE TODAY AT THE [facility]:   Refer to the procedure report that was given to you for any specific questions about what was found during the examination.  If the procedure report does not answer your questions, please call your gastroenterologist to clarify.  If you requested that your care partner not be given the details of your procedure findings, then the procedure report has been included in a sealed envelope for you to review at your convenience later.  YOU SHOULD EXPECT: Some feelings of bloating in the abdomen. Passage of more gas than usual.  Walking can help get rid of the air that was put into your GI tract during the procedure and reduce the bloating. If you had a lower endoscopy (such as a colonoscopy or flexible sigmoidoscopy) you may notice spotting of blood in your stool or on the toilet paper. If you underwent a bowel prep for your procedure, you may not have a normal bowel movement for a few days.  Please Note:  You might notice some irritation and congestion in your nose or some drainage.  This is from the oxygen used during your procedure.  There is no need for concern and it should clear up in a day or so.  SYMPTOMS TO REPORT IMMEDIATELY:  Following lower endoscopy (colonoscopy or flexible sigmoidoscopy):  Excessive amounts of blood in the stool  Significant tenderness or worsening of abdominal pains  Swelling of the abdomen that is new, acute  Fever of 100F or  higher  For urgent or emergent issues, a gastroenterologist can be reached at any hour by calling [phone]. Do not use MyChart messaging for urgent concerns.    DIET:  We do recommend a small meal at first, but then you may proceed to your regular diet.  Drink plenty of fluids but you should avoid alcoholic beverages for 24 hours.  ACTIVITY:  You should plan to take it easy for the rest of today and you should NOT DRIVE or use heavy machinery until tomorrow (because of the sedation medicines used during the test).    FOLLOW UP: Our staff will call the number listed on your records the next business day following your procedure.  We will call around 7:15- 8:00 am to check on you and address any questions or concerns that you may have regarding the information given to you following your procedure. If we do not reach you, we will leave a message.     If any biopsies were taken you will be contacted by phone or by letter within the next 1-3 weeks.  Please call us at [phone] if you have not heard about the biopsies in 3 weeks.    SIGNATURES/CONFIDENTIALITY: You and/or your care partner have signed paperwork which will be entered into your electronic medical record.  These signatures attest to the fact that that the information above on your After Visit Summary has been reviewed and is understood.  Full responsibility of the confidentiality of this discharge information lies  with you and/or your care-partner.

## 2023-06-09 NOTE — Op Note (Signed)
[facility] Patient Name: Nancy Cuevas Procedure Date: 06/09/2023 8:18 AM MRN: [id number] Endoscopist: Iva Boop , MD, [id number] Age: 77 Referring MD:  Date of Birth: 11-Jul-1946 Gender: Female Account #: [id number] Procedure:                Colonoscopy Indications:              High risk colon cancer surveillance: Personal                            history of colonic polyps, Last colonoscopy: 2023                            w/ recurrent rectal adenoma that was resected again                            in 2023                           - Personal history of colonic polyps. 02/2013 -                           diminutive transverse removed, large flat rectal                           bxed - referred to surgery                           11/2016 - 15 mm rectal adenoma - ? Recurrent - and a                           25 mm ascending ssp + cytologic dysplasia -                           06/2017 - no polyps seen Medicines:                Monitored Anesthesia Care Procedure:                Pre-Anesthesia Assessment:                           - Prior to the procedure, a History and Physical                            was performed, and patient medications and                            allergies were reviewed. The patient's tolerance of                            previous anesthesia was also reviewed. The risks                            and benefits of the procedure and the sedation  options and risks were discussed with the patient.                            All questions were answered, and informed consent                            was obtained. Prior Anticoagulants: The patient has                            taken no anticoagulant or antiplatelet agents. ASA                            Grade Assessment: II - A patient with mild systemic                            disease. After reviewing the risks and benefits,                            the patient  was deemed in satisfactory condition to                            undergo the procedure.                           After obtaining informed consent, the colonoscope                            was passed under direct vision. Throughout the                            procedure, the patient's blood pressure, pulse, and                            oxygen saturations were monitored continuously. The                            CF [id number] #[id number] was introduced through the anus                            and advanced to the the cecum, identified by                            appendiceal orifice and ileocecal valve. The                            colonoscopy was somewhat difficult due to                            significant looping. The patient tolerated the                            procedure well. The quality of the bowel  preparation was excellent. The ileocecal valve,                            appendiceal orifice, and rectum were photographed. Scope In: 8:43:51 AM Scope Out: 9:04:33 AM Scope Withdrawal Time: 0 hours 12 minutes 7 seconds  Total Procedure Duration: 0 hours 20 minutes 42 seconds  Findings:                 The perianal and digital rectal examinations were                            normal.                           Multiple diverticula were found in the right colon.                           A post polypectomy scar was found in the distal                            rectum. There was no evidence of the previous                            polyp. Biopsies were taken with a cold forceps for                            histology. Verification of patient identification                            for the specimen was done. Estimated blood loss was                            minimal.                           The exam was otherwise without abnormality. Complications:            No immediate complications. Estimated Blood Loss:     Estimated blood loss: none.  Estimated blood loss                            was minimal. Impression:               - Diverticulosis in the right colon.                           - Post-polypectomy scar in the distal rectum.                            Biopsied. No sign of polyp.                           - The examination was otherwise normal. rectal                            retroflexion not possible due to deformed rectal  vault after surgery Recommendation:           - Patient has a contact number available for                            emergencies. The signs and symptoms of potential                            delayed complications were discussed with the                            patient. Return to normal activities tomorrow.                            Written discharge instructions were provided to the                            patient.                           - Resume previous diet.                           - Continue present medications.                           - Await pathology results.                           - No recommendation at this time regarding repeat                            colonoscopy. Iva Boop, MD 06/09/2023 9:13:19 AM This report has been signed electronically.

## 2023-06-09 NOTE — Progress Notes (Signed)
[facility] History and Physical   Primary Care Physician:  Loyola Mast, MD   Reason for Procedure:   hx colon polyps  Plan:    colonoscopy     HPI: Nancy Cuevas is a 77 y.o. 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   Bunion    Cataract    removed from left eye   Contact dermatitis and other eczema, due to unspecified cause    Contact dermatitis and other eczema, due to unspecified cause    COVID    Oct 2022 coughing tired cold s/s   Disorder of bone and cartilage, unspecified    Female stress incontinence    GERD (gastroesophageal reflux disease)    HYPERLIPIDEMIA 10/27/2010   Hyperpotassemia    HYPOTHYROIDISM 07/18/2007   Nonspecific (abnormal) findings on radiological and other examination of other intrathoracic organs    OSTEOPENIA 03/24/2008   Osteopenia    Osteoporosis    Palpitations    Echo (1/12) EF 55-60%, mild diastolic dysfunction, atrial septal aneurysm. Holter (1/12): short runs of atrial tachycardia versus MAT. ;  Echo (14) EF 55-60%, normal wall motion, mild MR, mild LAE, atrial septal aneurysm   Personal history of colonic and rectal adenomas 03/22/2013   POSTMENOPAUSAL STATUS 02/07/2008   Symptomatic menopausal or female climacteric states     Past Surgical History:  Procedure Laterality Date   ABDOMINAL HYSTERECTOMY  1988   prolapse uterus   BUNIONECTOMY Right 10/2014   CARPAL TUNNEL RELEASE  11/15/2011   Procedure: CARPAL TUNNEL RELEASE;  Surgeon: Tami Ribas, MD;  Location: [facility];  Service: Orthopedics;  Laterality: Left;   CATARACT EXTRACTION Left 2011   COLONOSCOPY  2003,2014   normal,   COLONOSCOPY WITH PROPOFOL N/A 06/24/2017   Procedure: COLONOSCOPY WITH PROPOFOL;  Surgeon: Iva Boop, MD;  Location: [facility];  Service: Endoscopy;  Laterality: N/A;   EYE  SURGERY     FRACTURE SURGERY  2006   HAND SURGERY  oct 2012 and jan 2013   Dr Dean--[facility]   HOT HEMOSTASIS N/A 06/24/2017   Procedure: HOT HEMOSTASIS (ARGON PLASMA COAGULATION/BICAP);  Surgeon: Iva Boop, MD;  Location: [facility];  Service: Endoscopy;  Laterality: N/A;   left wrist hardware removal 10/12  07/2011   OPEN REDUCTION INTERNAL FIXATION (ORIF) DISTAL RADIAL FRACTURE Left 03/2005   left wrist   PARTIAL PROCTECTOMY BY TEM  06/22/2013   Procedure: PARTIAL PROCTECTOMY BY TEM;  Surgeon: Romie Levee, MD;  Location: [facility];  Service: General;;   POLYPECTOMY     RETINAL DETACHMENT SURGERY  2009   left   TRANSANAL EXCISION OF RECTAL MASS N/A 03/18/2022   Procedure: TRANSANAL EXCISION RECTAL POLYP;  Surgeon: Romie Levee, MD;  Location: [facility];  Service: General;  Laterality: N/A;    Prior to Admission medications   Medication Sig Start Date End Date Taking? Authorizing Provider  CALCIUM-MAGNESIUM-VITAMIN D PO Take 1 tablet by mouth daily.   Yes [provider]  ferrous sulfate 325 (65 FE) MG tablet Take 325 mg by mouth daily.   Yes [provider]  Flaxseed, Linseed, (FLAX SEED OIL) 1000 MG CAPS Take 1,000 mg by mouth daily. evening   Yes [provider]  Hypromellose (ARTIFICIAL TEARS OP) Apply 1 drop to eye  daily as needed (dry eyes).   Yes [provider]  levothyroxine (SYNTHROID) 75 MCG tablet TAKE 1 TABLET(75 MCG) BY MOUTH DAILY 11/12/22  Yes Loyola Mast, MD  Multiple Vitamin (MULTIVITAMIN) tablet Take 1 tablet by mouth daily.   Yes [provider]  Omega-3 Fatty Acids (FISH OIL) 1000 MG CAPS Take 1 capsule by mouth daily.   Yes [provider]  omeprazole (PRILOSEC) 20 MG capsule Take 1 capsule (20 mg total) by mouth daily. 11/12/22  Yes Loyola Mast, MD  pravastatin (PRAVACHOL) 40 MG tablet Take 1 tablet (40 mg total) by mouth daily. 11/12/22  Yes Loyola Mast, MD  acetaminophen  (TYLENOL) 500 MG tablet Take 500 mg by mouth daily as needed for moderate pain or headache.    [provider]  desonide (DESOWEN) 0.05 % cream Apply topically 2 (two) times daily. Patient taking differently: Apply topically as needed. 09/05/19   Dianne Dun, MD  fluticasone (FLONASE) 50 MCG/ACT nasal spray Place 2 sprays into both nostrils daily. Patient taking differently: Place 2 sprays into both nostrils as needed. 01/23/16   Saguier, Ramon Dredge, PA-C  traMADol (ULTRAM) 50 MG tablet Take 1 tablet (50 mg total) by mouth every 6 (six) hours as needed. 03/18/22   Romie Levee, MD    Current Outpatient Medications  Medication Sig Dispense Refill   CALCIUM-MAGNESIUM-VITAMIN D PO Take 1 tablet by mouth daily.     ferrous sulfate 325 (65 FE) MG tablet Take 325 mg by mouth daily.     Flaxseed, Linseed, (FLAX SEED OIL) 1000 MG CAPS Take 1,000 mg by mouth daily. evening     Hypromellose (ARTIFICIAL TEARS OP) Apply 1 drop to eye daily as needed (dry eyes).     levothyroxine (SYNTHROID) 75 MCG tablet TAKE 1 TABLET(75 MCG) BY MOUTH DAILY 90 tablet 3   Multiple Vitamin (MULTIVITAMIN) tablet Take 1 tablet by mouth daily.     Omega-3 Fatty Acids (FISH OIL) 1000 MG CAPS Take 1 capsule by mouth daily.     omeprazole (PRILOSEC) 20 MG capsule Take 1 capsule (20 mg total) by mouth daily. 90 capsule 3   pravastatin (PRAVACHOL) 40 MG tablet Take 1 tablet (40 mg total) by mouth daily. 90 tablet 3   acetaminophen (TYLENOL) 500 MG tablet Take 500 mg by mouth daily as needed for moderate pain or headache.     desonide (DESOWEN) 0.05 % cream Apply topically 2 (two) times daily. (Patient taking differently: Apply topically as needed.) 30 g 0   fluticasone (FLONASE) 50 MCG/ACT nasal spray Place 2 sprays into both nostrils daily. (Patient taking differently: Place 2 sprays into both nostrils as needed.) 16 g 1   traMADol (ULTRAM) 50 MG tablet Take 1 tablet (50 mg total) by mouth every 6 (six) hours as needed. 20  tablet 0   Current Facility-Administered Medications  Medication Dose Route Frequency Provider Last Rate Last Admin   0.9 %  sodium chloride infusion  500 mL Intravenous Once Iva Boop, MD        Allergies as of 06/09/2023 - Review Complete 06/09/2023  Allergen Reaction Noted   Sulfonamide derivatives Rash     Family History  Problem Relation Age of Onset   Diabetes Mother        1st degree relative   Hypertension Mother    Stroke Mother    Heart disease Father    Cancer Maternal Aunt    Heart disease Maternal Uncle    ALS  Paternal Aunt    Diabetes Paternal Uncle    Heart disease Maternal Grandfather    Heart disease Paternal Grandfather    Cancer Cousin    Heart disease Cousin    Alzheimer's disease Cousin    Osteoporosis Other        1st degree relative   Colon cancer Neg Hx    Esophageal cancer Neg Hx    Stomach cancer Neg Hx    Rectal cancer Neg Hx    Colon polyps Neg Hx    Crohn's disease Neg Hx     Social History   Socioeconomic History   Marital status: Married    Spouse name: Not on file   Number of children: 2   Years of education: Not on file   Highest education level: Not on file  Occupational History   Occupation: Retired  Tobacco Use   Smoking status: Never    Passive exposure: Past (years ago 15 years ago when husband smokked)   Smokeless tobacco: Never  Vaping Use   Vaping status: Never Used  Substance and Sexual Activity   Alcohol use: No   Drug use: No   Sexual activity: Not Currently    Birth control/protection: None  Other Topics Concern   Not on file  Social History Narrative   Married one son one daughter and 3 grandchildren    Likes to travel   No alcohol tobacco or drug use reported   Social Determinants of Health   Financial Resource Strain: Low Risk  (11/22/2022)   Overall Financial Resource Strain (CARDIA)    Difficulty of Paying Living Expenses: Not hard at all  Food Insecurity: No Food Insecurity (11/22/2022)    Hunger Vital Sign    Worried About Running Out of Food in the Last Year: Never true    Ran Out of Food in the Last Year: Never true  Transportation Needs: No Transportation Needs (11/22/2022)   PRAPARE - Administrator, Civil Service (Medical): No    Lack of Transportation (Non-Medical): No  Physical Activity: Sufficiently Active (11/22/2022)   Exercise Vital Sign    Days of Exercise per Week: 5 days    Minutes of Exercise per Session: 30 min  Stress: No Stress Concern Present (11/22/2022)   [employer] of Occupational Health - Occupational Stress Questionnaire    Feeling of Stress : Not at all  Social Connections: Socially Integrated (11/18/2021)   Social Connection and Isolation Panel [NHANES]    Frequency of Communication with Friends and Family: More than three times a week    Frequency of Social Gatherings with Friends and Family: More than three times a week    Attends Religious Services: 1 to 4 times per year    Active Member of [employer] or Organizations: Yes    Attends Banker Meetings: 1 to 4 times per year    Marital Status: Married  Catering manager Violence: Not At Risk (11/18/2021)   Humiliation, Afraid, Rape, and Kick questionnaire    Fear of Current or Ex-Partner: No    Emotionally Abused: No    Physically Abused: No    Sexually Abused: No    Review of Systems:  All other review of systems negative except as mentioned in the HPI.  Physical Exam: Vital signs BP (!) 165/74   Pulse (!) 59   Temp 97.8 F (36.6 C) (Skin)   Resp 15   Ht 5' 3.5" (1.613 m)   Wt 132 lb (59.9 kg)  SpO2 100%   BMI 23.02 kg/m   General:   Alert,  Well-developed, well-nourished, pleasant and cooperative in NAD Lungs:  Clear throughout to auscultation.   Heart:  Regular rate and rhythm; no murmurs, clicks, rubs,  or gallops. Abdomen:  Soft, nontender and nondistended. Normal bowel sounds.   Neuro/Psych:  Alert and cooperative. Normal mood and affect. A and O x  3   @Goran Olden  Sena Slate, MD, [facility] [phone] (pager) 06/09/2023 8:31 AM@

## 2023-06-09 NOTE — Progress Notes (Signed)
Vss nad trans to pacu 

## 2023-06-09 NOTE — Progress Notes (Signed)
Pt passing gas in recovery. While getting pt up to dress pt c/o abdominal cramping. Pt ambulated to bathroom and back but did not pass any more air. Pt laid back down in the bed on right side and given Levsin 0.25mg  SL. Pt laid there for about 10 more minutes and then felt a little less cramping and expressed she was ready to get dressed. Reviewed techniques to try at home to help with gas discomfort. Pt verbalized understanding.

## 2023-06-09 NOTE — Progress Notes (Signed)
Called to room to assist during endoscopic procedure.  Patient ID and intended procedure confirmed with present staff. Received instructions for my participation in the procedure from the performing physician.  

## 2023-06-10 ENCOUNTER — Telehealth: Payer: Self-pay

## 2023-06-10 NOTE — Telephone Encounter (Signed)
  Follow up Call-     06/09/2023    7:31 AM 02/18/2022    8:52 AM  Call back number  Post procedure Call Back phone  # [phone]  Permission to leave phone message Yes Yes     Patient questions:  Do you have a fever, pain , or abdominal swelling? No. Pain Score  0 *  Have you tolerated food without any problems? Yes.    Have you been able to return to your normal activities? Yes.    Do you have any questions about your discharge instructions: Diet   No. Medications  No. Follow up visit  No.  Do you have questions or concerns about your Care? No.  Actions: * If pain score is 4 or above: No action needed, pain <4.

## 2023-06-14 ENCOUNTER — Encounter: Payer: Self-pay | Admitting: Internal Medicine

## 2023-06-14 NOTE — Progress Notes (Signed)
Biopsies w/o signs of residual polyp Letter to patient - no recall

## 2023-06-23 DIAGNOSIS — H25011 Cortical age-related cataract, right eye: Secondary | ICD-10-CM | POA: Diagnosis not present

## 2023-06-23 DIAGNOSIS — H2511 Age-related nuclear cataract, right eye: Secondary | ICD-10-CM | POA: Diagnosis not present

## 2023-06-23 DIAGNOSIS — H25811 Combined forms of age-related cataract, right eye: Secondary | ICD-10-CM | POA: Diagnosis not present

## 2023-06-24 HISTORY — PX: CATARACT EXTRACTION: SUR2

## 2023-07-06 ENCOUNTER — Encounter: Payer: Self-pay | Admitting: Family Medicine

## 2023-07-06 ENCOUNTER — Ambulatory Visit (INDEPENDENT_AMBULATORY_CARE_PROVIDER_SITE_OTHER): Payer: Medicare PPO | Admitting: Family Medicine

## 2023-07-06 VITALS — BP 120/66 | HR 68 | Temp 98.3°F | Ht 63.5 in | Wt 131.4 lb

## 2023-07-06 DIAGNOSIS — R790 Abnormal level of blood mineral: Secondary | ICD-10-CM | POA: Diagnosis not present

## 2023-07-06 DIAGNOSIS — E785 Hyperlipidemia, unspecified: Secondary | ICD-10-CM | POA: Diagnosis not present

## 2023-07-06 DIAGNOSIS — I4719 Other supraventricular tachycardia: Secondary | ICD-10-CM

## 2023-07-06 DIAGNOSIS — Z23 Encounter for immunization: Secondary | ICD-10-CM

## 2023-07-06 DIAGNOSIS — R002 Palpitations: Secondary | ICD-10-CM | POA: Diagnosis not present

## 2023-07-06 DIAGNOSIS — Z0000 Encounter for general adult medical examination without abnormal findings: Secondary | ICD-10-CM | POA: Diagnosis not present

## 2023-07-06 DIAGNOSIS — E039 Hypothyroidism, unspecified: Secondary | ICD-10-CM

## 2023-07-06 LAB — LIPID PANEL
Cholesterol: 194 mg/dL (ref 0–200)
HDL: 85 mg/dL (ref >39.00)
LDL Cholesterol: 92 mg/dL (ref 0–99)
NonHDL: 109.07
Total CHOL/HDL Ratio: 2
Triglycerides: 87 mg/dL (ref 0.0–149.0)
VLDL: 17.4 mg/dL (ref 0.0–40.0)

## 2023-07-06 LAB — CBC
HCT: 37.4 % (ref 36.0–46.0)
Hemoglobin: 12.2 g/dL (ref 12.0–15.0)
MCHC: 32.7 g/dL (ref 30.0–36.0)
MCV: 94.3 fl (ref 78.0–100.0)
Platelets: 273 10*3/uL (ref 150.0–400.0)
RBC: 3.97 Mil/uL (ref 3.87–5.11)
RDW: 12.8 % (ref 11.5–15.5)
WBC: 5.5 10*3/uL (ref 4.0–10.5)

## 2023-07-06 LAB — TSH: TSH: 0.38 u[IU]/mL (ref 0.35–5.50)

## 2023-07-06 NOTE — Assessment & Plan Note (Signed)
I will check lipids today. Continue pravastatin 40 mg daily.

## 2023-07-06 NOTE — Assessment & Plan Note (Signed)
I will check her TSH today. Continue levothyroxine 75 mcg daily.

## 2023-07-06 NOTE — Assessment & Plan Note (Signed)
I will reassess her CBC and iron stores.

## 2023-07-06 NOTE — Progress Notes (Signed)
[facility] PRIMARY CARE [facility] Trecia Rogers [facility] [address] [place] [address] Dept: [phone] Dept Fax: [phone]  Annual Physical Visit  Subjective:    Patient ID: Nancy Cuevas, female    DOB: 07-Sep-1946, 77 y.o..   MRN: [id number]  Chief Complaint  Patient presents with   Annual Exam    CPE/labs.  Fasting today. Wants to discuss ? Bone medication.    Wants flu shot.    History of Present Illness:  Patient is in today for an annual physical/preventative visit.  Review of Systems  Constitutional:  Negative for chills, diaphoresis, fever, malaise/fatigue and weight loss.  HENT:  Negative for congestion, ear pain, hearing loss, sinus pain, sore throat and tinnitus.   Eyes:  Negative for blurred vision, pain, discharge and redness.  Respiratory:  Negative for cough, shortness of breath and wheezing.   Cardiovascular:  Positive for palpitations. Negative for chest pain.       Has a past history of atria tachycardia. Previously prescribed metoprolol, but no longer taking. She has episodic issues with palpitations. These last foer several seconds and she can go weeks in between episodes.  Gastrointestinal:  Positive for heartburn. Negative for abdominal pain, constipation, diarrhea, nausea and vomiting.       History of GERD. Well managed on omeprazole.  Musculoskeletal:  Positive for back pain. Negative for joint pain and myalgias.       Low back pain with stiffness after sitting. This loosens up after she gets up and around more.  Skin:  Negative for itching and rash.  Psychiatric/Behavioral:  Negative for depression. The patient is not nervous/anxious.    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   Recurrent rectal polyp 03/05/2022   Low iron stores 09/29/2021   Allergic rhinitis 09/29/2021   Varicose veins of both lower extremities 08/21/2021   Aortic  atherosclerosis (HCC) 08/12/2021   Hiatal hernia 08/12/2021   Hematuria 06/18/2021   Atrial tachycardia 09/04/2019   Stricture and stenosis of esophagus 09/04/2019   Osteopenia 09/04/2019   History of colonic polyps 03/22/2013   Hyperlipidemia 10/27/2010   Female stress incontinence 10/27/2010   Hypothyroidism 07/18/2007   Past Surgical History:  Procedure Laterality Date   ABDOMINAL HYSTERECTOMY  1988   prolapse uterus   BUNIONECTOMY Right 10/2014   CARPAL TUNNEL RELEASE  11/15/2011   Procedure: CARPAL TUNNEL RELEASE;  Surgeon: Tami Ribas, MD;  Location: [facility];  Service: Orthopedics;  Laterality: Left;   CATARACT EXTRACTION Left 2011   CATARACT EXTRACTION Right 06/24/2023   COLONOSCOPY  [id number]   normal,   COLONOSCOPY WITH PROPOFOL N/A 06/24/2017   Procedure: COLONOSCOPY WITH PROPOFOL;  Surgeon: Iva Boop, MD;  Location: [facility];  Service: Endoscopy;  Laterality: N/A;   EYE SURGERY     FRACTURE SURGERY  2006   HAND SURGERY  oct 2012 and jan 2013   Dr Dean--[facility]   HOT HEMOSTASIS N/A 06/24/2017   Procedure: HOT HEMOSTASIS (ARGON PLASMA COAGULATION/BICAP);  Surgeon: Iva Boop, MD;  Location: [facility];  Service: Endoscopy;  Laterality: N/A;   left wrist hardware removal 10/12  07/2011   OPEN REDUCTION INTERNAL FIXATION (ORIF) DISTAL RADIAL FRACTURE Left 03/2005   left wrist   PARTIAL PROCTECTOMY BY TEM  06/22/2013   Procedure: PARTIAL PROCTECTOMY BY TEM;  Surgeon: Romie Levee, MD;  Location: [facility];  Service: General;;   POLYPECTOMY  RETINAL DETACHMENT SURGERY  2009   left   TRANSANAL EXCISION OF RECTAL MASS N/A 03/18/2022   Procedure: TRANSANAL EXCISION RECTAL POLYP;  Surgeon: Romie Levee, MD;  Location: [facility] [place];  Service: General;  Laterality: N/A;   Family History  Problem Relation Age of Onset   Diabetes Mother        1st degree relative   Hypertension Mother    Stroke Mother    Heart  disease Father    Cancer Maternal Aunt    Heart disease Maternal Uncle    ALS Paternal Aunt    Diabetes Paternal Uncle    Heart disease Maternal Grandfather    Heart disease Paternal Grandfather    Cancer Cousin    Heart disease Cousin    Alzheimer's disease Cousin    Osteoporosis Other        1st degree relative   Colon cancer Neg Hx    Esophageal cancer Neg Hx    Stomach cancer Neg Hx    Rectal cancer Neg Hx    Colon polyps Neg Hx    Crohn's disease Neg Hx    Outpatient Medications Prior to Visit  Medication Sig Dispense Refill   acetaminophen (TYLENOL) 500 MG tablet Take 500 mg by mouth daily as needed for moderate pain or headache.     Calcium Carb-Cholecalciferol (CALCIUM + VITAMIN D3 PO) Take by mouth.     desonide (DESOWEN) 0.05 % cream Apply topically 2 (two) times daily. (Patient taking differently: Apply topically as needed.) 30 g 0   ferrous sulfate 325 (65 FE) MG tablet Take 325 mg by mouth daily.     Flaxseed, Linseed, (FLAX SEED OIL) 1000 MG CAPS Take 1,000 mg by mouth daily. evening     fluticasone (FLONASE) 50 MCG/ACT nasal spray Place 2 sprays into both nostrils daily. (Patient taking differently: Place 2 sprays into both nostrils as needed.) 16 g 1   Hypromellose (ARTIFICIAL TEARS OP) Apply 1 drop to eye daily as needed (dry eyes).     levothyroxine (SYNTHROID) 75 MCG tablet TAKE 1 TABLET(75 MCG) BY MOUTH DAILY 90 tablet 3   Multiple Vitamin (MULTIVITAMIN) tablet Take 1 tablet by mouth daily.     Omega-3 Fatty Acids (FISH OIL) 1000 MG CAPS Take 1 capsule by mouth daily.     omeprazole (PRILOSEC) 20 MG capsule Take 1 capsule (20 mg total) by mouth daily. 90 capsule 3   pravastatin (PRAVACHOL) 40 MG tablet Take 1 tablet (40 mg total) by mouth daily. 90 tablet 3   CALCIUM-MAGNESIUM-VITAMIN D PO Take 1 tablet by mouth daily.     traMADol (ULTRAM) 50 MG tablet Take 1 tablet (50 mg total) by mouth every 6 (six) hours as needed. 20 tablet 0   No facility-administered  medications prior to visit.   Allergies  Allergen Reactions   Sulfonamide Derivatives Rash   Objective:   Today's Vitals   07/06/23 0913  BP: 120/66  Pulse: 68  Temp: 98.3 F (36.8 C)  TempSrc: Temporal  SpO2: 98%  Weight: 131 lb 6.4 oz (59.6 kg)  Height: 5' 3.5" (1.613 m)   Body mass index is 22.91 kg/m.   General: Well developed, well nourished. No acute distress. HEENT: Normocephalic, non-traumatic. PERRL, EOMI. Conjunctiva clear. External ears normal.   EAC and TMs normal bilaterally. Nose clear without congestion or rhinorrhea. Mucous membranes   moist. Oropharynx clear. Good dentition. Neck: Supple. No lymphadenopathy. No thyromegaly. Lungs: Clear to auscultation bilaterally. No wheezing, rales or  rhonchi. CV: RRR without murmurs or rubs. Pulses 2+ bilaterally. Abdomen: Soft, non-tender. Bowel sounds positive, normal pitch and frequency. No   hepatosplenomegaly. No rebound or guarding. Back: Straight. No CVA tenderness bilaterally. Extremities: Full ROM. No joint swelling or tenderness. No edema noted. Skin: Warm and dry. No rashes. Psych: Alert and oriented. Normal mood and affect.  Health Maintenance Due  Topic Date Due   DTaP/Tdap/Td (2 - Tdap) 02/06/2018   INFLUENZA VACCINE  05/19/2023     Assessment & Plan:   Problem List Items Addressed This Visit       Cardiovascular and Mediastinum   Atrial tachycardia    It would be reasonable for her to have a follow-up with her cardiologist concerning her palpitations. She may need a Zio patch to reassess.      Relevant Orders   Ambulatory referral to Cardiology     Endocrine   Hypothyroidism    I will check her TSH today. Continue levothyroxine 75 mcg daily.      Relevant Orders   TSH     Other   Hyperlipidemia    I will check lipids today. Continue pravastatin 40 mg daily.      Relevant Orders   Lipid panel   Low iron stores    I will reassess her CBC and iron stores.      Relevant Orders    CBC   Iron, TIBC and Ferritin Panel   Other Visit Diagnoses     Annual physical exam    -  Primary   Overall health is good. Discussed recommend screenings and immunizations.   Palpitations       Relevant Orders   Ambulatory referral to Cardiology   Need for immunization against influenza       Relevant Orders   Flu Vaccine Trivalent High Dose (Fluad) (Completed)       Return in about 1 year (around 07/05/2024) for Reassessment.   Loyola Mast, MD

## 2023-07-06 NOTE — Assessment & Plan Note (Signed)
It would be reasonable for her to have a follow-up with her cardiologist concerning her palpitations. She may need a Zio patch to reassess.

## 2023-07-07 LAB — IRON,TIBC AND FERRITIN PANEL
%SAT: 39 % (calc) (ref 16–45)
Ferritin: 529 ng/mL — ABNORMAL HIGH (ref 16–288)
Iron: 109 ug/dL (ref 45–160)
TIBC: 276 mcg/dL (calc) (ref 250–450)

## 2023-07-28 IMAGING — CT CT RENAL STONE PROTOCOL
1 of 2 series · 14 of 32 positions shown, 19 images · non-contrast
Comparison: None.

CLINICAL DATA: Flank pain, kidney stone suspected. Hematuria,
unknown cause. Right lower quadrant abdominal pain.

EXAM:
CT ABDOMEN AND PELVIS WITHOUT CONTRAST
TECHNIQUE: Multidetector CT imaging of the abdomen and pelvis was performed
following the standard protocol without IV contrast.

[Series 2: abd/pelvis w/(date) · axial · 0.79mm/px · z∈[-470,-120]mm · 14 of 78 slices shown, 19 images]
[im 4/78  soft-tissue]
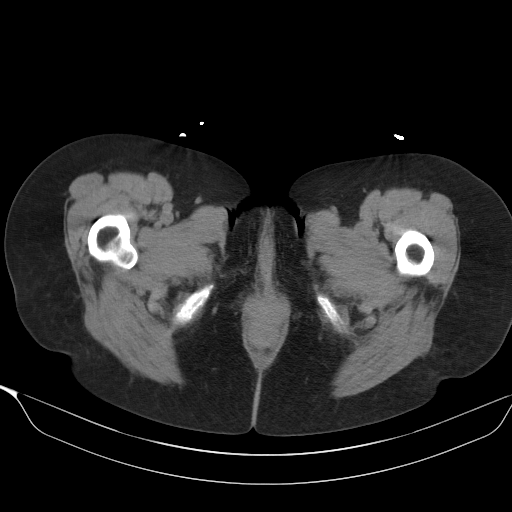
[im 4/78  bone]
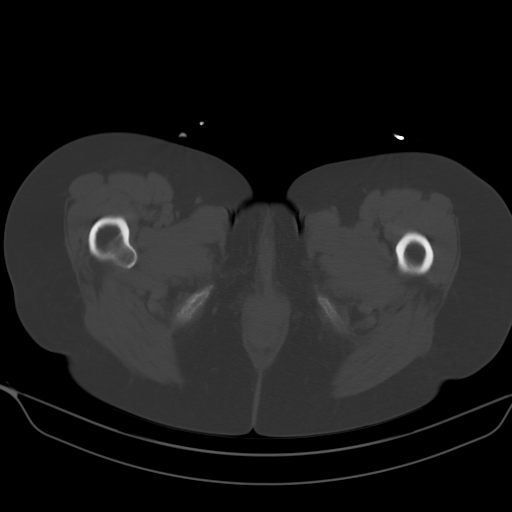
[im 12/78  soft-tissue]
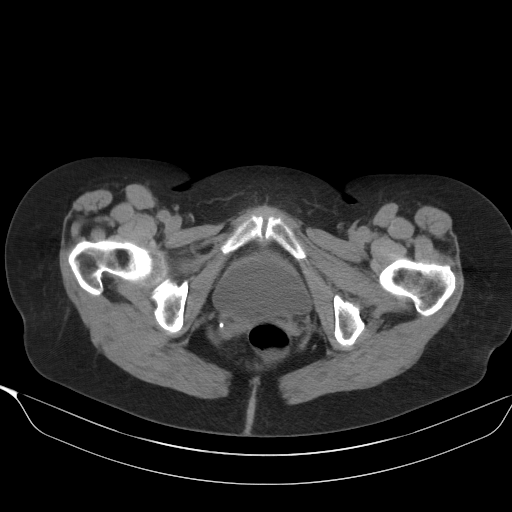
[im 16/78  soft-tissue]
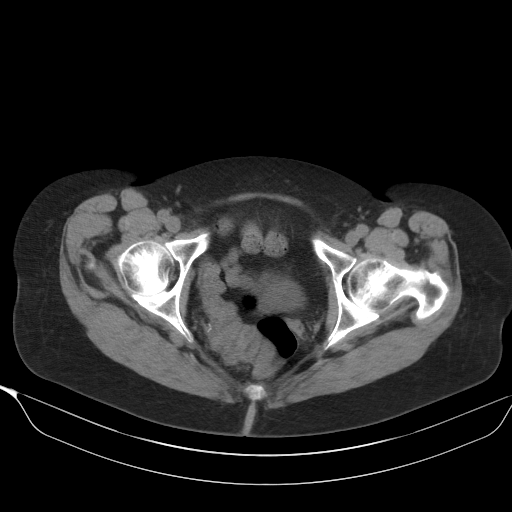
[im 24/78  soft-tissue]
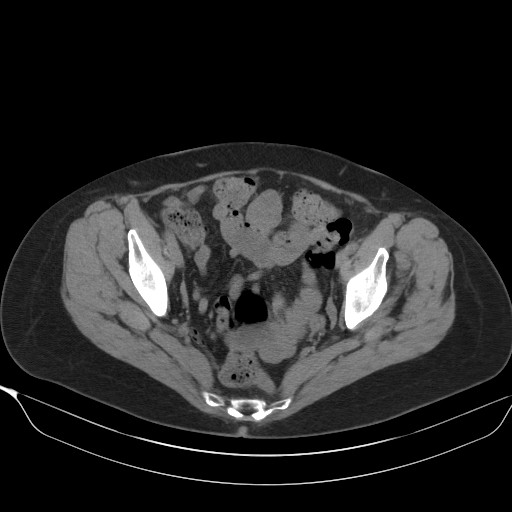
[im 27/78  soft-tissue]
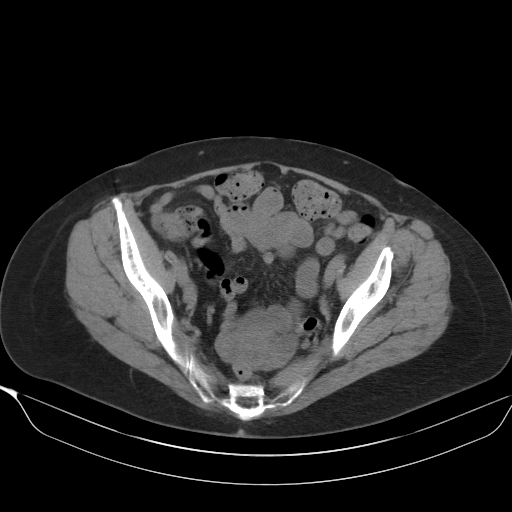
[im 35/78  soft-tissue]
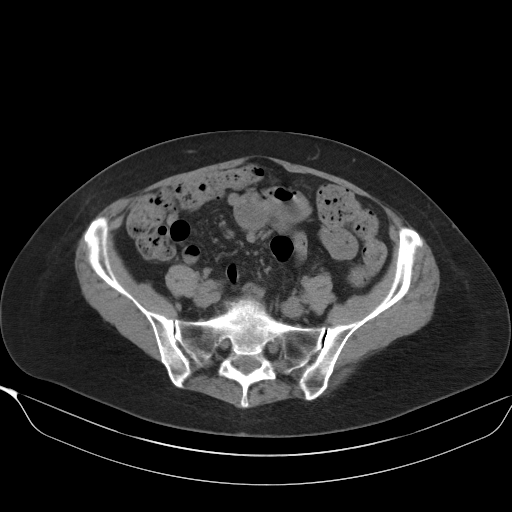
[im 39/78  soft-tissue]
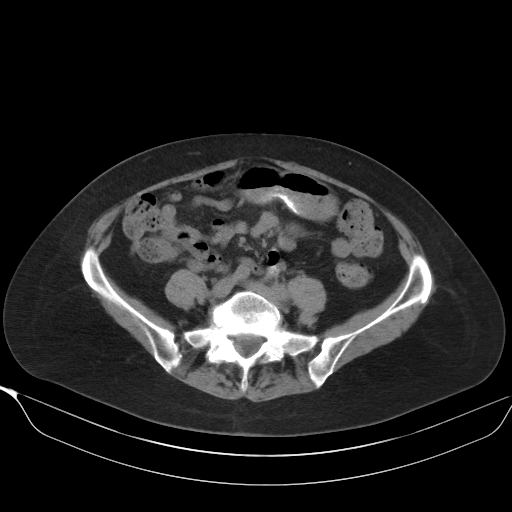
[im 43/78  soft-tissue]
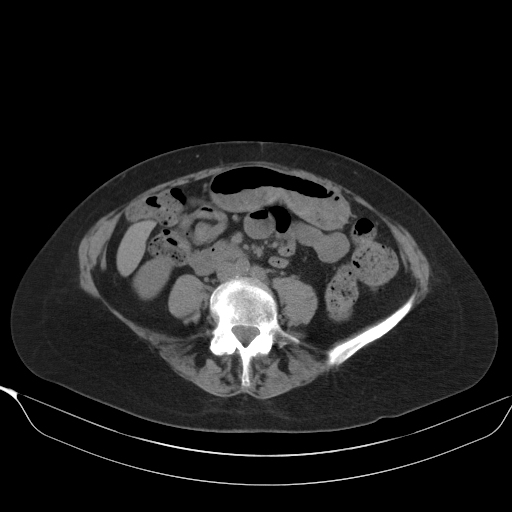
[im 51/78  soft-tissue]
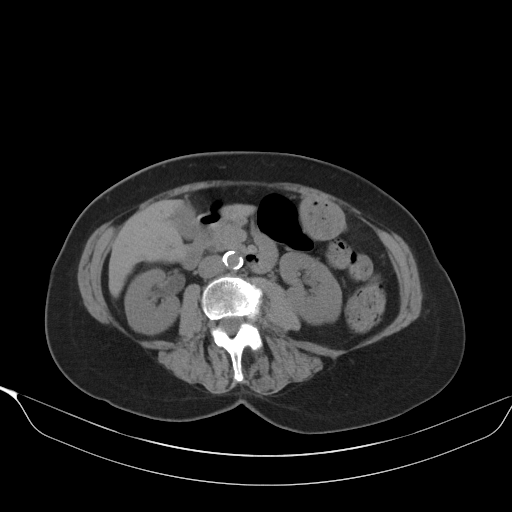
[im 51/78  bone]
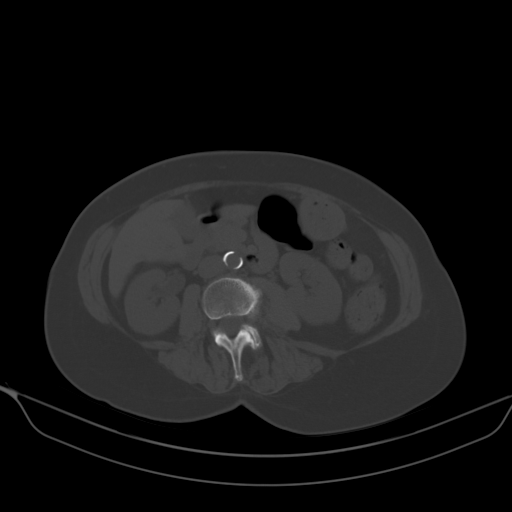
[im 54/78  soft-tissue]
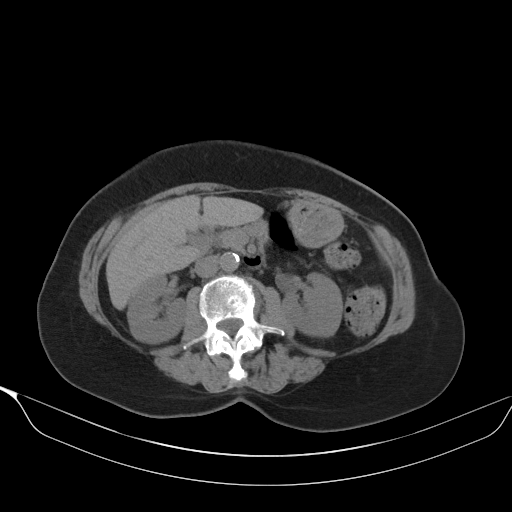
[im 62/78  soft-tissue]
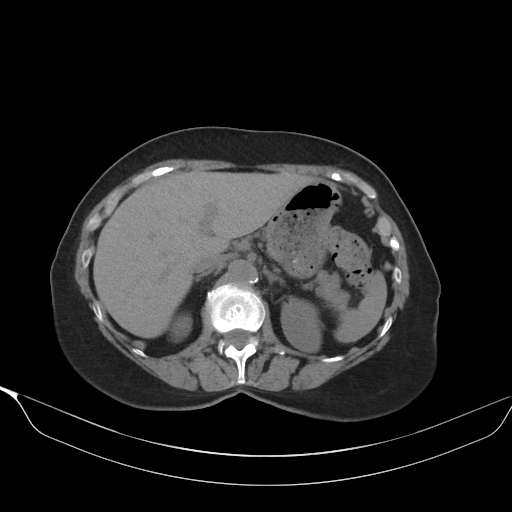
[im 62/78  lung]
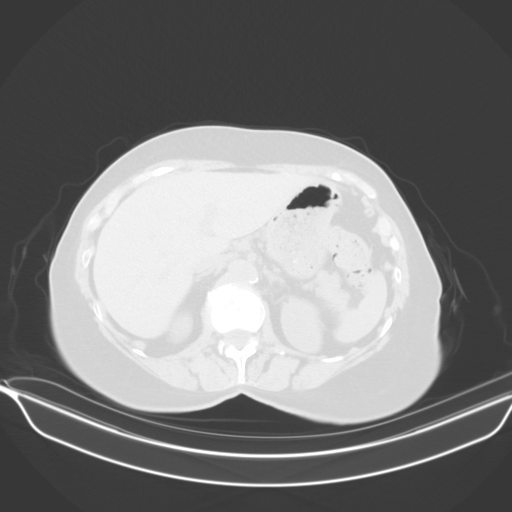
[im 66/78  soft-tissue]
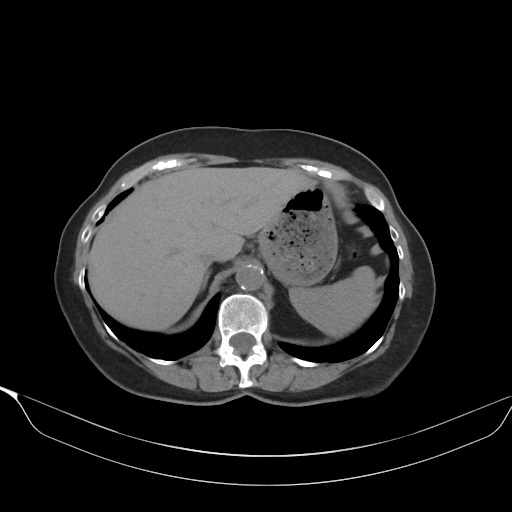
[im 66/78  lung]
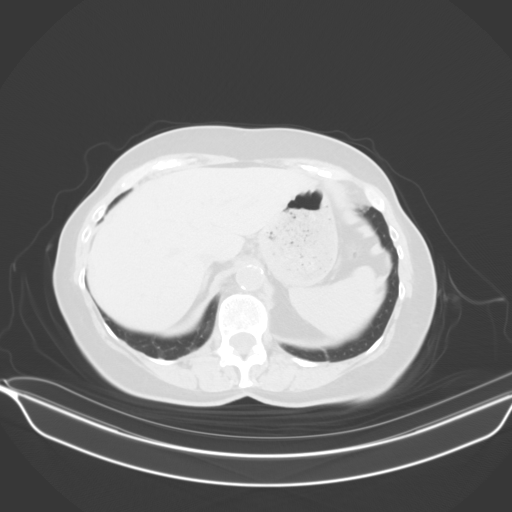
[im 70/78  lung]
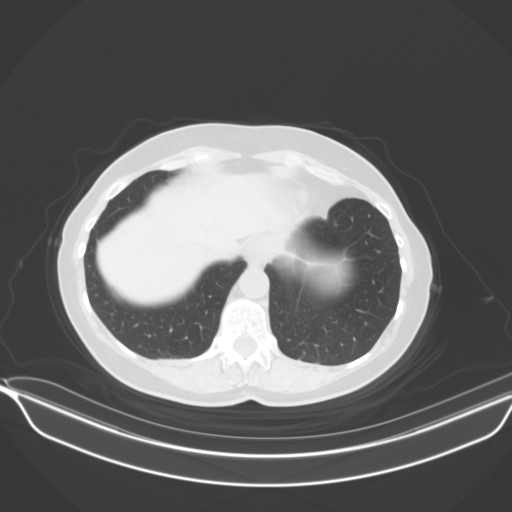
[im 74/78  soft-tissue]
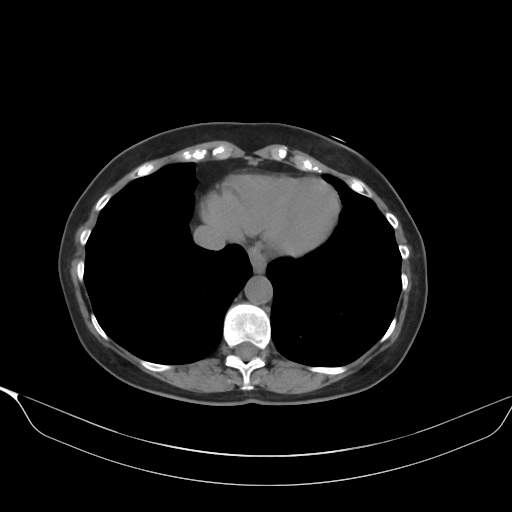
[im 74/78  lung]
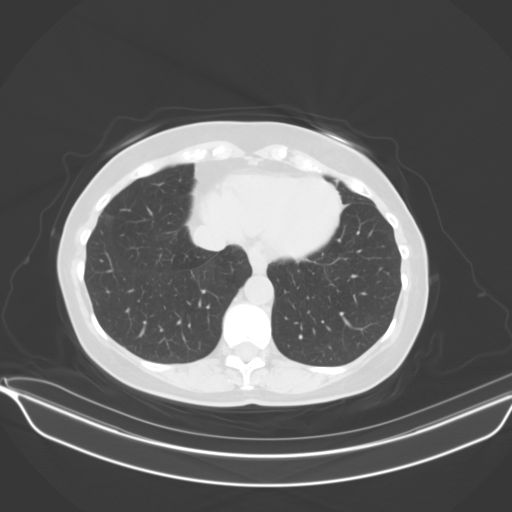

[14 of 32 positions shown; findings below may reference images not displayed]

FINDINGS: Lower chest: No acute abnormality.  Small hiatal hernia.

Hepatobiliary: 6 mm hypodensity noted within the inferior right
hepatic lobe, axial image # [DATE], is too small to accurately
characterize but may represent a tiny hepatic cyst or hemangioma.
The liver is otherwise unremarkable. No intra or extrahepatic
biliary ductal dilation. Gallbladder unremarkable.

Pancreas: Unremarkable

Spleen: Unremarkable

Adrenals/Urinary Tract: Adrenal glands are unremarkable. Kidneys are
normal, without renal calculi, focal lesion, or hydronephrosis.
Bladder is unremarkable.

Stomach/Bowel: Stomach is within normal limits. Appendix appears
normal. No evidence of bowel wall thickening, distention, or
inflammatory changes. No free intraperitoneal gas.

Vascular/Lymphatic: Aortic atherosclerosis. No enlarged abdominal or
pelvic lymph nodes.

Reproductive: Status post hysterectomy. No adnexal masses.

Other: No abdominal wall hernia or abnormality. No abdominopelvic
ascites.

Musculoskeletal: Degenerative changes are seen within the lumbar
spine. No lytic or blastic bone lesions are identified. No acute
bone abnormality.
IMPRESSION: No acute intra-abdominal pathology identified. No definite
radiographic explanation of the patient's reported symptoms.

Normal noncontrast examination of the kidneys and renal collecting
system.

Normal appendix.

Small hiatal hernia.

Aortic Atherosclerosis ([id number]).

## 2023-08-15 DIAGNOSIS — M545 Low back pain, unspecified: Secondary | ICD-10-CM | POA: Diagnosis not present

## 2023-08-15 DIAGNOSIS — M7632 Iliotibial band syndrome, left leg: Secondary | ICD-10-CM | POA: Diagnosis not present

## 2023-08-15 DIAGNOSIS — M7062 Trochanteric bursitis, left hip: Secondary | ICD-10-CM | POA: Diagnosis not present

## 2023-08-25 DIAGNOSIS — S335XXD Sprain of ligaments of lumbar spine, subsequent encounter: Secondary | ICD-10-CM | POA: Diagnosis not present

## 2023-08-25 DIAGNOSIS — M419 Scoliosis, unspecified: Secondary | ICD-10-CM | POA: Diagnosis not present

## 2023-09-01 DIAGNOSIS — M419 Scoliosis, unspecified: Secondary | ICD-10-CM | POA: Diagnosis not present

## 2023-09-01 DIAGNOSIS — S335XXD Sprain of ligaments of lumbar spine, subsequent encounter: Secondary | ICD-10-CM | POA: Diagnosis not present

## 2023-09-08 DIAGNOSIS — M419 Scoliosis, unspecified: Secondary | ICD-10-CM | POA: Diagnosis not present

## 2023-09-08 DIAGNOSIS — S335XXD Sprain of ligaments of lumbar spine, subsequent encounter: Secondary | ICD-10-CM | POA: Diagnosis not present

## 2023-09-13 ENCOUNTER — Other Ambulatory Visit: Payer: Self-pay | Admitting: Cardiology

## 2023-09-13 ENCOUNTER — Ambulatory Visit: Payer: Medicare PPO | Attending: Cardiology | Admitting: Cardiology

## 2023-09-13 ENCOUNTER — Encounter: Payer: Self-pay | Admitting: Cardiology

## 2023-09-13 ENCOUNTER — Ambulatory Visit (INDEPENDENT_AMBULATORY_CARE_PROVIDER_SITE_OTHER): Payer: Medicare PPO

## 2023-09-13 VITALS — BP 160/76 | HR 67 | Resp 16 | Ht 63.0 in | Wt 133.0 lb

## 2023-09-13 DIAGNOSIS — E782 Mixed hyperlipidemia: Secondary | ICD-10-CM | POA: Diagnosis not present

## 2023-09-13 DIAGNOSIS — R030 Elevated blood-pressure reading, without diagnosis of hypertension: Secondary | ICD-10-CM | POA: Diagnosis not present

## 2023-09-13 DIAGNOSIS — E039 Hypothyroidism, unspecified: Secondary | ICD-10-CM

## 2023-09-13 DIAGNOSIS — I4719 Other supraventricular tachycardia: Secondary | ICD-10-CM

## 2023-09-13 DIAGNOSIS — R002 Palpitations: Secondary | ICD-10-CM | POA: Diagnosis not present

## 2023-09-13 MED ORDER — METOPROLOL TARTRATE 25 MG PO TABS
12.5000 mg | ORAL_TABLET | Freq: Two times a day (BID) | ORAL | 3 refills | Status: DC
Start: 2023-09-13 — End: 2023-10-13

## 2023-09-13 NOTE — Progress Notes (Unsigned)
ZIO serial # [id number] from office inventory applied to patient.

## 2023-09-13 NOTE — Progress Notes (Signed)
Cardiology Office Note:    Date:  09/13/2023  NAME:  Nancy Cuevas    MRN: [id number] DOB:  [birth year]   PCP:  Loyola Mast, MD  Former Cardiology Providers: Dr. Shirlee Latch Primary Cardiologist:  Tessa Lerner, DO, [facility] (established care 09/13/2023) Electrophysiologist:  None   Referring MD: Loyola Mast, MD  Reason of Consult: Palpitations and Atrial tachycardia   Chief Complaint  Patient presents with   Atrial Tachycardia    Palpitations   New Patient (Initial Visit)    History of Present Illness:    Nancy Cuevas is a 77 y.o. Caucasian female whose past medical history and cardiovascular risk factors includes: Hyperlipidemia, GERD, hypothyroidism. She is being seen today for the evaluation of Palpitations and Atrial tachycardia  at the request of Loyola Mast, MD.  Patient presents to the office to reestablish care due to concerns for palpitations and atrial tachycardia.  She was seen by Tereso Newcomer and Dr. Marca Ancona back in 2017 for palpitations and had undergone a cardiac monitor which noted PACs and atrial tachycardia.  She was placed on a low-dose metoprolol 12.5 mg p.o. twice daily and her symptoms were very well-controlled.  She continued to be on metoprolol until 2022 and decided to stop the medication as her symptoms were either were very well-controlled and felt she may not need medications.   However over the last several months she has noted reoccurrence of palpitations. They occur at least 3 times a week. Short-lived, seconds. Better with resting and relaxing and taking a deep breath. No precipitating factors. No near-syncope or syncopal events. Overall intensity frequency and duration has not changed.  Incidental finding of an elevated blood pressure at today's office visit.  She does not carry a history of hypertension and currently not on pharmacological therapy.  She is not in the routine of checking her blood pressures at home.  Walks 15-30  minutes every day.   Current Medications: Current Meds  Medication Sig   acetaminophen (TYLENOL) 500 MG tablet Take 500 mg by mouth daily as needed for moderate pain or headache.   Calcium Carb-Cholecalciferol (CALCIUM + VITAMIN D3 PO) Take by mouth.   desonide (DESOWEN) 0.05 % cream Apply topically 2 (two) times daily. (Patient taking differently: Apply topically as needed.)   ferrous sulfate 325 (65 FE) MG tablet Take 325 mg by mouth daily.   Flaxseed, Linseed, (FLAX SEED OIL) 1000 MG CAPS Take 1,000 mg by mouth daily. evening   fluticasone (FLONASE) 50 MCG/ACT nasal spray Place 2 sprays into both nostrils daily. (Patient taking differently: Place 2 sprays into both nostrils as needed.)   Hypromellose (ARTIFICIAL TEARS OP) Apply 1 drop to eye daily as needed (dry eyes).   levothyroxine (SYNTHROID) 75 MCG tablet TAKE 1 TABLET(75 MCG) BY MOUTH DAILY   metoprolol tartrate (LOPRESSOR) 25 MG tablet Take 0.5 tablets (12.5 mg total) by mouth 2 (two) times daily.   Multiple Vitamin (MULTIVITAMIN) tablet Take 1 tablet by mouth daily.   Omega-3 Fatty Acids (FISH OIL) 1000 MG CAPS Take 1 capsule by mouth daily.   omeprazole (PRILOSEC) 20 MG capsule Take 1 capsule (20 mg total) by mouth daily.   pravastatin (PRAVACHOL) 40 MG tablet Take 1 tablet (40 mg total) by mouth daily.     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   Bunion    Cataract    removed from left eye   Contact dermatitis and other eczema, due to unspecified cause    Contact dermatitis and other eczema, due to unspecified cause    COVID    Oct 2022 coughing tired cold s/s   Disorder of bone and cartilage, unspecified    Female stress incontinence    GERD (gastroesophageal reflux disease)    HYPERLIPIDEMIA 10/27/2010   Hyperpotassemia    HYPOTHYROIDISM 07/18/2007    Nonspecific (abnormal) findings on radiological and other examination of other intrathoracic organs    OSTEOPENIA 03/24/2008   Osteopenia    Osteoporosis    Palpitations    Echo (1/12) EF 55-60%, mild diastolic dysfunction, atrial septal aneurysm. Holter (1/12): short runs of atrial tachycardia versus MAT. ;  Echo (14) EF 55-60%, normal wall motion, mild MR, mild LAE, atrial septal aneurysm   Personal history of colonic and rectal adenomas 03/22/2013   POSTMENOPAUSAL STATUS 02/07/2008   Symptomatic menopausal or female climacteric states     Past Surgical History: Past Surgical History:  Procedure Laterality Date   ABDOMINAL HYSTERECTOMY  1988   prolapse uterus   BUNIONECTOMY Right 10/2014   CARPAL TUNNEL RELEASE  11/15/2011   Procedure: CARPAL TUNNEL RELEASE;  Surgeon: Tami Ribas, MD;  Location: [facility];  Service: Orthopedics;  Laterality: Left;   CATARACT EXTRACTION Left 2011   CATARACT EXTRACTION Right 06/24/2023   COLONOSCOPY  [id number]   normal,   COLONOSCOPY WITH PROPOFOL N/A 06/24/2017   Procedure: COLONOSCOPY WITH PROPOFOL;  Surgeon: Iva Boop, MD;  Location: [facility];  Service: Endoscopy;  Laterality: N/A;   EYE SURGERY     FRACTURE SURGERY  2006   HAND SURGERY  oct 2012 and jan 2013   Dr Dean--[facility]   HOT HEMOSTASIS N/A 06/24/2017   Procedure: HOT HEMOSTASIS (ARGON PLASMA COAGULATION/BICAP);  Surgeon: Iva Boop, MD;  Location: [facility];  Service: Endoscopy;  Laterality: N/A;   left wrist hardware removal 10/12  07/2011   OPEN REDUCTION INTERNAL FIXATION (ORIF) DISTAL RADIAL FRACTURE Left 03/2005   left wrist   PARTIAL PROCTECTOMY BY TEM  06/22/2013   Procedure: PARTIAL PROCTECTOMY BY TEM;  Surgeon: Romie Levee, MD;  Location: [facility];  Service: General;;   POLYPECTOMY     RETINAL DETACHMENT SURGERY  2009   left   TRANSANAL EXCISION OF RECTAL MASS N/A 03/18/2022   Procedure: TRANSANAL EXCISION RECTAL POLYP;  Surgeon:  Romie Levee, MD;  Location: [facility];  Service: General;  Laterality: N/A;    Social History: Social History   Tobacco Use   Smoking status: Never    Passive exposure: Past (years ago 15 years ago when husband smokked)   Smokeless tobacco: Never  Vaping Use   Vaping status: Never Used  Substance Use Topics   Alcohol use: No   Drug use: No    Family History: Family History  Problem Relation Age of Onset   Diabetes Mother        1st degree relative   Hypertension Mother    Stroke Mother    Heart disease Father    Cancer Maternal Aunt    Heart disease Maternal Uncle    ALS Paternal Aunt    Diabetes Paternal Uncle    Heart disease Maternal Grandfather    Heart disease Paternal Grandfather    Cancer Cousin    Heart disease  Cousin    Alzheimer's disease Cousin    Osteoporosis Other        1st degree relative   Colon cancer Neg Hx    Esophageal cancer Neg Hx    Stomach cancer Neg Hx    Rectal cancer Neg Hx    Colon polyps Neg Hx    Crohn's disease Neg Hx     ROS:   Review of Systems  Cardiovascular:  Positive for palpitations. Negative for chest pain, claudication, irregular heartbeat, leg swelling, near-syncope, orthopnea, paroxysmal nocturnal dyspnea and syncope.  Respiratory:  Negative for shortness of breath.   Hematologic/Lymphatic: Negative for bleeding problem.  Musculoskeletal:        Left leg pain    EKGs/Labs/Other Studies Reviewed:   EKG: EKG Interpretation Date/Time:  Tuesday September 13 2023 11:06:07 EST Ventricular Rate:  66 PR Interval:  154 QRS Duration:  92 QT Interval:  426 QTC Calculation: 446 R Axis:   57  Text Interpretation: Normal sinus rhythm Normal ECG When compared with ECG of 25-Mar-2005 13:51, No significant change was found Confirmed by Tessa Lerner [phone]) on 09/13/2023 11:18:45 AM  Echocardiogram: September 2014: LVEF 55-60%, mild MR, mildly dilated left atrium, see report for additional  details  Labs:    Latest Ref Rng & Units 07/06/2023   10:34 AM 10/16/2021    9:02 AM 09/05/2019   10:07 AM  CBC  WBC 4.0 - 10.5 K/uL 5.5  4.4  5.3   Hemoglobin 12.0 - 15.0 g/dL 60.4  54.0  98.1   Hematocrit 36.0 - 46.0 % 37.4  36.8  36.6   Platelets 150.0 - 400.0 K/uL 273.0  269.0  260.0        Latest Ref Rng & Units 10/16/2021    9:02 AM 02/26/2021    2:19 PM 05/01/2020    2:59 PM  BMP  Glucose 70 - 99 mg/dL 76  89  90   BUN 6 - 23 mg/dL 13  18  16    Creatinine 0.40 - 1.20 mg/dL 1.91  4.78  2.95   Sodium 135 - 145 mEq/L 139  142  141   Potassium 3.5 - 5.1 mEq/L 4.2  3.8  4.1   Chloride 96 - 112 mEq/L 101  104  103   CO2 19 - 32 mEq/L 32  27  30   Calcium 8.4 - 10.5 mg/dL 9.6  9.6  9.9       Latest Ref Rng & Units 10/16/2021    9:02 AM 02/26/2021    2:19 PM 05/01/2020    2:59 PM  CMP  Glucose 70 - 99 mg/dL 76  89  90   BUN 6 - 23 mg/dL 13  18  16    Creatinine 0.40 - 1.20 mg/dL 6.21  3.08  6.57   Sodium 135 - 145 mEq/L 139  142  141   Potassium 3.5 - 5.1 mEq/L 4.2  3.8  4.1   Chloride 96 - 112 mEq/L 101  104  103   CO2 19 - 32 mEq/L 32  27  30   Calcium 8.4 - 10.5 mg/dL 9.6  9.6  9.9   AST 0 - 37 U/L  20  24   ALT 0 - 35 U/L  13  18     Lab Results  Component Value Date   CHOL 194 07/06/2023   HDL 85.00 07/06/2023   LDLCALC 92 07/06/2023   LDLDIRECT 97.0 02/26/2021   TRIG 87.0 07/06/2023   CHOLHDL  2 07/06/2023   No results for input(s): "LIPOA" in the last 8760 hours. No components found for: "NTPROBNP" No results for input(s): "PROBNP" in the last 8760 hours. Recent Labs    07/06/23 1034  TSH 0.38    Physical Exam:    Today's Vitals   09/13/23 1107  BP: (!) 160/76  Pulse: 67  Resp: 16  SpO2: 97%  Weight: 133 lb (60.3 kg)  Height: 5\' 3"  (1.6 m)   Body mass index is 23.56 kg/m. Wt Readings from Last 3 Encounters:  09/13/23 133 lb (60.3 kg)  07/06/23 131 lb 6.4 oz (59.6 kg)  06/09/23 132 lb (59.9 kg)    Physical Exam  Constitutional: No  distress.  hemodynamically stable  Neck: No JVD present.  Cardiovascular: Normal rate, regular rhythm, S1 normal and S2 normal. Exam reveals no gallop, no S3 and no S4.  No murmur heard. Pulmonary/Chest: Effort normal and breath sounds normal. No stridor. She has no wheezes. She has no rales.  Abdominal: Soft. Bowel sounds are normal. She exhibits no distension. There is no abdominal tenderness.  Musculoskeletal:        General: No edema.     Cervical back: Neck supple.  Neurological: She is alert and oriented to person, place, and time. She has intact cranial nerves (2-12).  Skin: Skin is warm.     Impression & Recommendation(s):  Impression:   ICD-10-CM   1. Atrial tachycardia (HCC)  I47.19 EKG 12-Lead    ECHOCARDIOGRAM COMPLETE    LONG TERM MONITOR (3-14 DAYS)    metoprolol tartrate (LOPRESSOR) 25 MG tablet    2. Palpitations  R00.2 LONG TERM MONITOR (3-14 DAYS)    3. Mixed hyperlipidemia  E78.2     4. Acquired hypothyroidism  E03.9     5. Blood pressure elevated without history of HTN  R03.0        Recommendation(s):  Atrial tachycardia (HCC) Palpitations Prior workup in 2017 noted episodes of PACs/atrial tachycardia.  At that time was started on Lopressor 12.5 mg p.o. twice daily which managed her symptoms very well.  However since she was asymptomatic she decided to discontinue Lopressor to see if the symptoms would resurface.  She did well until recently her palpitations have resurfaced. Will proceed forward with a 2-week monitor to reevaluate her underlying dysrhythmias.  Given her history of PACs and atrial tachycardia she is predisposed to developing atrial fibrillation. Start Lopressor 12.5 mg p.o. twice daily after the Zio patch is complete.  Mixed hyperlipidemia Currently on Pravachol 40 mg p.o. daily. She denies myalgia or other side effects. Most recent lipids dated August 2024, independently reviewed as noted above.  LDL 97 mg/dL.  Acquired  hypothyroidism Currently on Synthroid. TSH as of September 2024 was 0.38 -within normal limits. However she is at risk of iatrogenic hyperthyroidism.  I have asked her to discuss Synthroid dose with PCP for further guidance given her symptoms.  Of note, patient states that her Synthroid dose was just recently increased as well.  Blood pressure elevated without history of HTN Patient does not carry a history of hypertension. She does not check her blood pressures at home. Patient's blood pressures were checked twice during today's office visit both have been above 140 mmHg. I have asked her to start checking her blood pressures at home and if elevated to discuss with PCP the role of reemphasized the importance of low-salt diet.  Orders Placed:  Orders Placed This Encounter  Procedures   LONG TERM MONITOR (  3-14 DAYS)    Standing Status:   Future    Standing Expiration Date:   09/12/2024    Order Specific Question:   Where should this test be performed?    Answer:   [facility]    Order Specific Question:   Does the patient have an implanted cardiac device?    Answer:   No    Order Specific Question:   Prescribed days of wear    Answer:   57    Order Specific Question:   Type of enrollment    Answer:   Clinic Enrollment   EKG 12-Lead   ECHOCARDIOGRAM COMPLETE    Standing Status:   Future    Standing Expiration Date:   09/12/2024    Order Specific Question:   Where should this test be performed    Answer:   [facility])    Order Specific Question:   Does the patient weigh less than or greater than 250 lbs?    Answer:   Patient weighs less than 250 lbs    Order Specific Question:   Perflutren DEFINITY (image enhancing agent) should be administered unless hypersensitivity or allergy exist    Answer:   Administer Perflutren    Order Specific Question:   Reason for exam-Echo    Answer:   Other-Full Diagnosis List    Order Specific Question:   Full ICD-10/Reason  for Exam    Answer:   Atrial tachycardia [facility]) [[id number]]    As part of medical decision making results of the Dr. Alford Highland progress note from June 2017, EKG, labs from August 2024 were reviewed independently at today's visit.   Final Medication List:    Meds ordered this encounter  Medications   metoprolol tartrate (LOPRESSOR) 25 MG tablet    Sig: Take 0.5 tablets (12.5 mg total) by mouth 2 (two) times daily.    Dispense:  180 tablet    Refill:  3    There are no discontinued medications.   Current Outpatient Medications:    acetaminophen (TYLENOL) 500 MG tablet, Take 500 mg by mouth daily as needed for moderate pain or headache., Disp: , Rfl:    Calcium Carb-Cholecalciferol (CALCIUM + VITAMIN D3 PO), Take by mouth., Disp: , Rfl:    desonide (DESOWEN) 0.05 % cream, Apply topically 2 (two) times daily. (Patient taking differently: Apply topically as needed.), Disp: 30 g, Rfl: 0   ferrous sulfate 325 (65 FE) MG tablet, Take 325 mg by mouth daily., Disp: , Rfl:    Flaxseed, Linseed, (FLAX SEED OIL) 1000 MG CAPS, Take 1,000 mg by mouth daily. evening, Disp: , Rfl:    fluticasone (FLONASE) 50 MCG/ACT nasal spray, Place 2 sprays into both nostrils daily. (Patient taking differently: Place 2 sprays into both nostrils as needed.), Disp: 16 g, Rfl: 1   Hypromellose (ARTIFICIAL TEARS OP), Apply 1 drop to eye daily as needed (dry eyes)., Disp: , Rfl:    levothyroxine (SYNTHROID) 75 MCG tablet, TAKE 1 TABLET(75 MCG) BY MOUTH DAILY, Disp: 90 tablet, Rfl: 3   metoprolol tartrate (LOPRESSOR) 25 MG tablet, Take 0.5 tablets (12.5 mg total) by mouth 2 (two) times daily., Disp: 180 tablet, Rfl: 3   Multiple Vitamin (MULTIVITAMIN) tablet, Take 1 tablet by mouth daily., Disp: , Rfl:    Omega-3 Fatty Acids (FISH OIL) 1000 MG CAPS, Take 1 capsule by mouth daily., Disp: , Rfl:    omeprazole (PRILOSEC) 20 MG capsule, Take 1 capsule (20 mg total) by  mouth daily., Disp: 90 capsule, Rfl: 3   pravastatin (PRAVACHOL)  40 MG tablet, Take 1 tablet (40 mg total) by mouth daily., Disp: 90 tablet, Rfl: 3  Consent:   NA  Disposition:   3 months with either myself or APP.  If seen by APP at 3 month visit, I would like to see her in 6 months.  Patient may be asked to follow-up sooner based on the results of the above-mentioned testing.  Her questions and concerns were addressed to her satisfaction. She voices understanding of the recommendations provided during this encounter.    Signed, Tessa Lerner, DO, [facility]  [facility]  [address] [place], [place] [address] 09/13/2023 11:49 AM

## 2023-09-13 NOTE — Patient Instructions (Addendum)
Medication Instructions:  Your physician has recommended you make the following change in your medication:   START Metoprolol Tartrate (Lopressor) 12.5 mg twice daily  - HOLD if systolic blood pressure (top number) is less than 100 and/or if your heart rate is less than 55   *If you need a refill on your cardiac medications before your next appointment, please call your pharmacy*  Lab Work: None ordered today. If you have labs (blood work) drawn today and your tests are completely normal, you will receive your results only by: MyChart Message (if you have MyChart) OR A paper copy in the mail If you have any lab test that is abnormal or we need to change your treatment, we will call you to review the results.  Testing/Procedures: Your physician has requested that you have an echocardiogram. Echocardiography is a painless test that uses sound waves to create images of your heart. It provides your doctor with information about the size and shape of your heart and how well your heart's chambers and valves are working. This procedure takes approximately one hour. There are no restrictions for this procedure. Please do NOT wear cologne, perfume, aftershave, or lotions (deodorant is allowed). Please arrive 15 minutes prior to your appointment time.  Please note: We ask at that you not bring children with you during ultrasound (echo/ vascular) testing. Due to room size and safety concerns, children are not allowed in the ultrasound rooms during exams. Our front office staff cannot provide observation of children in our lobby area while testing is being conducted. An adult accompanying a patient to their appointment will only be allowed in the ultrasound room at the discretion of the ultrasound technician under special circumstances. We apologize for any inconvenience.  Your physician has requested that you wear a Zio heart monitor for 14 days. This will be mailed to your home with instructions on how  to apply the monitor and how to return it when finished. Please allow 2 weeks after returning the heart monitor before our office calls you with the results.   Follow-Up: At [facility], you and your health needs are our priority.  As part of our continuing mission to provide you with exceptional heart care, we have created designated Provider Care Teams.  These Care Teams include your primary Cardiologist (physician) and Advanced Practice Providers (APPs -  Physician Assistants and Nurse Practitioners) who all work together to provide you with the care you need, when you need it.  Your next appointment:   3 month(s)  The format for your next appointment:   In Person  Provider:   Jari Favre, PA-C, Ronie Spies, PA-C, Robin Searing, NP, Jacolyn Reedy, PA-C, Eligha Bridegroom, NP, Tereso Newcomer, PA-C, or Perlie Gold, PA-C     Then, Tessa Lerner, DO will plan to see you again in 6 month(s).{  Other Instructions ZIO XT- Long Term Monitor Instructions     Your physician has requested you wear a ZIO patch monitor for 14 days.  This is a single patch monitor. [employer] supplies one patch monitor per enrollment. Additional  stickers are not available. Please do not apply patch if you will be having a Nuclear Stress Test,  Echocardiogram, Cardiac CT, MRI, or Chest Xray during the period you would be wearing the  monitor. The patch cannot be worn during these tests. You cannot remove and re-apply the  ZIO XT patch monitor.  Your ZIO patch monitor will be mailed 3 day USPS to your address on file.  It may take 3-5 days  to receive your monitor after you have been enrolled.  Once you have received your monitor, please review the enclosed instructions. Your monitor  has already been registered assigning a specific monitor serial # to you.     Billing and Patient Assistance Program Information     We have supplied [employer] with any of your insurance information on file for billing purposes.  [employer]  offers a sliding scale Patient Assistance Program for patients that do not have  insurance, or whose insurance does not completely cover the cost of the ZIO monitor.  You must apply for the Patient Assistance Program to qualify for this discounted rate.  To apply, please call [employer] at [phone], select option 4, select option 2, ask to apply for  Patient Assistance Program. Meredeth Ide will ask your household income, and how many people  are in your household. They will quote your out-of-pocket cost based on that information.  [employer] will also be able to set up a 20-month, interest-free payment plan if needed.     Applying the monitor     Shave hair from upper left chest.  Hold abrader disc by orange tab. Rub abrader in 40 strokes over the upper left chest as  indicated in your monitor instructions.  Clean area with 4 enclosed alcohol pads. Let dry.  Apply patch as indicated in monitor instructions. Patch will be placed under collarbone on left  side of chest with arrow pointing upward.  Rub patch adhesive wings for 2 minutes. Remove white label marked "1". Remove the white  label marked "2". Rub patch adhesive wings for 2 additional minutes.  While looking in a mirror, press and release button in center of patch. A small green light will  flash 3-4 times. This will be your only indicator that the monitor has been turned on.  Do not shower for the first 24 hours. You may shower after the first 24 hours.  Press the button if you feel a symptom. You will hear a small click. Record Date, Time and  Symptom in the Patient Logbook.  When you are ready to remove the patch, follow instructions on the last 2 pages of Patient  Logbook. Stick patch monitor onto the last page of Patient Logbook.  Place Patient Logbook in the blue and white box. Use locking tab on box and tape box closed  securely. The blue and white box has prepaid postage on it. Please place it in the mailbox as  soon as possible.  Your physician should have your test results approximately 7 days after the  monitor has been mailed back to [facility].  Call [facility] Customer Care at [phone] if you have questions regarding  your ZIO XT patch monitor. Call them immediately if you see an orange light blinking on your  monitor.  If your monitor falls off in less than 4 days, contact our Monitor department at [phone].  If your monitor becomes loose or falls off after 4 days call [employer] at [phone] for  suggestions on securing your monitor.

## 2023-09-22 DIAGNOSIS — M419 Scoliosis, unspecified: Secondary | ICD-10-CM | POA: Diagnosis not present

## 2023-09-22 DIAGNOSIS — S335XXD Sprain of ligaments of lumbar spine, subsequent encounter: Secondary | ICD-10-CM | POA: Diagnosis not present

## 2023-09-26 DIAGNOSIS — M545 Low back pain, unspecified: Secondary | ICD-10-CM | POA: Diagnosis not present

## 2023-10-06 DIAGNOSIS — R002 Palpitations: Secondary | ICD-10-CM | POA: Diagnosis not present

## 2023-10-06 DIAGNOSIS — I4719 Other supraventricular tachycardia: Secondary | ICD-10-CM | POA: Diagnosis not present

## 2023-10-13 ENCOUNTER — Telehealth: Payer: Self-pay

## 2023-10-13 ENCOUNTER — Telehealth: Payer: Self-pay | Admitting: Cardiology

## 2023-10-13 MED ORDER — METOPROLOL SUCCINATE ER 50 MG PO TB24: 50.0000 mg | ORAL_TABLET | Freq: Every day | ORAL | 3 refills | Status: DC

## 2023-10-13 NOTE — Telephone Encounter (Signed)
-----   Message from [facility] sent at 10/12/2023  6:18 PM EST ----- Underlying rhythm is sinus. The patient triggered events were associated with episodes of PSVT. Currently on Lopressor 12.5 mg p.o. twice daily. Have her discontinue Lopressor. Start Toprol-XL 50 mg p.o. daily and see how she does with holding parameters.  Sunit Marion, DO, [facility]

## 2023-10-13 NOTE — Telephone Encounter (Signed)
Spoke with patient and informed her that Dr. Odis Hollingshead switched her metoprolol to extended release. She verbalized understanding.

## 2023-10-13 NOTE — Telephone Encounter (Signed)
Pt c/o medication issue:  1. Name of Medication: Metoprolol  2. How are you currently taking this medication (dosage and times per day)?   3. Are you having a reaction (difficulty breathing--STAT)?   4. What is your medication issue? Metoprolol dose have been changed to 50 mg. Patient says she has some 25 mg of Metoprolol left.. Her question is, can she take 1 25 mg in the morning and 1 25 mg in the evening?

## 2023-10-13 NOTE — Telephone Encounter (Signed)
Spoke with patient concerning recent monitor results and Dr Emelda Brothers recommendation. Patient agrees with starting Toprol XL 50 mg once daily - confirmed follow up appointment on 12/13/23 - no questions at this time

## 2023-11-03 ENCOUNTER — Ambulatory Visit: Payer: Self-pay | Admitting: Family Medicine

## 2023-11-03 ENCOUNTER — Ambulatory Visit (HOSPITAL_COMMUNITY): Payer: Medicare PPO | Attending: Internal Medicine

## 2023-11-03 DIAGNOSIS — I080 Rheumatic disorders of both mitral and aortic valves: Secondary | ICD-10-CM | POA: Insufficient documentation

## 2023-11-03 DIAGNOSIS — I4719 Other supraventricular tachycardia: Secondary | ICD-10-CM | POA: Insufficient documentation

## 2023-11-03 DIAGNOSIS — I253 Aneurysm of heart: Secondary | ICD-10-CM | POA: Diagnosis not present

## 2023-11-03 DIAGNOSIS — E785 Hyperlipidemia, unspecified: Secondary | ICD-10-CM | POA: Diagnosis not present

## 2023-11-03 DIAGNOSIS — I351 Nonrheumatic aortic (valve) insufficiency: Secondary | ICD-10-CM | POA: Diagnosis not present

## 2023-11-03 LAB — ECHOCARDIOGRAM COMPLETE
Area-P 1/2: 2.34 cm2
S' Lateral: 3 cm

## 2023-11-03 NOTE — Telephone Encounter (Signed)
  Chief Complaint: High Blood pressure readings Symptoms: asymptomatic-160/72 on metoprolol Frequency: was told to notify her PCP about high readings Pertinent Negatives: Patient denies CP, SOB, headache, weakness Disposition: [] ED /[] Urgent Care (no appt availability in office) / [x] Appointment(In office/virtual)/ []  [facility]/ [] Home Care/ [] Refused Recommended Disposition /[] [facility]/ []  Follow-up with PCP Additional Notes: patient called with concerns for high BP readings. Patient with hx of palpitations on metoprolol currently.  Current BP reading of 160/72-asymptomatic but had similar readings with her daily BP checks over the last several weeks. Per protocol, appointment made for 11/07/2023 at 8:40 am. Patient verbalized understanding of plan and all questions answered.    Copied from CRM [id number]. Topic: General - Other >> Nov 03, 2023  1:30 PM Geneva B wrote: Reason for CRM: patient is calling in because she has went to see a cardiologist and her blood pressure is high and she wants to know levothyroxin rx elevate her blood pressure please call pt [phone] Reason for Disposition  [1] Systolic BP  >= 130 OR Diastolic >= 80 AND [2] taking BP medications  Answer Assessment - Initial Assessment Questions 1. BLOOD PRESSURE: "What is the blood pressure?" "Did you take at least two measurements 5 minutes apart?"     160/72 at the office-unable to take BP a second time as she is in a store 2. ONSET: "When did you take your blood pressure?"     Took in morning around 9:00  3. HOW: "How did you take your blood pressure?" (e.g., automatic home BP monitor, visiting nurse)     Automatic home BP monitor 4. HISTORY: "Do you have a history of high blood pressure?"     No history 5. MEDICINES: "Are you taking any medicines for blood pressure?" "Have you missed any doses recently?"     No medication for BP but is on metoprolol for palpitations  6. OTHER SYMPTOMS: "Do you  have any symptoms?" (e.g., blurred vision, chest pain, difficulty breathing, headache, weakness)     No symptoms  Protocols used: Blood Pressure - High-A-AH

## 2023-11-07 ENCOUNTER — Encounter: Payer: Self-pay | Admitting: Family Medicine

## 2023-11-07 ENCOUNTER — Ambulatory Visit: Payer: Medicare PPO | Admitting: Family Medicine

## 2023-11-07 VITALS — BP 136/62 | HR 61 | Temp 97.5°F | Ht 63.0 in | Wt 131.8 lb

## 2023-11-07 DIAGNOSIS — I4719 Other supraventricular tachycardia: Secondary | ICD-10-CM

## 2023-11-07 DIAGNOSIS — E039 Hypothyroidism, unspecified: Secondary | ICD-10-CM | POA: Diagnosis not present

## 2023-11-07 DIAGNOSIS — I10 Essential (primary) hypertension: Secondary | ICD-10-CM

## 2023-11-07 DIAGNOSIS — R030 Elevated blood-pressure reading, without diagnosis of hypertension: Secondary | ICD-10-CM | POA: Insufficient documentation

## 2023-11-07 HISTORY — DX: Essential (primary) hypertension: I10

## 2023-11-07 LAB — CBC WITH DIFFERENTIAL/PLATELET
Basophils Absolute: 0 10*3/uL (ref 0.0–0.1)
Basophils Relative: 0.8 % (ref 0.0–3.0)
Eosinophils Absolute: 0.1 10*3/uL (ref 0.0–0.7)
Eosinophils Relative: 1.8 % (ref 0.0–5.0)
HCT: 36.9 % (ref 36.0–46.0)
Hemoglobin: 12.4 g/dL (ref 12.0–15.0)
Lymphocytes Relative: 24.3 % (ref 12.0–46.0)
Lymphs Abs: 1.4 10*3/uL (ref 0.7–4.0)
MCHC: 33.5 g/dL (ref 30.0–36.0)
MCV: 95.9 fL (ref 78.0–100.0)
Monocytes Absolute: 0.8 10*3/uL (ref 0.1–1.0)
Monocytes Relative: 12.8 % — ABNORMAL HIGH (ref 3.0–12.0)
Neutro Abs: 3.6 10*3/uL (ref 1.4–7.7)
Neutrophils Relative %: 60.3 % (ref 43.0–77.0)
Platelets: 274 10*3/uL (ref 150.0–400.0)
RBC: 3.85 Mil/uL — ABNORMAL LOW (ref 3.87–5.11)
RDW: 12.9 % (ref 11.5–15.5)
WBC: 5.9 10*3/uL (ref 4.0–10.5)

## 2023-11-07 LAB — BASIC METABOLIC PANEL
BUN: 16 mg/dL (ref 6–23)
CO2: 29 meq/L (ref 19–32)
Calcium: 9.6 mg/dL (ref 8.4–10.5)
Chloride: 102 meq/L (ref 96–112)
Creatinine, Ser: 0.59 mg/dL (ref 0.40–1.20)
GFR: 85.72 mL/min (ref >60.00)
Glucose, Bld: 86 mg/dL (ref 70–99)
Potassium: 4.2 meq/L (ref 3.5–5.1)
Sodium: 138 meq/L (ref 135–145)

## 2023-11-07 LAB — TSH: TSH: 0.64 u[IU]/mL (ref 0.35–5.50)

## 2023-11-07 NOTE — Assessment & Plan Note (Signed)
Patient with a history of atrial tachycardia diagnosed in 2018. Currently managed with metoprolol succinate 50?mg daily. Reports improvement in palpitations since dosage increase in November 2024 after wearing a Zio Patch monitor. Occasional episodes of feeling heart beating fast but overall symptom control is improved.  Plan: Continue metoprolol succinate 50?mg daily as prescribed. Monitor heart rate and record any episodes of palpitations. Follow up with cardiologist, Dr. Jovita Gamma, on February 25th.

## 2023-11-07 NOTE — Progress Notes (Signed)
Assessment/Plan:   Problem List Items Addressed This Visit       Cardiovascular and Mediastinum   Atrial tachycardia [facility])   Patient with a history of atrial tachycardia diagnosed in 2018. Currently managed with metoprolol succinate 50?mg daily. Reports improvement in palpitations since dosage increase in November 2024 after wearing a Zio Patch monitor. Occasional episodes of feeling heart beating fast but overall symptom control is improved.  Plan: Continue metoprolol succinate 50?mg daily as prescribed. Monitor heart rate and record any episodes of palpitations. Follow up with cardiologist, Dr. Jovita Gamma, on February 25th.       Relevant Orders   CBC w/Diff   Primary hypertension - Primary   Patient reports elevated systolic blood pressure at home ranging from 132 to 160?mmHg. In-office readings show hypotension with diastolic pressures as low as 58?mmHg. Possible discrepancy due to equipment variation or overmedication with beta-blocker.  Plan: Patient to bring home blood pressure cuff in 2 weeks for comparison to office Continue to monitor and record blood pressure and heart rate at home. No adjustment to metoprolol dosing at this time; pending cardiology consultation. Recheck baseline electrolytes and CBC Follow-up sooner if symptoms of dizziness or hypotension occur.  Go to the emergency department if symptoms are severe      Relevant Orders   Basic Metabolic Panel (BMET)     Endocrine   Hypothyroidism   Stable.  Plan: Order TSH  Continue levothyroxine 75 mcg daily      Relevant Orders   TSH    There are no discontinued medications.  Return in about 2 weeks (around 11/21/2023) for BP.    Subjective:   Encounter date: 11/07/2023  Nancy Cuevas is a 78 y.o. female who has Hypothyroidism; Hyperlipidemia; Female stress incontinence; History of colonic polyps; Atrial tachycardia (HCC); Stricture and stenosis of esophagus; Osteopenia; Hematuria; Aortic  atherosclerosis (HCC); Hiatal hernia; Varicose veins of both lower extremities; Allergic rhinitis; Recurrent rectal polyp; Genitourinary syndrome of menopause; and Primary hypertension on their problem list..   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), Bunion, Cataract, Contact dermatitis and other eczema, due to unspecified cause, Contact dermatitis and other eczema, due to unspecified cause, COVID, Disorder of bone and cartilage, unspecified, Female stress incontinence, GERD (gastroesophageal reflux disease), HYPERLIPIDEMIA (10/27/2010), Hyperpotassemia, HYPOTHYROIDISM (07/18/2007), Nonspecific (abnormal) findings on radiological and other examination of other intrathoracic organs, OSTEOPENIA (03/24/2008), Osteopenia, Osteoporosis, Palpitations, Personal history of colonic and rectal adenomas (03/22/2013), POSTMENOPAUSAL STATUS (02/07/2008), Primary hypertension (11/07/2023), and Symptomatic menopausal or female climacteric states..    Chief Complaint: Concerns about elevated blood pressure and palpitations.  History of Present Illness:  Patient presents with concerns of elevated blood pressure readings at home and intermittent palpitations. Diagnosed with atrial tachycardia in 2018. Last cardiology evaluation was in November 2024 with Dr. Jovita Gamma.  Patient had Zio Patch monitor in 09/2023 and it was worn for two weeks leading to an increase in metoprolol succinate to 50?mg daily. Since the dosage adjustment, patient reports improvement in palpitations but occasionally feels the heart beating fast. Home blood pressure readings range from 127/65?mmHg to as high as 160/85 ?mmHg systolic. Denies chest pain, shortness of breath, dizziness, or lightheadedness. Expresses concern about current levothyroxine dosing; dosage was increased within the past year.  Review of Systems:  Constitutional: Denies weight change,  fatigue. Cardiovascular: Reports occasional palpitations; denies chest pain or edema. Respiratory: Denies shortness of breath or cough. Neurological: Denies dizziness or  syncope. Endocrine: Denies heat or cold intolerance. Remainder of systems: Negative.  Past Surgical History:  Procedure Laterality Date   ABDOMINAL HYSTERECTOMY  1988   prolapse uterus   BUNIONECTOMY Right 10/2014   CARPAL TUNNEL RELEASE  11/15/2011   Procedure: CARPAL TUNNEL RELEASE;  Surgeon: Tami Ribas, MD;  Location: [facility];  Service: Orthopedics;  Laterality: Left;   CATARACT EXTRACTION Left 2011   CATARACT EXTRACTION Right 06/24/2023   COLONOSCOPY  [id number]   normal,   COLONOSCOPY WITH PROPOFOL N/A 06/24/2017   Procedure: COLONOSCOPY WITH PROPOFOL;  Surgeon: Iva Boop, MD;  Location: [facility];  Service: Endoscopy;  Laterality: N/A;   EYE SURGERY     FRACTURE SURGERY  2006   HAND SURGERY  oct 2012 and jan 2013   Dr Dean--[facility]   HOT HEMOSTASIS N/A 06/24/2017   Procedure: HOT HEMOSTASIS (ARGON PLASMA COAGULATION/BICAP);  Surgeon: Iva Boop, MD;  Location: [facility];  Service: Endoscopy;  Laterality: N/A;   left wrist hardware removal 10/12  07/2011   OPEN REDUCTION INTERNAL FIXATION (ORIF) DISTAL RADIAL FRACTURE Left 03/2005   left wrist   PARTIAL PROCTECTOMY BY TEM  06/22/2013   Procedure: PARTIAL PROCTECTOMY BY TEM;  Surgeon: Romie Levee, MD;  Location: [facility];  Service: General;;   POLYPECTOMY     RETINAL DETACHMENT SURGERY  2009   left   TRANSANAL EXCISION OF RECTAL MASS N/A 03/18/2022   Procedure: TRANSANAL EXCISION RECTAL POLYP;  Surgeon: Romie Levee, MD;  Location: [facility];  Service: General;  Laterality: N/A;    Outpatient Medications Prior to Visit  Medication Sig Dispense Refill   acetaminophen (TYLENOL) 500 MG tablet Take 500 mg by mouth daily as needed for moderate pain or headache.     Calcium Carb-Cholecalciferol  (CALCIUM + VITAMIN D3 PO) Take by mouth.     desonide (DESOWEN) 0.05 % cream Apply topically 2 (two) times daily. (Patient taking differently: Apply topically as needed.) 30 g 0   ferrous sulfate 325 (65 FE) MG tablet Take 325 mg by mouth daily.     Flaxseed, Linseed, (FLAX SEED OIL) 1000 MG CAPS Take 1,000 mg by mouth daily. evening     fluticasone (FLONASE) 50 MCG/ACT nasal spray Place 2 sprays into both nostrils daily. (Patient taking differently: Place 2 sprays into both nostrils as needed.) 16 g 1   Hypromellose (ARTIFICIAL TEARS OP) Apply 1 drop to eye daily as needed (dry eyes).     levothyroxine (SYNTHROID) 75 MCG tablet TAKE 1 TABLET(75 MCG) BY MOUTH DAILY 90 tablet 3   metoprolol succinate (TOPROL XL) 50 MG 24 hr tablet Take 1 tablet (50 mg total) by mouth daily. Take with or immediately following a meal. 90 tablet 3   Multiple Vitamin (MULTIVITAMIN) tablet Take 1 tablet by mouth daily.     Omega-3 Fatty Acids (FISH OIL) 1000 MG CAPS Take 1 capsule by mouth daily.     omeprazole (PRILOSEC) 20 MG capsule Take 1 capsule (20 mg total) by mouth daily. 90 capsule 3   pravastatin (PRAVACHOL) 40 MG tablet Take 1 tablet (40 mg total) by mouth daily. 90 tablet 3   No facility-administered medications prior to visit.    Family History  Problem Relation Age of Onset   Diabetes Mother        1st degree relative   Hypertension Mother    Stroke Mother    Heart disease Father    Cancer Maternal Aunt  Heart disease Maternal Uncle    ALS Paternal Aunt    Diabetes Paternal Uncle    Heart disease Maternal Grandfather    Heart disease Paternal Grandfather    Cancer Cousin    Heart disease Cousin    Alzheimer's disease Cousin    Osteoporosis Other        1st degree relative   Colon cancer Neg Hx    Esophageal cancer Neg Hx    Stomach cancer Neg Hx    Rectal cancer Neg Hx    Colon polyps Neg Hx    Crohn's disease Neg Hx     Social History   Socioeconomic History   Marital status:  Married    Spouse name: Not on file   Number of children: 2   Years of education: Not on file   Highest education level: Master's degree (e.g., MA, MS, MEng, MEd, MSW, MBA)  Occupational History   Occupation: Retired  Tobacco Use   Smoking status: Never    Passive exposure: Past (years ago 15 years ago when husband smokked)   Smokeless tobacco: Never  Vaping Use   Vaping status: Never Used  Substance and Sexual Activity   Alcohol use: No   Drug use: No   Sexual activity: Not Currently    Birth control/protection: None  Other Topics Concern   Not on file  Social History Narrative   Married one son one daughter and 3 grandchildren    Likes to travel   No alcohol tobacco or drug use reported   Social Drivers of Corporate investment banker Strain: Low Risk  (11/04/2023)   Overall Financial Resource Strain (CARDIA)    Difficulty of Paying Living Expenses: Not hard at all  Food Insecurity: No Food Insecurity (11/04/2023)   Hunger Vital Sign    Worried About Running Out of Food in the Last Year: Never true    Ran Out of Food in the Last Year: Never true  Transportation Needs: No Transportation Needs (11/04/2023)   PRAPARE - Administrator, Civil Service (Medical): No    Lack of Transportation (Non-Medical): No  Physical Activity: Insufficiently Active (11/04/2023)   Exercise Vital Sign    Days of Exercise per Week: 5 days    Minutes of Exercise per Session: 20 min  Stress: No Stress Concern Present (11/04/2023)   [employer] of Occupational Health - Occupational Stress Questionnaire    Feeling of Stress : Not at all  Social Connections: Socially Integrated (11/04/2023)   Social Connection and Isolation Panel [NHANES]    Frequency of Communication with Friends and Family: Three times a week    Frequency of Social Gatherings with Friends and Family: Once a week    Attends Religious Services: More than 4 times per year    Active Member of [employer] or Organizations: Yes     Attends Banker Meetings: More than 4 times per year    Marital Status: Married  Catering manager Violence: Not At Risk (11/18/2021)   Humiliation, Afraid, Rape, and Kick questionnaire    Fear of Current or Ex-Partner: No    Emotionally Abused: No    Physically Abused: No    Sexually Abused: No  Objective:  Physical Exam: BP 136/62 (BP Location: Left Arm, Patient Position: Sitting, Cuff Size: Normal)   Pulse 61   Temp (!) 97.5 F (36.4 C)   Ht 5\' 3"  (1.6 m)   Wt 131 lb 12.8 oz (59.8 kg)   SpO2 98%   BMI 23.35 kg/m     Physical Exam Constitutional:      General: She is not in acute distress.    Appearance: Normal appearance. She is not ill-appearing or toxic-appearing.  HENT:     Head: Normocephalic and atraumatic.     Nose: Nose normal. No congestion.  Eyes:     General: No scleral icterus.    Extraocular Movements: Extraocular movements intact.  Cardiovascular:     Rate and Rhythm: Normal rate and regular rhythm.     Pulses: Normal pulses.     Heart sounds: Normal heart sounds.  Pulmonary:     Effort: Pulmonary effort is normal. No respiratory distress.     Breath sounds: Normal breath sounds.  Abdominal:     General: Abdomen is flat. Bowel sounds are normal.     Palpations: Abdomen is soft.  Musculoskeletal:        General: Normal range of motion.  Lymphadenopathy:     Cervical: No cervical adenopathy.  Skin:    General: Skin is warm and dry.     Findings: No rash.  Neurological:     General: No focal deficit present.     Mental Status: She is alert and oriented to person, place, and time. Mental status is at baseline.  Psychiatric:        Mood and Affect: Mood normal.        Behavior: Behavior normal.        Thought Content: Thought content normal.        Judgment: Judgment normal.    Orthostatic VS for the past 72 hrs (Last 3 readings):   Orthostatic BP Patient Position BP Location Cuff Size Orthostatic Pulse  11/07/23 0913 118/58 Standing Left Arm Small 60  11/07/23 0912 134/60 Sitting Left Arm Small 61  11/07/23 0911 124/64 Supine Right Arm Small 60    ECHOCARDIOGRAM COMPLETE Result Date: 11/03/2023    ECHOCARDIOGRAM REPORT   Patient Name:   Nancy Cuevas Date of Exam: 11/03/2023 Medical Rec #:  [id number]       Height:       63.0 in Accession #:    [id number]      Weight:       133.0 lb Date of Birth:  Mar 26, 1946       BSA:          1.626 m Patient Age:    89 years        BP:           160/74 mmHg Patient Gender: F               HR:           53 bpm. Exam Location:  [address] Procedure: 2D Echo, 3D Echo, Cardiac Doppler, Color Doppler and Strain Analysis Indications:    I47.19 Atrial Tachycardia  History:        Patient has prior history of Echocardiogram examinations, most                 recent 07/06/2013. Signs/Symptoms:Palpitations; Risk Factors:HLD.  Sonographer:    Clearence Ped RCS Referring Phys: [id number] SUNIT TOLIA IMPRESSIONS  1. Left ventricular ejection fraction, by estimation, is  55 to 60%. Left ventricular ejection fraction by 3D volume is 54 %. The left ventricle has normal function. The left ventricle has no regional wall motion abnormalities. Left ventricular diastolic  parameters are indeterminate. The average left ventricular global longitudinal strain is -19.5 %. The global longitudinal strain is normal.  2. Right ventricular systolic function is normal. The right ventricular size is normal. There is normal pulmonary artery systolic pressure. The estimated right ventricular systolic pressure is 18.8 mmHg.  3. Left atrial size was mildly dilated.  4. 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  5. The mitral valve is degenerative. Mild mitral valve regurgitation. No evidence of mitral stenosis.  6. The aortic valve is tricuspid. There is mild calcification of the aortic  valve. There is mild thickening of the aortic valve. Aortic valve regurgitation is mild. No aortic stenosis is present.  7. The inferior vena cava is normal in size with greater than 50% respiratory variability, suggesting right atrial pressure of 3 mmHg. FINDINGS  Left Ventricle: Left ventricular ejection fraction, by estimation, is 55 to 60%. Left ventricular ejection fraction by 3D volume is 54 %. The left ventricle has normal function. The left ventricle has no regional wall motion abnormalities. The average left ventricular global longitudinal strain is -19.5 %. The global longitudinal strain is normal. The left ventricular internal cavity size was normal in size. There is no left ventricular hypertrophy. Left ventricular diastolic parameters are indeterminate. Right Ventricle: The right ventricular size is normal. No increase in right ventricular wall thickness. Right ventricular systolic function is normal. There is normal pulmonary artery systolic pressure. The tricuspid regurgitant velocity is 1.99 m/s, and  with an assumed right atrial pressure of 3 mmHg, the estimated right ventricular systolic pressure is 18.8 mmHg. Left Atrium: Left atrial size was mildly dilated. Right Atrium: Right atrial size was normal in size. Pericardium: There is no evidence of pericardial effusion. Presence of epicardial fat layer. Mitral Valve: The mitral valve is degenerative in appearance. Mild mitral annular calcification. Mild mitral valve regurgitation. No evidence of mitral valve stenosis. Tricuspid Valve: The tricuspid valve is normal in structure. Tricuspid valve regurgitation is mild . No evidence of tricuspid stenosis. Aortic Valve: The aortic valve is tricuspid. There is mild calcification of the aortic valve. There is mild thickening of the aortic valve. Aortic valve regurgitation is mild. No aortic stenosis is present. Pulmonic Valve: The pulmonic valve was not well visualized. Pulmonic valve regurgitation is  trivial. No evidence of pulmonic stenosis. Aorta: The aortic root is normal in size and structure. Venous: The inferior vena cava is normal in size with greater than 50% respiratory variability, suggesting right atrial pressure of 3 mmHg. IAS/Shunts: The interatrial septum is aneurysmal. There is right bowing of the interatrial septum, suggestive of elevated left atrial pressure. No atrial level shunt detected by color flow Doppler.  LEFT VENTRICLE PLAX 2D LVIDd:         4.60 cm         Diastology LVIDs:         3.00 cm         LV e' medial:    6.64 cm/s LV PW:         0.90 cm         LV E/e' medial:  9.4 LV IVS:        0.70 cm         LV e' lateral:   8.59 cm/s LVOT  diam:     2.05 cm         LV E/e' lateral: 7.3 LV SV:         80 LV SV Index:   49              2D LVOT Area:     3.30 cm        Longitudinal                                Strain                                2D Strain GLS  -19.5 %                                Avg:                                 3D Volume EF                                LV 3D EF:    Left                                             ventricul                                             ar                                             ejection                                             fraction                                             by 3D                                             volume is                                             54 %.                                 3D Volume EF:  3D EF:        54 %                                LV EDV:       113 ml                                LV ESV:       52 ml                                LV SV:        61 ml RIGHT VENTRICLE RV Basal diam:  3.00 cm RV S prime:     15.70 cm/s TAPSE (M-mode): 2.4 cm RVSP:           18.8 mmHg LEFT ATRIUM             Index        RIGHT ATRIUM           Index LA diam:        3.30 cm 2.03 cm/m   RA Pressure: 3.00 mmHg LA Vol (A2C):   34.9 ml 21.47 ml/m  RA Area:     14.20  cm LA Vol (A4C):   32.4 ml 19.93 ml/m  RA Volume:   32.80 ml  20.17 ml/m LA Biplane Vol: 36.8 ml 22.63 ml/m  AORTIC VALVE LVOT Vmax:   91.20 cm/s LVOT Vmean:  59.200 cm/s LVOT VTI:    0.243 m  AORTA Ao Root diam: 3.40 cm Ao Asc diam:  3.20 cm MITRAL VALVE               TRICUSPID VALVE MV Area (PHT):             TR Peak grad:   15.8 mmHg MV Decel Time:             TR Vmax:        199.00 cm/s MV E velocity: 62.40 cm/s  Estimated RAP:  3.00 mmHg MV A velocity: 71.30 cm/s  RVSP:           18.8 mmHg MV E/A ratio:  0.88                            SHUNTS                            Systemic VTI:  0.24 m                            Systemic Diam: 2.05 cm Weston Brass MD Electronically signed by Weston Brass MD Signature Date/Time: 11/03/2023/2:36:36 PM    Final    LONG TERM MONITOR (3-14 DAYS) Result Date: 10/12/2023 Cardiac monitor (Zio Patch): 09/13/2023 - 09/27/2023 Dominant rhythm sinus. Heart rate 43-222 bpm.  Avg HR 66 bpm. No atrial fibrillation detected during the monitoring period. No ventricular tachycardia, high grade AV block, pauses (3 seconds or longer). Total supraventricular ectopic burden <1%. Episodes of supraventricular tachycardia: Fastest 74 beats, 23.3 seconds, average HR 192 bpm, max HR 222 bpm -suggestive of AVNRT physiology. Total ventricular ectopic burden <1%. Patient triggered events: 10. Patient triggered events illustrated  predominantly sinus tachycardia with also episodes of PSVT initiation.    Recent Results (from the past 2160 hours)  ECHOCARDIOGRAM COMPLETE     Status: None   Collection Time: 11/03/23 11:28 AM  Result Value Ref Range   Area-P 1/2 2.34 cm2   S' Lateral 3.00 cm   Est EF 55 - 60%         Garner Nash, MD, MS

## 2023-11-07 NOTE — Telephone Encounter (Signed)
Patient in office to be seen this morning.

## 2023-11-07 NOTE — Patient Instructions (Signed)
Continue taking your prescribed medications, including metoprolol and levothyroxine, as directed.  Schedule a follow-up appointment with your cardiologist, Dr. Jovita Gamma, within the next month.  Bring your home blood pressure cuff to your next visit so we can compare readings.  Monitor your blood pressure and heart rate at home, and record the readings.  Return in two weeks for a blood pressure check and to review your lab results.

## 2023-11-07 NOTE — Assessment & Plan Note (Addendum)
Patient reports elevated systolic blood pressure at home ranging from 132 to 160?mmHg. In-office readings show hypotension with diastolic pressures as low as 58?mmHg. Possible discrepancy due to equipment variation or overmedication with beta-blocker.  Plan: Patient to bring home blood pressure cuff in 2 weeks for comparison to office Continue to monitor and record blood pressure and heart rate at home. No adjustment to metoprolol dosing at this time; pending cardiology consultation. Recheck baseline electrolytes and CBC Follow-up sooner if symptoms of dizziness or hypotension occur.  Go to the emergency department if symptoms are severe

## 2023-11-07 NOTE — Assessment & Plan Note (Signed)
Stable.  Plan: Order TSH  Continue levothyroxine 75 mcg daily

## 2023-11-22 ENCOUNTER — Encounter: Payer: Self-pay | Admitting: Family Medicine

## 2023-11-22 ENCOUNTER — Ambulatory Visit: Payer: Medicare PPO | Admitting: Family Medicine

## 2023-11-22 VITALS — BP 120/62 | HR 66 | Temp 97.9°F | Ht 63.0 in | Wt 133.0 lb

## 2023-11-22 DIAGNOSIS — I471 Supraventricular tachycardia, unspecified: Secondary | ICD-10-CM | POA: Diagnosis not present

## 2023-11-22 DIAGNOSIS — E039 Hypothyroidism, unspecified: Secondary | ICD-10-CM | POA: Diagnosis not present

## 2023-11-22 DIAGNOSIS — R030 Elevated blood-pressure reading, without diagnosis of hypertension: Secondary | ICD-10-CM

## 2023-11-22 NOTE — Assessment & Plan Note (Signed)
TSH is at goal. Continue levothyroxine 75 mcg daily.

## 2023-11-22 NOTE — Progress Notes (Signed)
 [facility] [address] [place] [address] Dept: [phone] Dept Fax: [phone]  Office Visit  Subjective:    Patient ID: Nancy Cuevas, female    DOB: Jan 25, 1946, 78 y.o..   MRN: [id number]  Chief Complaint  Patient presents with   Hypertension    2 week fu BP.  Average BP 118/51, 148/46, 160/76   History of Present Illness:  Patient is in today for reassessment of her blood pressure. Nancy Cuevas was seen by Dr. Michele (cardiology) in Nov. She happened to have an elevated blood pressure that day (160/76). She did not have a prior history of hypertension. He recommended she start checking her BPA at home more frequently. He ordered a Zio patch study and had her start back to taking metoprolol  tartrate 12.5 mg bid. As the Zio patch did show episodes of PSVT, he switched her to metoprolol  succinate 50 mg daily. Nancy Cuevas was seen by Dr. Sebastian on 11/07/2023 as she was seeing ongoing systolic hypertension at home, with a 7-day average of 144/75. Dr. Sebastian ordered some lab tests and recommended she continue to monitor this at home.   Nancy Cuevas notes she has not had any symptoms of tachycardia, lightheadedness/orthostasis, or dyspnea. She has continue to monitor her blood pressures, with a 5-day average of 132/58.  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   Recurrent rectal polyp 03/05/2022   Allergic rhinitis 09/29/2021   Varicose veins of both lower extremities 08/21/2021   Aortic atherosclerosis (HCC) 08/12/2021   Hiatal hernia 08/12/2021   Hematuria 06/18/2021   Atrial tachycardia (HCC) 09/04/2019   Stricture and stenosis of esophagus 09/04/2019   Osteopenia 09/04/2019   History of colonic polyps 03/22/2013   Hyperlipidemia 10/27/2010   Female stress incontinence 10/27/2010   Hypothyroidism  07/18/2007   Past Surgical History:  Procedure Laterality Date   ABDOMINAL HYSTERECTOMY  1988   prolapse uterus   BUNIONECTOMY Right 10/2014   CARPAL TUNNEL RELEASE  11/15/2011   Procedure: CARPAL TUNNEL RELEASE;  Surgeon: Franky JONELLE Curia, MD;  Location: [facility];  Service: Orthopedics;  Laterality: Left;   CATARACT EXTRACTION Left 2011   CATARACT EXTRACTION Right 06/24/2023   COLONOSCOPY  [id number]   normal,   COLONOSCOPY WITH PROPOFOL  N/A 06/24/2017   Procedure: COLONOSCOPY WITH PROPOFOL ;  Surgeon: Avram Lupita BRAVO, MD;  Location: [facility];  Service: Endoscopy;  Laterality: N/A;   EYE SURGERY     FRACTURE SURGERY  2006   HAND SURGERY  oct 2012 and jan 2013   Dr Dean--[facility]   HOT HEMOSTASIS N/A 06/24/2017   Procedure: HOT HEMOSTASIS (ARGON PLASMA COAGULATION/BICAP);  Surgeon: Avram Lupita BRAVO, MD;  Location: [facility];  Service: Endoscopy;  Laterality: N/A;   left wrist hardware removal 10/12  07/2011   OPEN REDUCTION INTERNAL FIXATION (ORIF) DISTAL RADIAL FRACTURE Left 03/2005   left wrist   PARTIAL PROCTECTOMY BY TEM  06/22/2013   Procedure: PARTIAL PROCTECTOMY BY TEM;  Surgeon: Bernarda Ned, MD;  Location: [facility];  Service: General;;   POLYPECTOMY     RETINAL DETACHMENT SURGERY  2009   left   TRANSANAL EXCISION OF RECTAL MASS N/A 03/18/2022   Procedure: TRANSANAL EXCISION RECTAL POLYP;  Surgeon: Ned Bernarda, MD;  Location: [facility] [place];  Service: General;  Laterality: N/A;   Family History  Problem Relation Age  of Onset   Diabetes Mother        1st degree relative   Hypertension Mother    Stroke Mother    Heart disease Father    Cancer Maternal Aunt    Heart disease Maternal Uncle    ALS Paternal Aunt    Diabetes Paternal Uncle    Heart disease Maternal Grandfather    Heart disease Paternal Grandfather    Cancer Cousin    Heart disease Cousin    Alzheimer's disease Cousin    Osteoporosis Other        1st degree  relative   Colon cancer Neg Hx    Esophageal cancer Neg Hx    Stomach cancer Neg Hx    Rectal cancer Neg Hx    Colon polyps Neg Hx    Crohn's disease Neg Hx    Outpatient Medications Prior to Visit  Medication Sig Dispense Refill   acetaminophen  (TYLENOL ) 500 MG tablet Take 500 mg by mouth daily as needed for moderate pain or headache.     Calcium Carb-Cholecalciferol (CALCIUM + VITAMIN D3 PO) Take by mouth.     desonide  (DESOWEN ) 0.05 % cream Apply topically 2 (two) times daily. (Patient taking differently: Apply topically as needed.) 30 g 0   ferrous sulfate 325 (65 FE) MG tablet Take 325 mg by mouth daily.     Flaxseed, Linseed, (FLAX SEED OIL) 1000 MG CAPS Take 1,000 mg by mouth daily. evening     fluticasone  (FLONASE ) 50 MCG/ACT nasal spray Place 2 sprays into both nostrils daily. (Patient taking differently: Place 2 sprays into both nostrils as needed.) 16 g 1   Hypromellose (ARTIFICIAL TEARS OP) Apply 1 drop to eye daily as needed (dry eyes).     levothyroxine  (SYNTHROID ) 75 MCG tablet TAKE 1 TABLET(75 MCG) BY MOUTH DAILY 90 tablet 3   metoprolol  succinate (TOPROL  XL) 50 MG 24 hr tablet Take 1 tablet (50 mg total) by mouth daily. Take with or immediately following a meal. 90 tablet 3   Multiple Vitamin (MULTIVITAMIN) tablet Take 1 tablet by mouth daily.     Omega-3 Fatty Acids (FISH OIL) 1000 MG CAPS Take 1 capsule by mouth daily.     omeprazole  (PRILOSEC) 20 MG capsule Take 1 capsule (20 mg total) by mouth daily. 90 capsule 3   pravastatin  (PRAVACHOL ) 40 MG tablet Take 1 tablet (40 mg total) by mouth daily. 90 tablet 3   No facility-administered medications prior to visit.   Allergies  Allergen Reactions   Sulfonamide Derivatives Rash     Objective:   Today's Vitals   11/22/23 0842  BP: 120/62  Pulse: 66  Temp: 97.9 F (36.6 C)  TempSrc: Temporal  SpO2: 100%  Weight: 133 lb (60.3 kg)  Height: 5' 3 (1.6 m)   Body mass index is 23.56 kg/m.   General: Well  developed, well nourished. No acute distress. Psych: Alert and oriented. Normal mood and affect.  Health Maintenance Due  Topic Date Due   DTaP/Tdap/Td (2 - Tdap) 02/06/2018   Medicare Annual Wellness (AWV)  11/23/2023   Lab Results    Latest Ref Rng & Units 11/07/2023    9:43 AM 07/06/2023   10:34 AM 10/16/2021    9:02 AM  CBC  WBC 4.0 - 10.5 K/uL 5.9  5.5  4.4   Hemoglobin 12.0 - 15.0 g/dL 87.5  87.7  87.5   Hematocrit 36.0 - 46.0 % 36.9  37.4  36.8   Platelets 150.0 - 400.0  K/uL 274.0  273.0  269.0       Latest Ref Rng & Units 11/07/2023    9:43 AM 10/16/2021    9:02 AM 02/26/2021    2:19 PM  CMP  Glucose 70 - 99 mg/dL 86  76  89   BUN 6 - 23 mg/dL 16  13  18    Creatinine 0.40 - 1.20 mg/dL 9.40  9.33  9.21   Sodium 135 - 145 mEq/L 138  139  142   Potassium 3.5 - 5.1 mEq/L 4.2  4.2  3.8   Chloride 96 - 112 mEq/L 102  101  104   CO2 19 - 32 mEq/L 29  32  27   Calcium 8.4 - 10.5 mg/dL 9.6  9.6  9.6   AST 0 - 37 U/L   20   ALT 0 - 35 U/L   13    Last thyroid  functions Lab Results  Component Value Date   TSH 0.64 11/07/2023     Assessment & Plan:   Problem List Items Addressed This Visit       Cardiovascular and Mediastinum   PSVT (paroxysmal supraventricular tachycardia) (HCC)   Recently evaluated on Zio patch. Now on metoprolol  succinate 50 mg daily. Although she is mildly bradycardic at times, she is tolerating this. She will follow up with Dr. Michele as scheduled on 12/13/2023.        Endocrine   Hypothyroidism   TSH is at goal. Continue levothyroxine  75 mcg daily.        Other   Elevated blood-pressure reading, without diagnosis of hypertension - Primary   In comparing Nancy Cuevas's home blood pressure cuff to our manual cuff int he office, her machine is reading the systolic BP ~ 10 mmHg higher. The more recent BPs are normal on average, taking into consideration this difference in reads. Her bP int he office today is normal. She is asymptomatic. I feel she  should continue her metoprolol . I do not feel that she has hypertension at this point, but this does bear monitoring.       Return in about 7 months (around 06/21/2024) for Annual preventative care.   Garnette CHRISTELLA Simpler, MD

## 2023-11-22 NOTE — Assessment & Plan Note (Signed)
 In comparing Nancy Cuevas's home blood pressure cuff to our manual cuff int he office, her machine is reading the systolic BP ~ 10 mmHg higher. The more recent BPs are normal on average, taking into consideration this difference in reads. Her bP int he office today is normal. She is asymptomatic. I feel she should continue her metoprolol . I do not feel that she has hypertension at this point, but this does bear monitoring.

## 2023-11-22 NOTE — Assessment & Plan Note (Signed)
Recently evaluated on Zio patch. Now on metoprolol succinate 50 mg daily. Although she is mildly bradycardic at times, she is tolerating this. She will follow up with Dr. Odis Hollingshead as scheduled on 12/13/2023.

## 2023-11-24 ENCOUNTER — Ambulatory Visit: Payer: Medicare PPO

## 2023-11-24 DIAGNOSIS — Z0000 Encounter for general adult medical examination without abnormal findings: Secondary | ICD-10-CM

## 2023-11-24 NOTE — Patient Instructions (Signed)
 Nancy Cuevas , Thank you for taking time to come for your Medicare Wellness Visit. I appreciate your ongoing commitment to your health goals. Please review the following plan we discussed and let me know if I can assist you in the future.   Referrals/Orders/Follow-Ups/Clinician Recommendations: none  This is a list of the screening recommended for you and due dates:  Health Maintenance  Topic Date Due   DTaP/Tdap/Td vaccine (2 - Tdap) 02/06/2018   COVID-19 Vaccine (4 - 2024-25 season) 06/19/2023   DEXA scan (bone density measurement)  12/10/2023   Mammogram  01/13/2024   Medicare Annual Wellness Visit  11/23/2024   Pneumonia Vaccine  Completed   Flu Shot  Completed   Hepatitis C Screening  Completed   Zoster (Shingles) Vaccine  Completed   HPV Vaccine  Aged Out   Colon Cancer Screening  Discontinued    Advanced directives: (Copy Requested) Please bring a copy of your health care power of attorney and living will to the office to be added to your chart at your convenience.  Next Medicare Annual Wellness Visit scheduled for next year: Yes  insert Preventive Care attachment Insert FALL PREVENTION attachment if needed

## 2023-11-24 NOTE — Progress Notes (Signed)
 Subjective:   Nancy Cuevas is a 78 y.o. female who presents for Medicare Annual (Subsequent) preventive examination.  Visit Complete: Virtual I connected with  Nancy Cuevas Sherrine on 11/24/23 by a audio enabled telemedicine application and verified that I am speaking with the correct person using two identifiers. Interactive audio and video telecommunications were attempted between this provider and patient, however failed, due to patient having technical difficulties OR patient did not have access to video capability.  We continued and completed visit with audio only.  Patient Location: Home  Provider Location: Home Office  I discussed the limitations of evaluation and management by telemedicine. The patient expressed understanding and agreed to proceed.  Vital Signs: Because this visit was a virtual/telehealth visit, some criteria may be missing or patient reported. Any vitals not documented were not able to be obtained and vitals that have been documented are patient reported.  Patient Medicare AWV questionnaire was completed by the patient on 11/20/2023; I have confirmed that all information answered by patient is correct and no changes since this date.  Cardiac Risk Factors include: advanced age (>60men, >25 women);dyslipidemia     Objective:    Today's Vitals   There is no height or weight on file to calculate BMI.     11/24/2023    3:09 PM 11/22/2022    3:56 PM 03/18/2022    8:42 AM 11/18/2021    3:59 PM 10/07/2020   10:37 AM 06/05/2018    3:06 PM 06/24/2017    8:31 AM  Advanced Directives  Does Patient Have a Medical Advance Directive? Yes Yes Yes Yes Yes Yes Yes  Type of Estate Agent of [place];Living will Healthcare Power of [place];Living will Healthcare Power of [place];Living will Healthcare Power of [place];Living will Healthcare Power of [place];Living will Healthcare Power of [place];Living will Healthcare Power of [place];Living will  Copy of  Healthcare Power of Attorney in Chart? No - copy requested No - copy requested No - copy requested No - copy requested Yes - validated most recent copy scanned in chart (See row information) Yes No - copy requested    Current Medications (verified) Outpatient Encounter Medications as of 11/24/2023  Medication Sig   acetaminophen  (TYLENOL ) 500 MG tablet Take 500 mg by mouth daily as needed for moderate pain or headache.   Calcium Carb-Cholecalciferol (CALCIUM + VITAMIN D3 PO) Take by mouth.   desonide  (DESOWEN ) 0.05 % cream Apply topically 2 (two) times daily. (Patient taking differently: Apply topically as needed.)   ferrous sulfate 325 (65 FE) MG tablet Take 325 mg by mouth daily.   Flaxseed, Linseed, (FLAX SEED OIL) 1000 MG CAPS Take 1,000 mg by mouth daily. evening   fluticasone  (FLONASE ) 50 MCG/ACT nasal spray Place 2 sprays into both nostrils daily. (Patient taking differently: Place 2 sprays into both nostrils as needed.)   Hypromellose (ARTIFICIAL TEARS OP) Apply 1 drop to eye daily as needed (dry eyes).   levothyroxine  (SYNTHROID ) 75 MCG tablet TAKE 1 TABLET(75 MCG) BY MOUTH DAILY   metoprolol  succinate (TOPROL  XL) 50 MG 24 hr tablet Take 1 tablet (50 mg total) by mouth daily. Take with or immediately following a meal.   Multiple Vitamin (MULTIVITAMIN) tablet Take 1 tablet by mouth daily.   Omega-3 Fatty Acids (FISH OIL) 1000 MG CAPS Take 1 capsule by mouth daily.   omeprazole  (PRILOSEC) 20 MG capsule Take 1 capsule (20 mg total) by mouth daily.   pravastatin  (PRAVACHOL ) 40 MG tablet Take 1  tablet (40 mg total) by mouth daily.   No facility-administered encounter medications on file as of 11/24/2023.    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   Bunion    Cataract    removed from left eye   Contact dermatitis and other eczema,  due to unspecified cause    Contact dermatitis and other eczema, due to unspecified cause    COVID    Oct 2022 coughing tired cold s/s   Disorder of bone and cartilage, unspecified    Female stress incontinence    GERD (gastroesophageal reflux disease)    HYPERLIPIDEMIA 10/27/2010   Hyperpotassemia    HYPOTHYROIDISM 07/18/2007   Nonspecific (abnormal) findings on radiological and other examination of other intrathoracic organs    OSTEOPENIA 03/24/2008   Osteopenia    Osteoporosis    Palpitations    Echo (1/12) EF 55-60%, mild diastolic dysfunction, atrial septal aneurysm. Holter (1/12): short runs of atrial tachycardia versus MAT. ;  Echo (14) EF 55-60%, normal wall motion, mild MR, mild LAE, atrial septal aneurysm   Personal history of colonic and rectal adenomas 03/22/2013   POSTMENOPAUSAL STATUS 02/07/2008   Primary hypertension 11/07/2023   Symptomatic menopausal or female climacteric states    Past Surgical History:  Procedure Laterality Date   ABDOMINAL HYSTERECTOMY  1988   prolapse uterus   BUNIONECTOMY Right 10/2014   CARPAL TUNNEL RELEASE  11/15/2011   Procedure: CARPAL TUNNEL RELEASE;  Surgeon: Franky JONELLE Curia, MD;  Location: [facility];  Service: Orthopedics;  Laterality: Left;   CATARACT EXTRACTION Left 2011   CATARACT EXTRACTION Right 06/24/2023   COLONOSCOPY  [id number]   normal,   COLONOSCOPY WITH PROPOFOL  N/A 06/24/2017   Procedure: COLONOSCOPY WITH PROPOFOL ;  Surgeon: Avram Lupita BRAVO, MD;  Location: [facility];  Service: Endoscopy;  Laterality: N/A;   EYE SURGERY     FRACTURE SURGERY  2006   HAND SURGERY  oct 2012 and jan 2013   Dr Dean--[facility]   HOT HEMOSTASIS N/A 06/24/2017   Procedure: HOT HEMOSTASIS (ARGON PLASMA COAGULATION/BICAP);  Surgeon: Avram Lupita BRAVO, MD;  Location: [facility];  Service: Endoscopy;  Laterality: N/A;   left wrist hardware removal 10/12  07/2011   OPEN REDUCTION INTERNAL FIXATION (ORIF) DISTAL RADIAL FRACTURE  Left 03/2005   left wrist   PARTIAL PROCTECTOMY BY TEM  06/22/2013   Procedure: PARTIAL PROCTECTOMY BY TEM;  Surgeon: Bernarda Ned, MD;  Location: [facility];  Service: General;;   POLYPECTOMY     RETINAL DETACHMENT SURGERY  2009   left   TRANSANAL EXCISION OF RECTAL MASS N/A 03/18/2022   Procedure: TRANSANAL EXCISION RECTAL POLYP;  Surgeon: Ned Bernarda, MD;  Location: [facility];  Service: General;  Laterality: N/A;   Family History  Problem Relation Age of Onset   Diabetes Mother        1st degree relative   Hypertension Mother    Stroke Mother    Heart disease Father    Cancer Maternal Aunt    Heart disease Maternal Uncle    ALS Paternal Aunt    Diabetes Paternal Uncle    Heart disease Maternal Grandfather    Heart disease Paternal Grandfather    Cancer Cousin    Heart disease Cousin    Alzheimer's disease Cousin  Osteoporosis Other        1st degree relative   Colon cancer Neg Hx    Esophageal cancer Neg Hx    Stomach cancer Neg Hx    Rectal cancer Neg Hx    Colon polyps Neg Hx    Crohn's disease Neg Hx    Social History   Socioeconomic History   Marital status: Married    Spouse name: Not on file   Number of children: 2   Years of education: Not on file   Highest education level: Master's degree (e.g., MA, MS, MEng, MEd, MSW, MBA)  Occupational History   Occupation: Retired  Tobacco Use   Smoking status: Never    Passive exposure: Past (years ago 15 years ago when husband smokked)   Smokeless tobacco: Never  Vaping Use   Vaping status: Never Used  Substance and Sexual Activity   Alcohol use: No   Drug use: No   Sexual activity: Not Currently    Birth control/protection: None  Other Topics Concern   Not on file  Social History Narrative   Married one son one daughter and 3 grandchildren    Likes to travel   No alcohol tobacco or drug use reported   Social Drivers of Corporate Investment Banker Strain: Low Risk  (11/24/2023)    Overall Financial Resource Strain (CARDIA)    Difficulty of Paying Living Expenses: Not hard at all  Food Insecurity: No Food Insecurity (11/24/2023)   Hunger Vital Sign    Worried About Running Out of Food in the Last Year: Never true    Ran Out of Food in the Last Year: Never true  Transportation Needs: No Transportation Needs (11/24/2023)   PRAPARE - Administrator, Civil Service (Medical): No    Lack of Transportation (Non-Medical): No  Physical Activity: Sufficiently Active (11/24/2023)   Exercise Vital Sign    Days of Exercise per Week: 5 days    Minutes of Exercise per Session: 30 min  Recent Concern: Physical Activity - Insufficiently Active (11/04/2023)   Exercise Vital Sign    Days of Exercise per Week: 5 days    Minutes of Exercise per Session: 20 min  Stress: No Stress Concern Present (11/24/2023)   [employer] of Occupational Health - Occupational Stress Questionnaire    Feeling of Stress : Not at all  Social Connections: Socially Integrated (11/24/2023)   Social Connection and Isolation Panel [NHANES]    Frequency of Communication with Friends and Family: More than three times a week    Frequency of Social Gatherings with Friends and Family: More than three times a week    Attends Religious Services: More than 4 times per year    Active Member of [employer] or Organizations: Yes    Attends Engineer, Structural: More than 4 times per year    Marital Status: Married    Tobacco Counseling Counseling given: Not Answered   Clinical Intake:  Pre-visit preparation completed: Yes  Pain : No/denies pain     Nutritional Risks: None Diabetes: No  How often do you need to have someone help you when you read instructions, pamphlets, or other written materials from your doctor or pharmacy?: 1 - Never  Interpreter Needed?: No  Information entered by :: NAllen LPN   Activities of Daily Living    11/20/2023    5:54 PM  In your present state of health, do  you have any difficulty performing the following activities:  Hearing? 0  Vision? 0  Difficulty concentrating or making decisions? 0  Walking or climbing stairs? 0  Dressing or bathing? 0  Doing errands, shopping? 0  Preparing Food and eating ? N  Using the Toilet? N  In the past six months, have you accidently leaked urine? N  Do you have problems with loss of bowel control? N  Managing your Medications? N  Managing your Finances? N  Housekeeping or managing your Housekeeping? N    Patient Care Team: Thedora Garnette HERO, MD as PCP - General (Family Medicine) Michele Richardson, DO as PCP - Cardiology (Cardiology) Avram Lupita BRAVO, MD as Consulting Physician (Gastroenterology) Patrcia Sharper, MD as Consulting Physician (Ophthalmology) Rolan Ezra RAMAN, MD as Consulting Physician (Cardiology) Jolynn Chou, DDS as Consulting Physician (Dentistry) Patty Anes, MD as Consulting Physician  Indicate any recent Medical Services you may have received from other than [facility] providers in the past year (date may be approximate).     Assessment:   This is a routine wellness examination for [facility].  Hearing/Vision screen Hearing Screening - Comments:: Denies hearing issues Vision Screening - Comments:: Regular eye exams, [facility]   Goals Addressed             This Visit's Progress    Patient Stated       11/24/2023, wants to keep bursitis under control       Depression Screen    11/24/2023    3:10 PM 07/06/2023    9:29 AM 11/22/2022    3:56 PM 06/03/2022   11:06 AM 11/18/2021    4:27 PM 10/07/2020   10:39 AM 09/05/2019    9:36 AM  PHQ 2/9 Scores  PHQ - 2 Score 0 0 0 0 0 0 0  PHQ- 9 Score 0          Fall Risk    11/20/2023    5:54 PM 07/06/2023    9:29 AM 11/22/2022    3:56 PM 06/03/2022   11:06 AM 11/18/2021    4:29 PM  Fall Risk   Falls in the past year? 0 0 0 0 0  Number falls in past yr: 0 0 0 0 0  Injury with Fall? 0 0 0 0 0  Risk for fall due to : Medication side effect  No Fall Risks Medication side effect  No Fall Risks  Follow up Falls prevention discussed;Falls evaluation completed Falls evaluation completed Falls prevention discussed;Education provided;Falls evaluation completed  Falls prevention discussed    MEDICARE RISK AT HOME: Medicare Risk at Home Any stairs in or around the home?: (Patient-Rptd) Yes If so, are there any without handrails?: (Patient-Rptd) No Home free of loose throw rugs in walkways, pet beds, electrical cords, etc?: (Patient-Rptd) Yes Adequate lighting in your home to reduce risk of falls?: (Patient-Rptd) Yes Life alert?: (Patient-Rptd) No Use of a cane, walker or w/c?: (Patient-Rptd) No Grab bars in the bathroom?: (Patient-Rptd) Yes Shower chair or bench in shower?: (Patient-Rptd) Yes Elevated toilet seat or a handicapped toilet?: (Patient-Rptd) Yes  TIMED UP AND GO:  Was the test performed?  No    Cognitive Function:    03/17/2017   10:06 AM  MMSE - Mini Mental State Exam  Orientation to time 5  Orientation to Place 5  Registration 3  Attention/ Calculation 5  Recall 3  Language- name 2 objects 2  Language- repeat 1  Language- follow 3 step command 3  Language- read & follow direction 1  Write a sentence 1  Copy design 1  Total score 30        11/24/2023    3:11 PM 11/22/2022    3:57 PM  6CIT Screen  What Year? 0 points 0 points  What month? 0 points 0 points  What time? 0 points 0 points  Count back from 20 0 points 0 points  Months in reverse 0 points 0 points  Repeat phrase 0 points 0 points  Total Score 0 points 0 points    Immunizations Immunization History  Administered Date(s) Administered   Fluad Quad(high Dose 65+) 06/22/2019   Fluad Trivalent(High Dose 65+) 07/06/2023   Influenza Split 09/17/2011   Influenza Whole 08/30/2007, 07/31/2008, 09/02/2010, 09/01/2012   Influenza, High Dose Seasonal PF 07/11/2013, 07/31/2014, 07/22/2015, 07/19/2016, 07/09/2017   Influenza-Unspecified 06/18/2020,  07/06/2021   PFIZER(Purple Top)SARS-COV-2 Vaccination 11/20/2019, 12/11/2019, 09/01/2020   Pneumococcal Conjugate-13 07/31/2014   Pneumococcal Polysaccharide-23 09/21/2010, 07/19/2016   Td 02/07/2008   Zoster Recombinant(Shingrix) 08/06/2019, 10/15/2019   Zoster, Live 02/07/2008    TDAP status: Due, Education has been provided regarding the importance of this vaccine. Advised may receive this vaccine at local pharmacy or Health Dept. Aware to provide a copy of the vaccination record if obtained from local pharmacy or Health Dept. Verbalized acceptance and understanding.  Flu Vaccine status: Up to date  Pneumococcal vaccine status: Up to date  Covid-19 vaccine status: Information provided on how to obtain vaccines.   Qualifies for Shingles Vaccine? Yes   Zostavax completed Yes   Shingrix Completed?: Yes  Screening Tests Health Maintenance  Topic Date Due   DTaP/Tdap/Td (2 - Tdap) 02/06/2018   COVID-19 Vaccine (4 - 2024-25 season) 06/19/2023   DEXA SCAN  12/10/2023   MAMMOGRAM  01/13/2024   Medicare Annual Wellness (AWV)  11/23/2024   Pneumonia Vaccine 92+ Years old  Completed   INFLUENZA VACCINE  Completed   Hepatitis C Screening  Completed   Zoster Vaccines- Shingrix  Completed   HPV VACCINES  Aged Out   Colonoscopy  Discontinued    Health Maintenance  Health Maintenance Due  Topic Date Due   DTaP/Tdap/Td (2 - Tdap) 02/06/2018   COVID-19 Vaccine (4 - 2024-25 season) 06/19/2023    Colorectal cancer screening: No longer required.   Mammogram status: Completed 01/13/2023. Repeat every year  Bone Density status: Completed 12/09/2021.   Lung Cancer Screening: (Low Dose CT Chest recommended if Age 51-80 years, 20 pack-year currently smoking OR have quit w/in 15years.) does not qualify.   Lung Cancer Screening Referral: no  Additional Screening:  Hepatitis C Screening: does qualify; Completed 10/24/2015  Vision Screening: Recommended annual ophthalmology exams for  early detection of glaucoma and other disorders of the eye. Is the patient up to date with their annual eye exam?  Yes  Who is the provider or what is the name of the office in which the patient attends annual eye exams? [facility] If pt is not established with a provider, would they like to be referred to a provider to establish care? No .   Dental Screening: Recommended annual dental exams for proper oral hygiene  Diabetic Foot Exam: n/a  Community Resource Referral / Chronic Care Management: CRR required this visit?  No   CCM required this visit?  No     Plan:     I have personally reviewed and noted the following in the patient's chart:   Medical and social history Use of alcohol, tobacco or illicit drugs  Current medications and supplements including opioid prescriptions.  Patient is not currently taking opioid prescriptions. Functional ability and status Nutritional status Physical activity Advanced directives List of other physicians Hospitalizations, surgeries, and ER visits in previous 12 months Vitals Screenings to include cognitive, depression, and falls Referrals and appointments  In addition, I have reviewed and discussed with patient certain preventive protocols, quality metrics, and best practice recommendations. A written personalized care plan for preventive services as well as general preventive health recommendations were provided to patient.     Ardella FORBES Dawn, LPN   04/19/7973   After Visit Summary: (MyChart) Due to this being a telephonic visit, the after visit summary with patients personalized plan was offered to patient via MyChart   Nurse Notes: none

## 2023-12-01 NOTE — Progress Notes (Signed)
 Cardiology Office Note:  .   Date:  12/13/2023  ID:  Nancy Cuevas, Nancy Cuevas [birth year], MRN [id number] PCP: Nancy Mast, MD  [facility] Providers Cardiologist:  Nancy Lerner, DO    History of Present Illness: Nancy Cuevas Kitchen   Nancy Cuevas is a 78 y.o. female with history of HLD, atrial tachycardia.   Patient saw Dr. Odis Cuevas 08/2023 with increase palpitations after stopping metoprolol. It was restarted and Zio ordered. BP up and told to check regularly at home. Zio showed SVT and metoprolol changed to toprol xl. Echo normal LVEF, elevated LA pressure, mild AI and mild MR.  Patient says she's doing better. She has occasional palpitations maybe every 3 days, doesn't have to stop what she's doing. She stopped checking her BP everyday because it was stressing her out. BP good today. She has energy. She takes line dance lessons, walks the dog, works in her garden.    ROS:    Studies Reviewed: Nancy Cuevas Kitchen         Prior CV Studies: LONG TERM MONITOR (8-14 DAYS) HOOK UP AND INTERPRETATION 10/06/2023  Narrative Cardiac monitor (Zio Patch): 09/13/2023 - 09/27/2023 Dominant rhythm sinus. Heart rate 43-222 bpm.  Avg HR 66 bpm. No atrial fibrillation detected during the monitoring period. No ventricular tachycardia, high grade AV block, pauses (3 seconds or longer). Total supraventricular ectopic burden <1%. Episodes of supraventricular tachycardia: Fastest 74 beats, 23.3 seconds, average HR 192 bpm, max HR 222 bpm -suggestive of AVNRT physiology. Total ventricular ectopic burden <1%. Patient triggered events: 10. Patient triggered events illustrated predominantly sinus tachycardia with also episodes of PSVT initiation.     Echo 10/2023 IMPRESSIONS     1. Left ventricular ejection fraction, by estimation, is 55 to 60%. Left  ventricular ejection fraction by 3D volume is 54 %. The left ventricle has  normal function. The left ventricle has no regional wall motion  abnormalities. Left ventricular  diastolic   parameters are indeterminate. The average left ventricular global  longitudinal strain is -19.5 %. The global longitudinal strain is normal.   2. Right ventricular systolic function is normal. The right ventricular  size is normal. There is normal pulmonary artery systolic pressure. The  estimated right ventricular systolic pressure is 18.8 mmHg.   3. Left atrial size was mildly dilated.   4. 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   5. The mitral valve is degenerative. Mild mitral valve regurgitation. No  evidence of mitral stenosis.   6. The aortic valve is tricuspid. There is mild calcification of the  aortic valve. There is mild thickening of the aortic valve. Aortic valve  regurgitation is mild. No aortic stenosis is present.   7. The inferior vena cava is normal in size with greater than 50%  respiratory variability, suggesting right atrial pressure of 3 mmHg.    Risk Assessment/Calculations:             Physical Exam:   VS:  BP 120/60   Pulse (!) 55   Ht 5\' 3"  (1.6 m)   Wt 132 lb 6.4 oz (60.1 kg)   SpO2 97%   BMI 23.45 kg/m    Wt Readings from Last 3 Encounters:  12/13/23 132 lb 6.4 oz (60.1 kg)  11/22/23 133 lb (60.3 kg)  11/07/23 131 lb 12.8 oz (59.8 kg)    GEN: Well nourished, well developed in no acute distress NECK: No JVD; No carotid bruits CARDIAC:  RRR, 1-2/6 systolic murmur LSB RESPIRATORY:  Clear to auscultation without rales, wheezing or rhonchi  ABDOMEN: Soft, non-tender, non-distended EXTREMITIES:  No edema; No deformity   ASSESSMENT AND PLAN: .    Atrial tachycardia/SVT on Toprol XL-doing much better on current meds. No changes. 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

## 2023-12-08 ENCOUNTER — Other Ambulatory Visit: Payer: Self-pay | Admitting: Family Medicine

## 2023-12-08 DIAGNOSIS — E039 Hypothyroidism, unspecified: Secondary | ICD-10-CM

## 2023-12-08 DIAGNOSIS — E785 Hyperlipidemia, unspecified: Secondary | ICD-10-CM

## 2023-12-13 ENCOUNTER — Ambulatory Visit: Payer: Medicare PPO | Attending: Physician Assistant | Admitting: Physician Assistant

## 2023-12-13 ENCOUNTER — Encounter: Payer: Self-pay | Admitting: Physician Assistant

## 2023-12-13 VITALS — BP 120/60 | HR 55 | Ht 63.0 in | Wt 132.4 lb

## 2023-12-13 DIAGNOSIS — I471 Supraventricular tachycardia, unspecified: Secondary | ICD-10-CM

## 2023-12-13 DIAGNOSIS — I340 Nonrheumatic mitral (valve) insufficiency: Secondary | ICD-10-CM | POA: Diagnosis not present

## 2023-12-13 DIAGNOSIS — E782 Mixed hyperlipidemia: Secondary | ICD-10-CM

## 2023-12-13 DIAGNOSIS — E039 Hypothyroidism, unspecified: Secondary | ICD-10-CM

## 2023-12-13 DIAGNOSIS — I10 Essential (primary) hypertension: Secondary | ICD-10-CM

## 2023-12-13 NOTE — Patient Instructions (Signed)
 Medication Instructions:  No changes  *If you need a refill on your cardiac medications before your next appointment, please call your pharmacy*   Lab Work: none If you have labs (blood work) drawn today and your tests are completely normal, you will receive your results only by: MyChart Message (if you have MyChart) OR A paper copy in the mail If you have any lab test that is abnormal or we need to change your treatment, we will call you to review the results.   Testing/Procedures: none   Follow-Up: At [facility], you and your health needs are our priority.  As part of our continuing mission to provide you with exceptional heart care, we have created designated Provider Care Teams.  These Care Teams include your primary Cardiologist (physician) and Advanced Practice Providers (APPs -  Physician Assistants and Nurse Practitioners) who all work together to provide you with the care you need, when you need it.  Your next appointment:   12 month(s)  Provider:   Tessa Lerner, DO

## 2023-12-19 ENCOUNTER — Other Ambulatory Visit: Payer: Self-pay | Admitting: Family Medicine

## 2023-12-19 DIAGNOSIS — Z1231 Encounter for screening mammogram for malignant neoplasm of breast: Secondary | ICD-10-CM

## 2023-12-22 ENCOUNTER — Other Ambulatory Visit: Payer: Self-pay

## 2023-12-22 MED ORDER — METOPROLOL SUCCINATE ER 50 MG PO TB24: 50.0000 mg | ORAL_TABLET | Freq: Every day | ORAL | 3 refills | Status: AC

## 2024-01-16 ENCOUNTER — Ambulatory Visit

## 2024-01-23 ENCOUNTER — Ambulatory Visit
Admission: RE | Admit: 2024-01-23 | Discharge: 2024-01-23 | Disposition: A | Discharge status: Home / Self Care | Source: Ambulatory Visit | Attending: Family Medicine | Admitting: Family Medicine

## 2024-01-23 ENCOUNTER — Other Ambulatory Visit: Payer: Self-pay | Admitting: Family Medicine

## 2024-01-23 DIAGNOSIS — Z1231 Encounter for screening mammogram for malignant neoplasm of breast: Secondary | ICD-10-CM

## 2024-01-23 DIAGNOSIS — R1310 Dysphagia, unspecified: Secondary | ICD-10-CM

## 2024-07-09 ENCOUNTER — Ambulatory Visit (INDEPENDENT_AMBULATORY_CARE_PROVIDER_SITE_OTHER): Payer: Medicare PPO | Admitting: Family Medicine

## 2024-07-09 ENCOUNTER — Ambulatory Visit: Payer: Self-pay | Admitting: Family Medicine

## 2024-07-09 ENCOUNTER — Encounter: Payer: Self-pay | Admitting: Family Medicine

## 2024-07-09 VITALS — BP 118/74 | HR 83 | Temp 97.8°F | Ht 63.0 in | Wt 129.0 lb

## 2024-07-09 DIAGNOSIS — M85852 Other specified disorders of bone density and structure, left thigh: Secondary | ICD-10-CM | POA: Diagnosis not present

## 2024-07-09 DIAGNOSIS — E039 Hypothyroidism, unspecified: Secondary | ICD-10-CM | POA: Diagnosis not present

## 2024-07-09 DIAGNOSIS — I471 Supraventricular tachycardia, unspecified: Secondary | ICD-10-CM | POA: Diagnosis not present

## 2024-07-09 DIAGNOSIS — Z23 Encounter for immunization: Secondary | ICD-10-CM

## 2024-07-09 DIAGNOSIS — E785 Hyperlipidemia, unspecified: Secondary | ICD-10-CM

## 2024-07-09 DIAGNOSIS — Z0000 Encounter for general adult medical examination without abnormal findings: Secondary | ICD-10-CM | POA: Diagnosis not present

## 2024-07-09 DIAGNOSIS — M19042 Primary osteoarthritis, left hand: Secondary | ICD-10-CM | POA: Insufficient documentation

## 2024-07-09 LAB — COMPREHENSIVE METABOLIC PANEL WITH GFR
ALT: 24 U/L (ref 0–35)
AST: 27 U/L (ref 0–37)
Albumin: 4.4 g/dL (ref 3.5–5.2)
Alkaline Phosphatase: 62 U/L (ref 39–117)
BUN: 14 mg/dL (ref 6–23)
CO2: 30 meq/L (ref 19–32)
Calcium: 9.7 mg/dL (ref 8.4–10.5)
Chloride: 101 meq/L (ref 96–112)
Creatinine, Ser: 0.67 mg/dL (ref 0.40–1.20)
GFR: 82.75 mL/min (ref >60.00)
Glucose, Bld: 81 mg/dL (ref 70–99)
Potassium: 4.1 meq/L (ref 3.5–5.1)
Sodium: 138 meq/L (ref 135–145)
Total Bilirubin: 0.5 mg/dL (ref 0.2–1.2)
Total Protein: 7.1 g/dL (ref 6.0–8.3)

## 2024-07-09 LAB — LIPID PANEL
Cholesterol: 187 mg/dL (ref 0–200)
HDL: 79.1 mg/dL (ref >39.00)
LDL Cholesterol: 95 mg/dL (ref 0–99)
NonHDL: 108.32
Total CHOL/HDL Ratio: 2
Triglycerides: 65 mg/dL (ref 0.0–149.0)
VLDL: 13 mg/dL (ref 0.0–40.0)

## 2024-07-09 LAB — TSH: TSH: 2.07 u[IU]/mL (ref 0.35–5.50)

## 2024-07-09 NOTE — Assessment & Plan Note (Signed)
 Overall health is very good. Recommend ongoing regular exercise. Discussed recommended screenings and immunizations.

## 2024-07-09 NOTE — Assessment & Plan Note (Signed)
 Over 2 years since last bone density scan. I will order this follow-up today. Continue calcium and Vitamin D  supplementation.

## 2024-07-09 NOTE — Assessment & Plan Note (Signed)
I will check lipids today. Continue pravastatin 40 mg daily.

## 2024-07-09 NOTE — Progress Notes (Signed)
 [facility] [address] [place] [address] Dept: [phone] Dept Fax: [phone]  Annual Physical Visit  Subjective:    Patient ID: [name], female    DOB: 1945/12/20, 78 y.o..   MRN: [id number]  Chief Complaint  Patient presents with   Annual Exam    CPE/labs.   Not fasting today.  C/o swelling in RT knee/ankle off/on.   Flu shot today.    History of Present Illness:  Patient is in today for an annual physical/preventative visit.  Ms. Armas has a history of atrial tachycardia. She is managed on metoprolol  succinate 50 mg daily. She is finding that this regulates her heart better.  Ms. Brickman has a history of hypothyroidism. She is managed on levothyroxine  75 mcg daily.  Ms. Kates has a history of hyperlipidemia. She is managed on pravastatin  40 mg daily.  Review of Systems  Constitutional:  Negative for chills, diaphoresis, fever, malaise/fatigue and weight loss.  HENT:  Negative for congestion, ear pain, hearing loss, sinus pain, sore throat and tinnitus.   Eyes:  Negative for blurred vision, pain, discharge and redness.  Respiratory:  Positive for cough. Negative for shortness of breath and wheezing.        Mild morning cough and at night when lying down. She relates this to post nasla drip form her allergic rhinitis.  Cardiovascular:  Negative for chest pain and palpitations.  Gastrointestinal:  Positive for constipation. Negative for abdominal pain, diarrhea, heartburn, nausea and vomiting.       Has chronic constipation. Uses Miralax  as needed.  Musculoskeletal:  Positive for joint pain. Negative for back pain and myalgias.       Has some intermittent fullness feeling in her right knee. This can be associated with some discomfort.  Skin:  Negative for itching and rash.  Psychiatric/Behavioral:  Negative for depression. The patient is not nervous/anxious.    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   Recurrent rectal polyp 03/05/2022   Allergic rhinitis 09/29/2021   Varicose veins of both lower extremities 08/21/2021   Aortic atherosclerosis (HCC) 08/12/2021   Hiatal hernia 08/12/2021   Hematuria 06/18/2021   PSVT (paroxysmal supraventricular tachycardia) (HCC) 09/04/2019   Stricture and stenosis of esophagus 09/04/2019   Osteopenia 09/04/2019   Annual physical exam 09/04/2019   History of colonic polyps 03/22/2013   Hyperlipidemia 10/27/2010   Female stress incontinence 10/27/2010   Hypothyroidism 07/18/2007   Past Surgical History:  Procedure Laterality Date   ABDOMINAL HYSTERECTOMY  1988   prolapse uterus   BUNIONECTOMY Right 10/2014   CARPAL TUNNEL RELEASE  11/15/2011   Procedure: CARPAL TUNNEL RELEASE;  Surgeon: Franky JONELLE Curia, MD;  Location: [facility];  Service: Orthopedics;  Laterality: Left;   CATARACT EXTRACTION Left 2011   CATARACT EXTRACTION Right 06/24/2023   COLONOSCOPY  [id number]   normal,   COLONOSCOPY WITH PROPOFOL  N/A 06/24/2017   Procedure: COLONOSCOPY WITH PROPOFOL ;  Surgeon: Avram Lupita BRAVO, MD;  Location: [facility];  Service: Endoscopy;  Laterality: N/A;   EYE SURGERY     FRACTURE SURGERY  2006   HAND SURGERY  oct 2012 and jan 2013   Dr Dean--[facility]   HOT HEMOSTASIS N/A 06/24/2017   Procedure: HOT HEMOSTASIS (ARGON PLASMA COAGULATION/BICAP);  Surgeon: Avram Lupita BRAVO, MD;  Location: [facility];  Service: Endoscopy;  Laterality: N/A;   left wrist hardware  removal 10/12  07/2011   OPEN REDUCTION INTERNAL FIXATION (ORIF) DISTAL RADIAL FRACTURE Left 03/2005   left wrist   PARTIAL PROCTECTOMY BY TEM  06/22/2013   Procedure: PARTIAL PROCTECTOMY BY TEM;  Surgeon: Bernarda Ned, MD;  Location: [facility];  Service: General;;   POLYPECTOMY     RETINAL DETACHMENT SURGERY  2009   left   TRANSANAL EXCISION OF RECTAL MASS N/A 03/18/2022   Procedure:  TRANSANAL EXCISION RECTAL POLYP;  Surgeon: Ned Bernarda, MD;  Location: [facility] [place];  Service: General;  Laterality: N/A;   Family History  Problem Relation Age of Onset   Diabetes Mother        1st degree relative   Hypertension Mother    Stroke Mother    Heart disease Father    Cancer Maternal Aunt    Heart disease Maternal Uncle    ALS Paternal Aunt    Diabetes Paternal Uncle    Heart disease Maternal Grandfather    Heart disease Paternal Grandfather    Cancer Cousin    Heart disease Cousin    Alzheimer's disease Cousin    Osteoporosis Other        1st degree relative   Colon cancer Neg Hx    Esophageal cancer Neg Hx    Stomach cancer Neg Hx    Rectal cancer Neg Hx    Colon polyps Neg Hx    Crohn's disease Neg Hx    Outpatient Medications Prior to Visit  Medication Sig Dispense Refill   acetaminophen  (TYLENOL ) 500 MG tablet Take 500 mg by mouth daily as needed for moderate pain or headache.     Calcium Carb-Cholecalciferol (CALCIUM + VITAMIN D3 PO) Take by mouth.     desonide  (DESOWEN ) 0.05 % cream Apply topically 2 (two) times daily. 30 g 0   ferrous sulfate 325 (65 FE) MG tablet Take 325 mg by mouth daily.     Flaxseed, Linseed, (FLAX SEED OIL) 1000 MG CAPS Take 1,000 mg by mouth daily. evening     fluticasone  (FLONASE ) 50 MCG/ACT nasal spray Place 2 sprays into both nostrils daily. 16 g 1   Hypromellose (ARTIFICIAL TEARS OP) Apply 1 drop to eye daily as needed (dry eyes).     levothyroxine  (SYNTHROID ) 75 MCG tablet TAKE 1 TABLET EVERY DAY 90 tablet 3   metoprolol  succinate (TOPROL  XL) 50 MG 24 hr tablet Take 1 tablet (50 mg total) by mouth daily. Take with or immediately following a meal. 90 tablet 3   Multiple Vitamin (MULTIVITAMIN) tablet Take 1 tablet by mouth daily.     Omega-3 Fatty Acids (FISH OIL) 1000 MG CAPS Take 1 capsule by mouth daily.     omeprazole  (PRILOSEC) 20 MG capsule TAKE 1 CAPSULE EVERY DAY 90 capsule 3   pravastatin  (PRAVACHOL ) 40  MG tablet TAKE 1 TABLET EVERY DAY 90 tablet 3   No facility-administered medications prior to visit.   Allergies  Allergen Reactions   Sulfonamide Derivatives Rash   Objective:   Today's Vitals   07/09/24 0852  BP: 118/74  Pulse: 83  Temp: 97.8 F (36.6 C)  TempSrc: Temporal  SpO2: 97%  Weight: 129 lb (58.5 kg)  Height: 5' 3 (1.6 m)   Body mass index is 22.85 kg/m.   General: Well developed, well nourished. No acute distress. HEENT: Normocephalic, non-traumatic. PERRL, EOMI. Conjunctiva clear. External ears   normal. Wax in right EAC removed with curette. TMs normal bilaterally. Nose    clear without congestion or  rhinorrhea. Mucous membranes moist. Oropharynx clear.   Good dentition. Neck: Supple. No lymphadenopathy. No thyromegaly. Lungs: Clear to auscultation bilaterally. No wheezing, rales or rhonchi. CV: RRR without murmurs or rubs. Pulses 2+ bilaterally. Abdomen: Soft, non-tender. Bowel sounds positive, normal pitch and frequency. No   hepatosplenomegaly. No rebound or guarding. Extremities: Full ROM. No joint swelling or tenderness. No edema noted. Skin: Warm and dry. No rashes. Psych: Alert and oriented. Normal mood and affect.  Health Maintenance Due  Topic Date Due   DTaP/Tdap/Td (2 - Tdap) 02/06/2018   DEXA SCAN  12/10/2023   Influenza Vaccine  05/18/2024     Assessment & Plan:   Problem List Items Addressed This Visit       Cardiovascular and Mediastinum   PSVT (paroxysmal supraventricular tachycardia) (HCC)   Stable. Continue metoprolol  succinate 50 mg daily.        Endocrine   Hypothyroidism   I will check a TSH today. Continue levothyroxine  75 mcg daily.      Relevant Orders   TSH     Musculoskeletal and Integument   Osteopenia   Over 2 years since last bone density scan. I will order this follow-up today. Continue calcium and Vitamin D  supplementation.      Relevant Orders   DG Bone Density     Other   Annual physical exam -  Primary   Overall health is very good. Recommend ongoing regular exercise. Discussed recommended screenings and immunizations.       Hyperlipidemia   I will check lipids today. Continue pravastatin  40 mg daily.      Relevant Orders   Lipid panel   Comprehensive metabolic panel with GFR   Other Visit Diagnoses       Need for immunization against influenza       Relevant Orders   Flu vaccine HIGH DOSE PF(Fluzone Trivalent) (Completed)       Return in about 1 year (around 07/09/2025) for Annual preventative care.   Garnette CHRISTELLA Simpler, MD

## 2024-07-09 NOTE — Assessment & Plan Note (Signed)
Stable.  Continue metoprolol succinate 50 mg daily.

## 2024-07-09 NOTE — Assessment & Plan Note (Signed)
 I will check a TSH today. Continue levothyroxine  75 mcg daily.

## 2024-08-09 DIAGNOSIS — H04123 Dry eye syndrome of bilateral lacrimal glands: Secondary | ICD-10-CM | POA: Diagnosis not present

## 2024-08-09 DIAGNOSIS — H524 Presbyopia: Secondary | ICD-10-CM | POA: Diagnosis not present

## 2024-08-09 DIAGNOSIS — H43811 Vitreous degeneration, right eye: Secondary | ICD-10-CM | POA: Diagnosis not present

## 2024-08-09 DIAGNOSIS — H52203 Unspecified astigmatism, bilateral: Secondary | ICD-10-CM | POA: Diagnosis not present

## 2024-08-09 DIAGNOSIS — H35371 Puckering of macula, right eye: Secondary | ICD-10-CM | POA: Diagnosis not present

## 2024-08-09 DIAGNOSIS — H18593 Other hereditary corneal dystrophies, bilateral: Secondary | ICD-10-CM | POA: Diagnosis not present

## 2024-10-10 ENCOUNTER — Other Ambulatory Visit (HOSPITAL_BASED_OUTPATIENT_CLINIC_OR_DEPARTMENT_OTHER)

## 2024-10-17 ENCOUNTER — Other Ambulatory Visit (HOSPITAL_BASED_OUTPATIENT_CLINIC_OR_DEPARTMENT_OTHER)

## 2024-10-29 ENCOUNTER — Other Ambulatory Visit: Payer: Self-pay | Admitting: Family Medicine

## 2024-10-29 DIAGNOSIS — E039 Hypothyroidism, unspecified: Secondary | ICD-10-CM

## 2024-10-29 DIAGNOSIS — E785 Hyperlipidemia, unspecified: Secondary | ICD-10-CM

## 2024-11-01 ENCOUNTER — Ambulatory Visit (HOSPITAL_BASED_OUTPATIENT_CLINIC_OR_DEPARTMENT_OTHER)
Admission: RE | Admit: 2024-11-01 | Discharge: 2024-11-01 | Disposition: A | Discharge status: Home / Self Care | Source: Ambulatory Visit | Attending: Family Medicine | Admitting: Family Medicine

## 2024-11-01 DIAGNOSIS — M85852 Other specified disorders of bone density and structure, left thigh: Secondary | ICD-10-CM | POA: Insufficient documentation

## 2024-11-05 ENCOUNTER — Encounter: Payer: Self-pay | Admitting: Family Medicine

## 2024-11-26 ENCOUNTER — Ambulatory Visit: Payer: Medicare PPO

## 2024-12-07 ENCOUNTER — Ambulatory Visit: Admitting: Cardiology

## 2025-01-08 ENCOUNTER — Ambulatory Visit
